# Patient Record
Sex: Female | Born: 1965
Health system: Southern US, Community
[De-identification: ages and names within clinical notes are randomized; demographics above are authoritative.]

## PROBLEM LIST (undated history)

## (undated) DIAGNOSIS — N920 Excessive and frequent menstruation with regular cycle: Secondary | ICD-10-CM

## (undated) DIAGNOSIS — T4145XA Adverse effect of unspecified anesthetic, initial encounter: Secondary | ICD-10-CM

## (undated) DIAGNOSIS — F419 Anxiety disorder, unspecified: Secondary | ICD-10-CM

## (undated) DIAGNOSIS — F329 Major depressive disorder, single episode, unspecified: Secondary | ICD-10-CM

## (undated) DIAGNOSIS — T8859XA Other complications of anesthesia, initial encounter: Secondary | ICD-10-CM

## (undated) DIAGNOSIS — G709 Myoneural disorder, unspecified: Secondary | ICD-10-CM

## (undated) DIAGNOSIS — N6081 Other benign mammary dysplasias of right breast: Secondary | ICD-10-CM

## (undated) DIAGNOSIS — D649 Anemia, unspecified: Secondary | ICD-10-CM

## (undated) DIAGNOSIS — O09293 Supervision of pregnancy with other poor reproductive or obstetric history, third trimester: Secondary | ICD-10-CM

## (undated) DIAGNOSIS — F32A Depression, unspecified: Secondary | ICD-10-CM

## (undated) DIAGNOSIS — Z0000 Encounter for general adult medical examination without abnormal findings: Secondary | ICD-10-CM

## (undated) DIAGNOSIS — M1990 Unspecified osteoarthritis, unspecified site: Secondary | ICD-10-CM

## (undated) DIAGNOSIS — E039 Hypothyroidism, unspecified: Secondary | ICD-10-CM

## (undated) DIAGNOSIS — R030 Elevated blood-pressure reading, without diagnosis of hypertension: Secondary | ICD-10-CM

## (undated) DIAGNOSIS — R946 Abnormal results of thyroid function studies: Secondary | ICD-10-CM

## (undated) DIAGNOSIS — N6310 Unspecified lump in the right breast, unspecified quadrant: Secondary | ICD-10-CM

## (undated) DIAGNOSIS — I493 Ventricular premature depolarization: Secondary | ICD-10-CM

## (undated) DIAGNOSIS — R011 Cardiac murmur, unspecified: Secondary | ICD-10-CM

## (undated) DIAGNOSIS — M5090 Cervical disc disorder, unspecified, unspecified cervical region: Secondary | ICD-10-CM

## (undated) DIAGNOSIS — K219 Gastro-esophageal reflux disease without esophagitis: Secondary | ICD-10-CM

## (undated) DIAGNOSIS — I10 Essential (primary) hypertension: Secondary | ICD-10-CM

## (undated) DIAGNOSIS — R9431 Abnormal electrocardiogram [ECG] [EKG]: Secondary | ICD-10-CM

## (undated) DIAGNOSIS — Z8616 Personal history of COVID-19: Secondary | ICD-10-CM

## (undated) DIAGNOSIS — I471 Supraventricular tachycardia: Secondary | ICD-10-CM

## (undated) DIAGNOSIS — E079 Disorder of thyroid, unspecified: Secondary | ICD-10-CM

## (undated) DIAGNOSIS — I4719 Other supraventricular tachycardia: Secondary | ICD-10-CM

## (undated) DIAGNOSIS — R42 Dizziness and giddiness: Secondary | ICD-10-CM

## (undated) DIAGNOSIS — D126 Benign neoplasm of colon, unspecified: Secondary | ICD-10-CM

## (undated) DIAGNOSIS — I491 Atrial premature depolarization: Secondary | ICD-10-CM

## (undated) DIAGNOSIS — N959 Unspecified menopausal and perimenopausal disorder: Secondary | ICD-10-CM

## (undated) HISTORY — DX: Hypothyroidism, unspecified: E03.9

## (undated) HISTORY — DX: Abnormal electrocardiogram (ECG) (EKG): R94.31

## (undated) HISTORY — DX: Dizziness and giddiness: R42

## (undated) HISTORY — PX: BLADDER REPAIR: SHX76

## (undated) HISTORY — DX: Abnormal results of thyroid function studies: R94.6

## (undated) HISTORY — PX: EYE SURGERY: SHX253

## (undated) HISTORY — DX: Excessive and frequent menstruation with regular cycle: N92.0

## (undated) HISTORY — DX: Benign neoplasm of colon, unspecified: D12.6

## (undated) HISTORY — DX: Elevated blood-pressure reading, without diagnosis of hypertension: R03.0

## (undated) HISTORY — DX: Encounter for general adult medical examination without abnormal findings: Z00.00

## (undated) HISTORY — DX: Ventricular premature depolarization: I49.3

## (undated) HISTORY — DX: Unspecified menopausal and perimenopausal disorder: N95.9

## (undated) HISTORY — DX: Disorder of thyroid, unspecified: E07.9

## (undated) HISTORY — DX: Essential (primary) hypertension: I10

## (undated) HISTORY — DX: Other supraventricular tachycardia: I47.19

## (undated) HISTORY — PX: TUBAL LIGATION: SHX77

## (undated) HISTORY — PX: APPENDECTOMY: SHX54

## (undated) HISTORY — DX: Atrial premature depolarization: I49.1

## (undated) HISTORY — DX: Cervical disc disorder, unspecified, unspecified cervical region: M50.90

## (undated) HISTORY — DX: Unspecified lump in the right breast, unspecified quadrant: N63.10

## (undated) HISTORY — DX: Supraventricular tachycardia: I47.1

## (undated) HISTORY — DX: Major depressive disorder, single episode, unspecified: F32.9

## (undated) HISTORY — DX: Other benign mammary dysplasias of right breast: N60.81

---

## 1995-12-21 DIAGNOSIS — O09293 Supervision of pregnancy with other poor reproductive or obstetric history, third trimester: Secondary | ICD-10-CM

## 1995-12-21 HISTORY — DX: Supervision of pregnancy with other poor reproductive or obstetric history, third trimester: O09.293

## 1998-04-17 ENCOUNTER — Inpatient Hospital Stay (HOSPITAL_COMMUNITY): Admission: AD | Admit: 1998-04-17 | Discharge: 1998-04-18 | Payer: Self-pay | Admitting: Obstetrics and Gynecology

## 1998-06-27 ENCOUNTER — Inpatient Hospital Stay (HOSPITAL_COMMUNITY): Admission: AD | Admit: 1998-06-27 | Discharge: 1998-07-01 | Payer: Self-pay | Admitting: *Deleted

## 1998-07-25 ENCOUNTER — Encounter (HOSPITAL_COMMUNITY): Admission: RE | Admit: 1998-07-25 | Discharge: 1998-10-16 | Payer: Self-pay | Admitting: *Deleted

## 1999-04-27 ENCOUNTER — Emergency Department (HOSPITAL_COMMUNITY): Admission: EM | Admit: 1999-04-27 | Discharge: 1999-04-27 | Payer: Self-pay | Admitting: Emergency Medicine

## 1999-04-27 ENCOUNTER — Encounter: Payer: Self-pay | Admitting: Emergency Medicine

## 2000-06-02 ENCOUNTER — Ambulatory Visit (HOSPITAL_COMMUNITY): Admission: RE | Admit: 2000-06-02 | Discharge: 2000-06-03 | Payer: Self-pay | Admitting: *Deleted

## 2000-07-21 ENCOUNTER — Other Ambulatory Visit: Admission: RE | Admit: 2000-07-21 | Discharge: 2000-07-21 | Payer: Self-pay | Admitting: *Deleted

## 2000-10-21 ENCOUNTER — Ambulatory Visit (HOSPITAL_COMMUNITY): Admission: RE | Admit: 2000-10-21 | Discharge: 2000-10-21 | Payer: Self-pay | Admitting: *Deleted

## 2000-10-21 ENCOUNTER — Encounter: Payer: Self-pay | Admitting: *Deleted

## 2002-07-04 ENCOUNTER — Other Ambulatory Visit: Admission: RE | Admit: 2002-07-04 | Discharge: 2002-07-04 | Payer: Self-pay | Admitting: *Deleted

## 2004-09-01 ENCOUNTER — Other Ambulatory Visit: Admission: RE | Admit: 2004-09-01 | Discharge: 2004-09-01 | Payer: Self-pay | Admitting: Obstetrics and Gynecology

## 2004-11-25 ENCOUNTER — Ambulatory Visit: Payer: Self-pay | Admitting: Internal Medicine

## 2004-12-29 ENCOUNTER — Ambulatory Visit: Payer: Self-pay | Admitting: Family Medicine

## 2005-09-21 ENCOUNTER — Ambulatory Visit: Payer: Self-pay | Admitting: Internal Medicine

## 2005-11-12 ENCOUNTER — Ambulatory Visit: Payer: Self-pay | Admitting: Internal Medicine

## 2006-08-18 ENCOUNTER — Ambulatory Visit: Payer: Self-pay | Admitting: Family Medicine

## 2006-08-25 ENCOUNTER — Ambulatory Visit: Payer: Self-pay | Admitting: Internal Medicine

## 2006-11-16 ENCOUNTER — Ambulatory Visit: Payer: Self-pay | Admitting: Internal Medicine

## 2007-02-01 ENCOUNTER — Other Ambulatory Visit: Admission: RE | Admit: 2007-02-01 | Discharge: 2007-02-01 | Payer: Self-pay | Admitting: Gynecology

## 2007-08-02 ENCOUNTER — Ambulatory Visit: Payer: Self-pay | Admitting: Internal Medicine

## 2007-08-10 ENCOUNTER — Encounter (INDEPENDENT_AMBULATORY_CARE_PROVIDER_SITE_OTHER): Payer: Self-pay | Admitting: *Deleted

## 2007-08-10 LAB — CONVERTED CEMR LAB: TSH: 1.16 microintl units/mL (ref 0.35–5.50)

## 2007-11-03 ENCOUNTER — Telehealth: Payer: Self-pay | Admitting: Internal Medicine

## 2008-01-12 ENCOUNTER — Telehealth (INDEPENDENT_AMBULATORY_CARE_PROVIDER_SITE_OTHER): Payer: Self-pay | Admitting: *Deleted

## 2008-04-16 ENCOUNTER — Encounter: Payer: Self-pay | Admitting: Internal Medicine

## 2008-04-26 ENCOUNTER — Telehealth (INDEPENDENT_AMBULATORY_CARE_PROVIDER_SITE_OTHER): Payer: Self-pay | Admitting: *Deleted

## 2008-04-26 ENCOUNTER — Ambulatory Visit: Payer: Self-pay | Admitting: Internal Medicine

## 2009-04-14 ENCOUNTER — Ambulatory Visit: Payer: Self-pay | Admitting: Internal Medicine

## 2009-04-14 DIAGNOSIS — R946 Abnormal results of thyroid function studies: Secondary | ICD-10-CM | POA: Insufficient documentation

## 2009-04-14 DIAGNOSIS — F329 Major depressive disorder, single episode, unspecified: Secondary | ICD-10-CM

## 2009-04-14 DIAGNOSIS — R9431 Abnormal electrocardiogram [ECG] [EKG]: Secondary | ICD-10-CM

## 2009-04-14 DIAGNOSIS — F3289 Other specified depressive episodes: Secondary | ICD-10-CM | POA: Insufficient documentation

## 2009-04-15 ENCOUNTER — Encounter: Payer: Self-pay | Admitting: Internal Medicine

## 2009-04-24 ENCOUNTER — Telehealth: Payer: Self-pay | Admitting: Internal Medicine

## 2010-07-09 ENCOUNTER — Ambulatory Visit: Payer: Self-pay | Admitting: Internal Medicine

## 2011-01-19 NOTE — Assessment & Plan Note (Signed)
Summary: cpx/cbs   Vital Signs:  Patient profile:   45 year old female Height:      61.75 inches Weight:      142.4 pounds BMI:     26.35 Temp:     98.7 degrees F oral Pulse rate:   72 / minute Resp:     14 per minute BP sitting:   128 / 80  (left arm) Cuff size:   regular  Vitals Entered By: Shonna Chock CMA (July 09, 2010 1:23 PM)  Comments Patient refused EKG, states "Im fine, Im healthy, I work out, no problems."   History of Present Illness: Stephanie Gray is here for a physical; she is asymptomatic.  Allergies: 1)  ! Pcn  Past History:  Past Medical History: Depression, Dr [name] Hypothyroidism HELP Syndrome (elevated LFTs, TCP) in context of  pneumonia  with 1st pregnancy, 1987  Past Surgical History: G2 P2 ( C sections); Lasik eye surgery bilaterally ; Appendectomy Tubal ligation & bladder repair  6 months post  partum  Family History: Father: HTN, psoriasis, MI @ 49, DM Mother: HTN, OA, migraines Siblings: sister :depression, migraines, dystonia; MGM: depression, Osteoporosis, MR; PGM: MI in 92s; PGuncle : MI ; PGGF :MI in 79s  Social History: Married Never Smoked Alcohol use-no Regular exercise-yes: manual labor on horsefarm  Review of Systems General:  Denies chills, fever, sleep disorder, and sweats; Weight down 6 # with heart healthy nutritional program. Eyes:  Denies blurring, double vision, and vision loss-both eyes. ENT:  Denies difficulty swallowing, hoarseness, nasal congestion, and sinus pressure. CV:  Denies chest pain or discomfort, leg cramps with exertion, palpitations, shortness of breath with exertion, swelling of feet, and swelling of hands. Resp:  Denies cough, shortness of breath, and wheezing. GI:  Denies abdominal pain, bloody stools, and dark tarry stools; Constipation with dehydration. GU:  Denies discharge, dysuria, and hematuria. MS:  Denies joint pain, joint redness, joint swelling, low back pain, mid back pain,  and thoracic pain; Plantar fasciitis resolved with arch supports. Derm:  Denies changes in nail beds, dryness, hair loss, lesion(s), and rash; Eczema with stress or excess handwashing. Neuro:  Denies numbness and tingling. Psych:  Denies anxiety and depression; Stable with meds. Endo:  Denies cold intolerance, excessive hunger, excessive thirst, excessive urination, and heat intolerance. Heme:  Denies abnormal bruising and bleeding. Allergy:  Denies itching eyes and sneezing.  Physical Exam  General:  well-nourished; alert,appropriate and cooperative throughout examination Head:  Normocephalic and atraumatic without obvious abnormalities.  Eyes:  No corneal or conjunctival inflammation noted. No lid lag .Perrla. Funduscopic exam benign, without hemorrhages, exudates or papilledema.  Ears:  External ear exam shows no significant lesions or deformities.  Otoscopic examination reveals clear canals, tympanic membranes are intact bilaterally without bulging, retraction, inflammation or discharge. Hearing is grossly normal bilaterally. Nose:  External nasal examination shows no deformity or inflammation. Nasal mucosa are pink and moist without lesions or exudates. Mouth:  Oral mucosa and oropharynx without lesions or exudates.  Teeth in good repair. Neck:  No deformities, masses, or tenderness noted. Physiologic asymmetry of thyroid Lungs:  Normal respiratory effort, chest expands symmetrically. Lungs are clear to auscultation, no crackles or wheezes. Heart:  normal rate, regular rhythm, no gallop, no rub, no JVD, no HJR, and grade 1 /6 systolic murmur LSB  Abdomen:  Bowel sounds positive,abdomen soft and non-tender without masses, organomegaly or hernias noted. Genitalia:  Dr Nicholas Lose Msk:  No deformity or scoliosis noted of thoracic  or lumbar spine.   Pulses:  R and L carotid,radial,dorsalis pedis and posterior tibial pulses are full and equal bilaterally Extremities:  No clubbing, cyanosis, edema,  or deformity noted with normal full range of motion of all joints.   Neurologic:  alert & oriented X3 and DTRs symmetrical and normal.   No tremor Skin:  Intact without suspicious lesions or rashes Cervical Nodes:  No lymphadenopathy noted Axillary Nodes:  No palpable lymphadenopathy Psych:  memory intact for recent and remote, normally interactive, and good eye contact.     Impression & Recommendations:  Problem # 1:  ROUTINE GENERAL MEDICAL EXAM@HEALTH  CARE FACL (ICD-V70.0)  Orders: EKG w/ Interpretation (93000)  Problem # 2:  HYPOTHYROIDISM (ICD-244.9)  Her updated medication list for this problem includes:    Synthroid 50 Mcg Tabs (Levothyroxine sodium) .Marland Kitchen... Take 1 tablet everyday-  Problem # 3:  ATHEROSCLEROTIC HEART DISEASE, FAMILY HX (ICD-V17.3)  Orders: EKG w/ Interpretation (93000)  Problem # 4:  NONSPECIFIC ABNORMAL ELECTROCARDIOGRAM (ICD-794.31) PMH of  Problem # 5:  DEPRESSION (ICD-311) as per Dr Nolen Mu Her updated medication list for this problem includes:    Cymbalta 30 Mg Cpep (Duloxetine hcl) .Marland Kitchen... Take one daily  Complete Medication List: 1)  Synthroid 50 Mcg Tabs (Levothyroxine sodium) .... Take 1 tablet everyday- 2)  Cymbalta 30 Mg Cpep (Duloxetine hcl) .... Take one daily  Patient Instructions: 1)  Please schedule fasting labs in 10 weeks: 2)  BMP ; 3)  Hepatic Panel ; 4)   NMR Lipoprofile Lipid Panel ; 5)  TSH ; 6)  CBC w/ Diff. Prescriptions: SYNTHROID 50 MCG  TABS (LEVOTHYROXINE SODIUM) take 1 tablet everyday- Brand medically necessary #90 x 3   Entered and Authorized by:   Marga Melnick MD   Signed by:   Marga Melnick MD on 07/09/2010   Method used:   Faxed to ...       [facility]. [phone]* (retail)       [address]       [place], [place]  [address]       Ph: [phone]       Fax: [phone]   RxID:   [id number]    Immunization History:  Tetanus/Td Immunization History:     Tetanus/Td:  tdap (04/20/2004)

## 2011-02-03 ENCOUNTER — Inpatient Hospital Stay (INDEPENDENT_AMBULATORY_CARE_PROVIDER_SITE_OTHER)
Admission: RE | Admit: 2011-02-03 | Discharge: 2011-02-03 | Disposition: A | Discharge status: Home / Self Care | Payer: BC Managed Care – PPO | Source: Ambulatory Visit | Attending: Family Medicine | Admitting: Family Medicine

## 2011-02-03 DIAGNOSIS — J069 Acute upper respiratory infection, unspecified: Secondary | ICD-10-CM

## 2011-02-03 DIAGNOSIS — J45909 Unspecified asthma, uncomplicated: Secondary | ICD-10-CM

## 2011-02-08 ENCOUNTER — Encounter: Payer: Self-pay | Admitting: Internal Medicine

## 2011-02-08 ENCOUNTER — Ambulatory Visit (INDEPENDENT_AMBULATORY_CARE_PROVIDER_SITE_OTHER): Payer: BC Managed Care – PPO | Admitting: Internal Medicine

## 2011-02-08 DIAGNOSIS — J209 Acute bronchitis, unspecified: Secondary | ICD-10-CM

## 2011-02-13 ENCOUNTER — Inpatient Hospital Stay (INDEPENDENT_AMBULATORY_CARE_PROVIDER_SITE_OTHER)
Admission: RE | Admit: 2011-02-13 | Discharge: 2011-02-13 | Disposition: A | Discharge status: Home / Self Care | Payer: BC Managed Care – PPO | Source: Ambulatory Visit | Attending: Family Medicine | Admitting: Family Medicine

## 2011-02-13 DIAGNOSIS — S058X9A Other injuries of unspecified eye and orbit, initial encounter: Secondary | ICD-10-CM

## 2011-02-16 NOTE — Assessment & Plan Note (Signed)
Summary: COUGH AND CHEST CONGESTION --PH   Vital Signs:  Patient profile:   45 year old female Weight:      147.8 pounds BMI:     27.35 O2 Sat:      98 % Temp:     98.8 degrees F oral Pulse rate:   74 / minute Resp:     14 per minute BP sitting:   134 / 88  (left arm) Cuff size:   large  Vitals Entered By: Shonna Chock CMA (February 08, 2011 1:48 PM) CC: Cough, congestion and wheezing , URI symptoms   CC:  Cough, congestion and wheezing , and URI symptoms.  History of Present Illness:    Onset 02/12 as throat tickling  followed by cough with thick yellow sputum & wheezing as of 02/14. Prednisone & albuterol MDI Rxed 02/14 @ [facility]. She now denies nasal congestion, purulent nasal discharge, and earache.  Associated symptoms include dyspnea and wheezing.  The patient denies fever.  The patient denies frontal  headache, bilateral facial pain, tooth pain, and tender adenopathy.    Current Medications (verified): 1)  Synthroid 50 Mcg  Tabs (Levothyroxine Sodium) .... Take 1 Tablet Everyday**labs Due Now** 2)  Cymbalta 30 Mg  Cpep (Duloxetine Hcl) .... Take One Daily 3)  Proair Hfa 108 (90 Base) Mcg/act Aers (Albuterol Sulfate) .... As Directed  Allergies: 1)  ! Pcn  Physical Exam  General:  well-nourished,in no acute distress; alert,appropriate and cooperative throughout examination Ears:  External ear exam shows no significant lesions or deformities.  Otoscopic examination reveals clear canals, tympanic membranes are intact bilaterally without bulging, retraction, inflammation or discharge. Hearing is grossly normal bilaterally. Nose:  External nasal examination shows no deformity or inflammation. Nasal mucosa are pink and moist without lesions or exudates. Mouth:  Oral mucosa and oropharynx without lesions or exudates.  Teeth in good repair. Lungs:  Normal respiratory effort, chest expands symmetrically. Lungs  : musical rhonchi &  wheezes. Heart:  Normal rate and regular rhythm.  S1 and S2 normal without gallop, murmur, click, rub or other extra sounds. Cervical Nodes:  No lymphadenopathy noted Axillary Nodes:  No palpable lymphadenopathy   Impression & Recommendations:  Problem # 1:  BRONCHITIS-ACUTE (ICD-466.0) RAD component Her updated medication list for this problem includes:    Proair Hfa 108 (90 Base) Mcg/act Aers (Albuterol sulfate) .Marland Kitchen... As directed    Symbicort 160-4.5 Mcg/act Aero (Budesonide-formoterol fumarate) .Marland Kitchen... 1-2 puffs every 12 hrs ; gargle & spit after use    Azithromycin 250 Mg Tabs (Azithromycin) .Marland Kitchen... As per pack    Hydromet 5-1.5 Mg/56ml Syrp (Hydrocodone-homatropine) .Marland Kitchen... 1 tsp every 6 hrs as needed  Complete Medication List: 1)  Synthroid 50 Mcg Tabs (Levothyroxine sodium) .... Take 1 tablet everyday**labs due now** 2)  Cymbalta 30 Mg Cpep (Duloxetine hcl) .... Take one daily 3)  Proair Hfa 108 (90 Base) Mcg/act Aers (Albuterol sulfate) .... As directed 4)  Symbicort 160-4.5 Mcg/act Aero (Budesonide-formoterol fumarate) .Marland Kitchen.. 1-2 puffs every 12 hrs ; gargle & spit after use 5)  Prednisone 20 Mg Tabs (Prednisone) .Marland Kitchen.. 1 two times a day with a meal 6)  Azithromycin 250 Mg Tabs (Azithromycin) .... As per pack 7)  Hydromet 5-1.5 Mg/72ml Syrp (Hydrocodone-homatropine) .Marland Kitchen.. 1 tsp every 6 hrs as needed  Patient Instructions: 1)  Drink as much  NON dairy fluid as you can tolerate for the next few days. Prescriptions: HYDROMET 5-1.5 MG/5ML SYRP (HYDROCODONE-HOMATROPINE) 1 tsp every 6 hrs as needed  #  90 cc x 0   Entered and Authorized by:   Marga Melnick MD   Signed by:   Marga Melnick MD on 02/08/2011   Method used:   Print then Give to Patient   RxID:   [id number] AZITHROMYCIN 250 MG TABS (AZITHROMYCIN) as per pack  #1 x 0   Entered and Authorized by:   Marga Melnick MD   Signed by:   Marga Melnick MD on 02/08/2011   Method used:   Electronically to        [facility]. [phone]* (retail)       [address]       [place], [place]  [address]       Ph: [phone]       Fax: [phone]   RxID:   [id number] PREDNISONE 20 MG TABS (PREDNISONE) 1 two times a day with a meal  #14 x 0   Entered and Authorized by:   Marga Melnick MD   Signed by:   Marga Melnick MD on 02/08/2011   Method used:   Electronically to        [facility]. [phone]* (retail)       [address]       [place], [place]  [address]       Ph: [phone]       Fax: [phone]   RxID:   [id number] SYMBICORT 160-4.5 MCG/ACT AERO (BUDESONIDE-FORMOTEROL FUMARATE) 1-2 puffs every 12 hrs ; gargle & spit after use  #1 x 11   Entered and Authorized by:   Marga Melnick MD   Signed by:   Marga Melnick MD on 02/08/2011   Method used:   Print then Give to Patient   RxID:   [id number] PROAIR HFA 108 (90 BASE) MCG/ACT AERS (ALBUTEROL SULFATE) as directed  #1 x 2   Entered and Authorized by:   Marga Melnick MD   Signed by:   Marga Melnick MD on 02/08/2011   Method used:   Print then Give to Patient   RxID:   [id number]    Orders Added: 1)  Est. Patient Level III [03500]

## 2011-05-07 NOTE — Op Note (Signed)
[facility]  Patient:    Stephanie Gray, Stephanie Gray                 MRN: [id number] Proc. Date: 10/21/00 Adm. Date:  [id number] Disc. Date: [id number] Attending:  Donne Hazel                           Operative Report  PREOPERATIVE DIAGNOSES:       1. Request for permanent, voluntary                                  sterilization.                               2. Pelvic pain.  POSTOPERATIVE DIAGNOSES:      1. Request for permanent, voluntary                                  sterilization.                               2. Pelvic pain.                               3. Severe pelvic adhesions noted.                               4. Incidental cystostomy.  OPERATION/PROCEDURE:          1. Laparoscopy.                               2. Lysis of adhesions.                               3. Bilateral tubal ligation.                               4. Mini laparotomy with repair of cystotomy.  SURGEON:                      Willey Blade, M.D.  ANESTHESIA:                   General endotracheal anesthesia.  ESTIMATED BLOOD LOSS:         Estimated blood loss was 20 cc.  COMPLICATIONS:                Incidental cystostomy occurred at the time of lysis of adhesions.  A mini laparotomy was thus performed and a repair undertaken of the dome of the bladder.  FINDINGS AT SURGERY:          Bilateral tubal ligation was performed and there was a great deal of scarring in the anterior abdominal wall and lower uterine segment from the patients previous cesarean section deliveries.  During lysis of adhesions the bladder was sharply entered and later repaired.  The ovaries were visualized bilaterally and noted to be normal.  The  bowel was briefly visualized and noted to be normal as well.                                The appendix was not visualized.  A Foley catheter was placed at the end of the procedure to be left x 10 days.  DESCRIPTION OF PROCEDURE:     The  patient was taken to the operating room and general endotracheal anesthesia administered.  The patient was placed on the operating room table in the dorsal lithotomy position and the abdomen, perineum, and vagina prepped with Betadine and draped in the usual sterile fashion with sterile drapes.  The bladder was emptied with a red rubber catheter.  Next, a small vertical infraumbilical skin incision was made, through which a Veress needle was inserted atraumatically into the abdominal cavity.  A pneumoperitoneum was then created with two liters of carbon dioxide gas.  The Veress needle was removed and a disposable laparoscopic trocar was inserted atraumatically into the abdominal cavity.  A Hulka tenaculum was placed inside the intrauterine cavity prior to this for uterine manipulation. First, attention was turned to bilateral tubal ligation.  As mentioned, there was a great deal of scar tissue noted between the bladder, anterior abdominal wall, and lower uterine segment.  This was extremely distorted due to her previous cesarean section deliveries.  Next, tubal ligation was carried out with the bipolar Klepinger cautery device.  The left tube was identified and traced to its fimbriated end to ensure its positive identification.  The tube was then grasped approximately 2 cm away from the uterine fundus and cauterized thoroughly and completely, and incorporating some of the mesosalpinx for a distance of about 2.5 cm.  The same procedure was repeated on the right tube.  Due to pelvic pain a second puncture was placed two fingerbreadths above the pubic symphysis and in the midline.  Through this a 5 mm trocar was inserted under direct, continuous laparoscopic guidance.  A probe was placed.  Lysis of adhesions was then attempted to be carried out. This was done with blunt and sharp dissection.  As mentioned, the bladder was entered in the dome in the medical sharply, with lysis of adhesions.  Due  to this, a small mini laparotomy was thus performed.  This was about 4 cm in size.                                A 4 cm incision was made in the patients previous Pfannenstiel incision line in the midline and this was carried down sharply in the usual fashion.  The peritoneum was atraumatically entered.  An approximate 2 cm segment of cystotomy was next repaired in a three layer fashion.  The bladder was visualized and noted to be without further injury. The bladder mucosa was then reapproximated with running stitch of 3-0 chromic suture.  The muscularis was then closed two layers above the mucosal layer with two running stitches of 3-0 Vicryl.  A - was then inserted and infiltrated into the bladder. It was noted to be intact and sealed nicely with closure.  A Foley catheter was then placed.  Attention was then turned to closure.  All gas was allowed to escape from the abdomen of course with the incision.  The umbilical site was closed first with two interrupted sutures of 0 Vicryl on the  muscle fascia and the muscle fascia was then closed with a suprapubic small laparotomy incision with a running stitch of 0 Vicryl as well.  All vaginal instruments were removed.  The subcutaneous tissue was closed with multiple interrupted sutures of 0 Vicryl as well.  The skin was reapproximated with Dermabond glue.                                An indwelling catheter was placed.  Also at the time of cystotomy an antibiotic was given.  The patient was then awakened, extubated, and taken to the recovery room in good condition.  The indwelling Foley remained.  There were no further problems or complications. DD:  11/04/00 TD:  11/04/00 Job: [id number]

## 2011-08-16 ENCOUNTER — Other Ambulatory Visit: Payer: Self-pay | Admitting: Internal Medicine

## 2011-08-16 NOTE — Telephone Encounter (Signed)
TSH 244.0

## 2011-09-30 ENCOUNTER — Other Ambulatory Visit: Payer: Self-pay | Admitting: Internal Medicine

## 2011-11-25 ENCOUNTER — Other Ambulatory Visit: Payer: Self-pay | Admitting: Gynecology

## 2011-11-25 DIAGNOSIS — N644 Mastodynia: Secondary | ICD-10-CM

## 2011-11-25 DIAGNOSIS — N63 Unspecified lump in unspecified breast: Secondary | ICD-10-CM

## 2011-11-26 ENCOUNTER — Other Ambulatory Visit: Payer: BC Managed Care – PPO

## 2011-11-29 ENCOUNTER — Other Ambulatory Visit: Payer: Self-pay | Admitting: Gynecology

## 2011-11-29 ENCOUNTER — Ambulatory Visit
Admission: RE | Admit: 2011-11-29 | Discharge: 2011-11-29 | Disposition: A | Discharge status: Home / Self Care | Payer: BC Managed Care – PPO | Source: Ambulatory Visit | Attending: Gynecology | Admitting: Gynecology

## 2011-11-29 DIAGNOSIS — N63 Unspecified lump in unspecified breast: Secondary | ICD-10-CM

## 2011-11-29 DIAGNOSIS — N644 Mastodynia: Secondary | ICD-10-CM

## 2011-12-09 ENCOUNTER — Ambulatory Visit (INDEPENDENT_AMBULATORY_CARE_PROVIDER_SITE_OTHER): Payer: BC Managed Care – PPO | Admitting: Family Medicine

## 2011-12-09 ENCOUNTER — Encounter: Payer: Self-pay | Admitting: Family Medicine

## 2011-12-09 VITALS — BP 120/72 | HR 104 | Temp 99.5°F | Wt 150.4 lb

## 2011-12-09 DIAGNOSIS — J101 Influenza due to other identified influenza virus with other respiratory manifestations: Secondary | ICD-10-CM

## 2011-12-09 DIAGNOSIS — R509 Fever, unspecified: Secondary | ICD-10-CM

## 2011-12-09 DIAGNOSIS — J111 Influenza due to unidentified influenza virus with other respiratory manifestations: Secondary | ICD-10-CM

## 2011-12-09 LAB — POCT INFLUENZA A/B
Influenza A, POC: POSITIVE
Influenza B, POC: NEGATIVE

## 2011-12-09 MED ORDER — OSELTAMIVIR PHOSPHATE 75 MG PO CAPS: 75.0000 mg | ORAL_CAPSULE | Freq: Two times a day (BID) | ORAL | Status: AC

## 2011-12-09 NOTE — Patient Instructions (Signed)
Influenza Facts Flu (influenza) is a contagious respiratory illness caused by the influenza viruses. It can cause mild to severe illness. While most healthy people recover from the flu without specific treatment and without complications, older people, young children, and people with certain health conditions are at higher risk for serious complications from the flu, including death. CAUSES   The flu virus is spread from person to person by respiratory droplets from coughing and sneezing.   A person can also become infected by touching an object or surface with a virus on it and then touching their mouth, eye or nose.   Adults may be able to infect others from 1 day before symptoms occur and up to 7 days after getting sick. So it is possible to give someone the flu even before you know you are sick and continue to infect others while you are sick.  SYMPTOMS   Fever (usually high).   Headache.   Tiredness (can be extreme).   Cough.   Sore throat.   Runny or stuffy nose.   Body aches.   Diarrhea and vomiting may also occur, particularly in children.   These symptoms are referred to as "flu-like symptoms". A lot of different illnesses, including the common cold, can have similar symptoms.  DIAGNOSIS   There are tests that can determine if you have the flu as long you are tested within the first 2 or 3 days of illness.   A doctor's exam and additional tests may be needed to identify if you have a disease that is a complicating the flu.  RISKS AND COMPLICATIONS  Some of the complications caused by the flu include:  Bacterial pneumonia or progressive pneumonia caused by the flu virus.   Loss of body fluids (dehydration).   Worsening of chronic medical conditions, such as heart failure, asthma, or diabetes.   Sinus problems and ear infections.  HOME CARE INSTRUCTIONS   Seek medical care early on.   If you are at high risk from complications of the flu, consult your health-care  provider as soon as you develop flu-like symptoms. Those at high risk for complications include:   People 65 years or older.   People with chronic medical conditions, including diabetes.   Pregnant women.   Young children.   Your caregiver may recommend use of an antiviral medication to help treat the flu.   If you get the flu, get plenty of rest, drink a lot of liquids, and avoid using alcohol and tobacco.   You can take over-the-counter medications to relieve the symptoms of the flu if your caregiver approves. (Never give aspirin to children or teenagers who have flu-like symptoms, particularly fever).  PREVENTION  The single best way to prevent the flu is to get a flu vaccine each fall. Other measures that can help protect against the flu are:  Antiviral Medications   A number of antiviral drugs are approved for use in preventing the flu. These are prescription medications, and a doctor should be consulted before they are used.   Habits for Good Health   Cover your nose and mouth with a tissue when you cough or sneeze, throw the tissue away after you use it.   Wash your hands often with soap and water, especially after you cough or sneeze. If you are not near water, use an alcohol-based hand cleaner.   Avoid people who are sick.   If you get the flu, stay home from work or school. Avoid contact with   other people so that you do not make them sick, too.   Try not to touch your eyes, nose, or mouth as germs ore often spread this way.  IN CHILDREN, EMERGENCY WARNING SIGNS THAT NEED URGENT MEDICAL ATTENTION:  Fast breathing or trouble breathing.   Bluish skin color.   Not drinking enough fluids.   Not waking up or not interacting.   Being so irritable that the child does not want to be held.   Flu-like symptoms improve but then return with fever and worse cough.   Fever with a rash.  IN ADULTS, EMERGENCY WARNING SIGNS THAT NEED URGENT MEDICAL ATTENTION:  Difficulty  breathing or shortness of breath.   Pain or pressure in the chest or abdomen.   Sudden dizziness.   Confusion.   Severe or persistent vomiting.  SEEK IMMEDIATE MEDICAL CARE IF:  You or someone you know is experiencing any of the symptoms above. When you arrive at the emergency center,report that you think you have the flu. You may be asked to wear a mask and/or sit in a secluded area to protect others from getting sick. MAKE SURE YOU:   Understand these instructions.   Monitor your condition.   Seek medical care if you are getting worse, or not improving.  Document Released: 12/09/2003 Document Revised: 08/18/2011 Document Reviewed: 09/04/2009 [employer] Patient Information 2012 [employer]. 

## 2011-12-10 ENCOUNTER — Encounter: Payer: Self-pay | Admitting: Family Medicine

## 2011-12-10 NOTE — Progress Notes (Signed)
  Subjective:     Stephanie Gray is a 45 y.o. female here for evaluation of a cough. Onset of symptoms was several days ago. Symptoms have been gradually getting worse since that time. The cough is prouctive and is aggravated by activity and lying down.  . Associated symptoms include: body aches and fevers.   Patient does not have a history of asthma. Patient does not have a history of environmental allergens. Patient has not traveled recently. Patient does not have a history of smoking. Patient has not had a previous chest x-ray. Patient has not had a PPD done   Review of Systems Review of Systems  Constitutional: + prod cough, fever and body aches  HENT: Negative for hearing loss, congestion, tinnitus and ear discharge.   Eyes: Negative for visual disturbance  Respiratory:  cough  Cardiovascular: Negative for chest pain, palpitations and leg swelling.  Gastrointestinal: Negative for abdominal pain, diarrhea, constipation and abdominal distention.  Genitourinary: Negative for urgency, frequency, decreased urine volume and difficulty urinating.  Musculoskeletal: Negative for back pain, arthralgias and gait problem.  Skin: Negative for color change, pallor and rash.  Neurological: Negative for dizziness, light-headedness, numbness and headaches.  Hematological: Negative for adenopathy. Does not bruise/bleed easily.  Psychiatric/Behavioral: Negative for suicidal ideas, confusion, sleep disturbance, self-injury, dysphoric mood, decreased concentration and agitation.        Objective:    O2 sat  98% on RA BP 120/72  Pulse 104  Temp(Src) 99.5 F (37.5 C) (Oral)  Wt 150 lb 6.4 oz (68.221 kg)  SpO2 98%  General Appearance:  + mild distress,  Resting on exam table  Head:   NCAT  Eyes:  Perla, eomi  Ears:  Tmi, b/l  Nose:  turb errythematous,  And swollen  Throat:  pnd,  + errythema  Neck:  supple, no adenopathy  Back:     NA  Lungs:   Ctab/l   Chest Wall:  na   Heart:    +S1S2  Breast Exam:   NA  Abdomen:   Soft , nt  Genitalia:  na  Rectal:  na  Extremities:  no CCE  Pulses: normal  Skin:  no rashes,  warm  Lymph nodes:  none enlarged  Neurologic:   AAO x 3      Assessment:    Influenza--tamilflu,  Tylenol, advil , rest, fluids etc ,  rto prn   Plan:    see above

## 2011-12-20 ENCOUNTER — Other Ambulatory Visit: Payer: BC Managed Care – PPO

## 2011-12-24 ENCOUNTER — Other Ambulatory Visit: Payer: BC Managed Care – PPO

## 2011-12-27 ENCOUNTER — Other Ambulatory Visit: Payer: Self-pay | Admitting: Internal Medicine

## 2011-12-27 NOTE — Telephone Encounter (Signed)
TSH 244.9 

## 2012-01-06 ENCOUNTER — Other Ambulatory Visit: Payer: BC Managed Care – PPO

## 2012-02-10 ENCOUNTER — Other Ambulatory Visit: Payer: Self-pay | Admitting: Internal Medicine

## 2012-02-11 NOTE — Telephone Encounter (Signed)
TSH 244.9 

## 2012-02-21 ENCOUNTER — Other Ambulatory Visit: Payer: Self-pay | Admitting: Gynecology

## 2012-02-21 DIAGNOSIS — N63 Unspecified lump in unspecified breast: Secondary | ICD-10-CM

## 2012-02-21 DIAGNOSIS — N644 Mastodynia: Secondary | ICD-10-CM

## 2012-02-21 DIAGNOSIS — N6452 Nipple discharge: Secondary | ICD-10-CM

## 2012-02-25 ENCOUNTER — Ambulatory Visit
Admission: RE | Admit: 2012-02-25 | Discharge: 2012-02-25 | Disposition: A | Discharge status: Home / Self Care | Payer: BC Managed Care – PPO | Source: Ambulatory Visit | Attending: Gynecology | Admitting: Gynecology

## 2012-02-25 DIAGNOSIS — N644 Mastodynia: Secondary | ICD-10-CM

## 2012-02-25 DIAGNOSIS — N63 Unspecified lump in unspecified breast: Secondary | ICD-10-CM

## 2012-02-25 DIAGNOSIS — N6452 Nipple discharge: Secondary | ICD-10-CM

## 2012-03-16 ENCOUNTER — Other Ambulatory Visit: Payer: Self-pay | Admitting: Internal Medicine

## 2012-04-18 ENCOUNTER — Other Ambulatory Visit: Payer: Self-pay | Admitting: Internal Medicine

## 2012-04-19 NOTE — Telephone Encounter (Signed)
TSH 244.9 

## 2012-05-18 ENCOUNTER — Other Ambulatory Visit: Payer: Self-pay | Admitting: Internal Medicine

## 2012-05-18 NOTE — Telephone Encounter (Signed)
TSH 244.8 

## 2012-07-07 ENCOUNTER — Other Ambulatory Visit: Payer: Self-pay | Admitting: Internal Medicine

## 2012-08-10 ENCOUNTER — Other Ambulatory Visit: Payer: Self-pay | Admitting: Internal Medicine

## 2012-08-10 NOTE — Telephone Encounter (Signed)
Appointment 08/2012

## 2012-09-06 ENCOUNTER — Encounter: Payer: BC Managed Care – PPO | Admitting: Internal Medicine

## 2012-09-07 ENCOUNTER — Encounter: Payer: BC Managed Care – PPO | Admitting: Internal Medicine

## 2012-10-19 ENCOUNTER — Telehealth: Payer: Self-pay

## 2012-10-30 ENCOUNTER — Ambulatory Visit (INDEPENDENT_AMBULATORY_CARE_PROVIDER_SITE_OTHER): Payer: BC Managed Care – PPO | Admitting: Internal Medicine

## 2012-10-30 ENCOUNTER — Encounter: Payer: Self-pay | Admitting: Internal Medicine

## 2012-10-30 VITALS — BP 118/82 | HR 69 | Temp 98.5°F | Resp 12 | Ht 63.0 in | Wt 138.8 lb

## 2012-10-30 DIAGNOSIS — M7541 Impingement syndrome of right shoulder: Secondary | ICD-10-CM

## 2012-10-30 DIAGNOSIS — Z23 Encounter for immunization: Secondary | ICD-10-CM

## 2012-10-30 DIAGNOSIS — M25819 Other specified joint disorders, unspecified shoulder: Secondary | ICD-10-CM

## 2012-10-30 DIAGNOSIS — M7542 Impingement syndrome of left shoulder: Secondary | ICD-10-CM

## 2012-10-30 DIAGNOSIS — Z0000 Encounter for general adult medical examination without abnormal findings: Secondary | ICD-10-CM

## 2012-10-30 NOTE — Patient Instructions (Addendum)
Preventive Health Care: Exercise  30-45  minutes a day, 3-4 days a week.  Eat a low-fat diet with lots of fruits and vegetables, up to 7-9 servings per day.  Consume less than 30 grams (preferably ZERO) of sugar per day from foods & drinks with High Fructose Corn Syrup as #1,2,3 or #4 on label. If you activate My Chart; the results can be released to you as soon as they populate from the lab. If you choose not to use this program; the labs have to be reviewed, copied & mailed causing a delay in getting the results to you.

## 2012-10-30 NOTE — Progress Notes (Signed)
Subjective:    Patient ID: Stephanie Gray, female    DOB: 1966/10/26, 46 y.o.   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 On Nutrisystem she has lost 20# (size 10 to size 6); she is engaged in cardiovascular exercise 5 days a week riding horses.       Review of Systems  Extremity pain Location:R shoulder Onset:6 mos ago; initially on L  > R Trigger/injury:no Pain quality:burning Pain severity:up to 8 Duration:constant Radiation:to L hand Exacerbating factors:in LLDP with arms above head Treatment/response: NSAIDS helped  Constitutional: no fever or chills. Intermittent night sweats Musculoskeletal:no  muscle cramps or pain; no  joint stiffness, redness, or swelling Skin:no rash, color/ temp change Neuro: no weakness; incontinence (stool/urine); numbness and tingling Heme:no lymphadenopathy; abnormal bruising or bleeding    .    Objective:   Physical Exam Gen.: Healthy and well-nourished in appearance. Alert, appropriate and cooperative throughout exam. Head: Normocephalic without obvious abnormalities  Eyes: No corneal or conjunctival inflammation noted. Pupils equal round reactive to light and accommodation. Fundal exam is benign without hemorrhages, exudate, papilledema. Extraocular motion intact. Vision grossly normal. Ears: External  ear exam reveals no significant lesions or deformities. Canals clear .TMs normal. Hearing is grossly normal bilaterally. Nose: External nasal exam reveals no deformity or inflammation. Nasal mucosa are pink and moist. No lesions or exudates noted.  Mouth: Oral mucosa and oropharynx reveal no lesions or exudates. Teeth in good repair. Neck: No deformities, masses, or tenderness noted. Range of motion & Thyroid normal Lungs: Normal respiratory effort; chest expands symmetrically. Lungs are clear to auscultation without rales, wheezes, or increased work of breathing. Heart:  Normal rate and rhythm. Normal S1 and S2. No gallop, click, or rub. Grade 1/6 systolic murmur LSB Abdomen: Bowel sounds normal; abdomen soft and nontender. No masses, organomegaly or hernias noted. Genitalia: as per Gyn                                                                                 Musculoskeletal/extremities: No deformity or scoliosis noted of  the thoracic or lumbar spine. No clubbing, cyanosis, edema, or deformity noted. Range of motion  normal .Tone & strength  normal.Joints normal. Nail health  Good. Crepitus of L shoulder Vascular: Carotid, radial artery, dorsalis pedis and  posterior tibial pulses are full and equal. No bruits present. Neurologic: Alert and oriented x3. Deep tendon reflexes symmetrical and normal.          Skin: Intact without suspicious lesions or rashes. Lymph: No cervical, axillary lymphadenopathy present. Psych: Mood and affect are normal. Normally interactive                                                                                         Assessment & Plan:  #  1 comprehensive physical exam; no acute findings #2 shoulder impingement syndrome Plan: see Orders

## 2012-10-31 LAB — HEPATIC FUNCTION PANEL
ALT: 18 U/L (ref 0–35)
AST: 21 U/L (ref 0–37)
Albumin: 4 g/dL (ref 3.5–5.2)
Alkaline Phosphatase: 52 U/L (ref 39–117)
Bilirubin, Direct: 0.1 mg/dL (ref 0.0–0.3)
Total Bilirubin: 0.4 mg/dL (ref 0.3–1.2)
Total Protein: 6.5 g/dL (ref 6.0–8.3)

## 2012-10-31 LAB — CBC WITH DIFFERENTIAL/PLATELET
Basophils Absolute: 0.1 10*3/uL (ref 0.0–0.1)
Eosinophils Absolute: 0.3 10*3/uL (ref 0.0–0.7)
Eosinophils Relative: 3.2 % (ref 0.0–5.0)
MCV: 89.2 fl (ref 78.0–100.0)
Monocytes Absolute: 0.5 10*3/uL (ref 0.1–1.0)
Neutrophils Relative %: 62.1 % (ref 43.0–77.0)
Platelets: 208 10*3/uL (ref 150.0–400.0)
RDW: 13.8 % (ref 11.5–14.6)
WBC: 8.5 10*3/uL (ref 4.5–10.5)

## 2012-10-31 LAB — LIPID PANEL
Cholesterol: 144 mg/dL (ref 0–200)
HDL: 63.8 mg/dL (ref >39.00)
LDL Cholesterol: 59 mg/dL (ref 0–99)
Total CHOL/HDL Ratio: 2
Triglycerides: 105 mg/dL (ref 0.0–149.0)
VLDL: 21 mg/dL (ref 0.0–40.0)

## 2012-10-31 LAB — BASIC METABOLIC PANEL WITH GFR
BUN: 14 mg/dL (ref 6–23)
CO2: 29 meq/L (ref 19–32)
Calcium: 8.8 mg/dL (ref 8.4–10.5)
Chloride: 103 meq/L (ref 96–112)
Creatinine, Ser: 0.7 mg/dL (ref 0.4–1.2)
GFR: 91.93 mL/min
Glucose, Bld: 82 mg/dL (ref 70–99)
Potassium: 3.9 meq/L (ref 3.5–5.1)
Sodium: 138 meq/L (ref 135–145)

## 2012-10-31 LAB — TSH: TSH: 2.58 u[IU]/mL (ref 0.35–5.50)

## 2012-11-23 ENCOUNTER — Ambulatory Visit: Payer: BC Managed Care – PPO | Admitting: Sports Medicine

## 2013-01-02 ENCOUNTER — Ambulatory Visit: Payer: BC Managed Care – PPO | Admitting: Sports Medicine

## 2013-02-05 ENCOUNTER — Other Ambulatory Visit: Payer: Self-pay | Admitting: Internal Medicine

## 2013-10-17 ENCOUNTER — Ambulatory Visit (INDEPENDENT_AMBULATORY_CARE_PROVIDER_SITE_OTHER): Payer: BC Managed Care – PPO | Admitting: Internal Medicine

## 2013-10-17 ENCOUNTER — Encounter: Payer: Self-pay | Admitting: Internal Medicine

## 2013-10-17 VITALS — BP 149/92 | HR 74 | Temp 98.9°F | Wt 145.4 lb

## 2013-10-17 DIAGNOSIS — R946 Abnormal results of thyroid function studies: Secondary | ICD-10-CM

## 2013-10-17 DIAGNOSIS — B0229 Other postherpetic nervous system involvement: Secondary | ICD-10-CM

## 2013-10-17 MED ORDER — FAMCICLOVIR 500 MG PO TABS: 500.0000 mg | ORAL_TABLET | Freq: Three times a day (TID) | ORAL | Status: DC

## 2013-10-17 MED ORDER — GABAPENTIN 100 MG PO CAPS: ORAL_CAPSULE | ORAL | Status: DC

## 2013-10-17 NOTE — Patient Instructions (Signed)
Your next office appointment will be determined based upon review of your pending labs. Those instructions will be transmitted to you through My Chart . Please report any significant change in your symptoms. 

## 2013-10-17 NOTE — Progress Notes (Signed)
  Subjective:    Patient ID: Stephanie Gray, female    DOB: Jun 03, 1966, 47 y.o.   MRN: [id number]  HPI  Symptoms began 10/08/13 [place]  A nodule appeared along the right jaw line which was painful to touch. She developed swelling of the right side of her face followed by vesicles @ anterior chin.  Associated has been some discomfort in the right ear .The symptoms have impacted her eating due to the discomfort. The pain was intermittently burning in addition to a dull, aching pain along the jawline. There was itching over the right side of the face. Subsequent to the symptoms she developed a sore inside the mouth on the right.  She has had associated fever, chills, sweats.  She used Advil 400 mg as needed along with Benadryl and Anbesol with some response.   Review of Systems   She denies signs of rhinosinusitis such as frontal headache, facial pain, nasal purulence, or otic discharge.     Objective:   Physical Exam  General appearance:well nourished; no acute distress or increased work of breathing is present.  No  lymphadenopathy about the head, neck, or axilla noted.   Eyes: No conjunctival inflammation or lid edema is present. There is no scleral icterus. Extraocular motion is intact. Field of vision and visual to confrontation normal.  Ears:  External ear exam shows no significant lesions or deformities.  Otoscopic examination reveals some wax on the right. Whisper heard at 6 feet  Nose:  External nasal examination shows no deformity or inflammation. Nasal mucosa are pink and moist without lesions or exudates. No septal dislocation or deviation.No obstruction to airflow.   Oral exam: Dental hygiene is good; lips and gums are healthy appearing.oral pharyngeal erythema on the right with a shallow ulcer of the hard palate. Palpable nodule right mid mandibular area which is tender to palpation Neck:  No deformities,  masses, or  tenderness noted.   Supple with full range of motion without pain.   Heart:  Normal rate and regular rhythm. S1 and S2 normal without gallop, murmur, click, rub or other extra sounds.   Lungs:Chest clear to auscultation; no wheezes, rhonchi,rales ,or rubs present.No increased work of breathing.    Extremities:  No cyanosis, edema, or clubbing  noted    Skin: Matted vesicles to the right of midline over the anterior chin.         Assessment & Plan:  #1 herpes zoster C2 distribution #2abnormal TFT See orders

## 2013-10-18 LAB — CBC WITH DIFFERENTIAL/PLATELET
Basophils Relative: 0.3 % (ref 0.0–3.0)
Eosinophils Absolute: 0.2 10*3/uL (ref 0.0–0.7)
Eosinophils Relative: 3.1 % (ref 0.0–5.0)
Lymphocytes Relative: 29.5 % (ref 12.0–46.0)
MCHC: 34 g/dL (ref 30.0–36.0)
Neutrophils Relative %: 62.4 % (ref 43.0–77.0)
RBC: 4.39 Mil/uL (ref 3.87–5.11)
WBC: 7.3 10*3/uL (ref 4.5–10.5)

## 2013-10-19 ENCOUNTER — Encounter: Payer: Self-pay | Admitting: *Deleted

## 2013-10-19 NOTE — Progress Notes (Signed)
Letter mailed to patient.

## 2014-01-25 ENCOUNTER — Other Ambulatory Visit: Payer: Self-pay | Admitting: Internal Medicine

## 2014-01-25 NOTE — Telephone Encounter (Signed)
Rx sent to the pharmacy by e-script.//AB/CMA 

## 2014-12-18 ENCOUNTER — Other Ambulatory Visit: Payer: Self-pay | Admitting: Internal Medicine

## 2015-01-30 ENCOUNTER — Other Ambulatory Visit: Payer: Self-pay | Admitting: Internal Medicine

## 2015-03-24 ENCOUNTER — Other Ambulatory Visit: Payer: Self-pay | Admitting: Internal Medicine

## 2015-03-27 ENCOUNTER — Telehealth: Payer: Self-pay | Admitting: Internal Medicine

## 2015-03-27 MED ORDER — SYNTHROID 50 MCG PO TABS: 50.0000 ug | ORAL_TABLET | Freq: Every morning | ORAL | Status: DC

## 2015-03-27 NOTE — Telephone Encounter (Signed)
Patient needs refill for SYNTHROID 50 MCG tablet [[id number]. Pharmacy is [facility] on [address]

## 2015-03-31 ENCOUNTER — Ambulatory Visit (INDEPENDENT_AMBULATORY_CARE_PROVIDER_SITE_OTHER): Payer: BLUE CROSS/BLUE SHIELD | Admitting: Internal Medicine

## 2015-03-31 ENCOUNTER — Encounter: Payer: Self-pay | Admitting: Internal Medicine

## 2015-03-31 VITALS — BP 130/90 | HR 69 | Temp 98.3°F | Resp 14 | Ht 61.25 in | Wt 150.0 lb

## 2015-03-31 DIAGNOSIS — R030 Elevated blood-pressure reading, without diagnosis of hypertension: Secondary | ICD-10-CM | POA: Diagnosis not present

## 2015-03-31 DIAGNOSIS — Z0001 Encounter for general adult medical examination with abnormal findings: Secondary | ICD-10-CM | POA: Diagnosis not present

## 2015-03-31 DIAGNOSIS — R9431 Abnormal electrocardiogram [ECG] [EKG]: Secondary | ICD-10-CM | POA: Diagnosis not present

## 2015-03-31 DIAGNOSIS — Z0000 Encounter for general adult medical examination without abnormal findings: Secondary | ICD-10-CM

## 2015-03-31 MED ORDER — HYDROCHLOROTHIAZIDE 12.5 MG PO CAPS: 12.5000 mg | ORAL_CAPSULE | Freq: Every day | ORAL | Status: DC

## 2015-03-31 NOTE — Patient Instructions (Addendum)
Minimal Blood Pressure Goal= AVERAGE < 140/90;  Ideal is an AVERAGE < 135/85. This AVERAGE should be calculated from @ least 5-7 BP readings taken @ different times of day on different days of week. You should not respond to isolated BP readings , but rather the AVERAGE for that week .Please bring your  blood pressure cuff to office visits to verify that it is reliable.It  can also be checked against the blood pressure device at the pharmacy. Finger or wrist cuffs are not dependable; an arm cuff is. If BP up please start low dose HCTZ 12.5 mg.  Please sign a release of records to Dr Collene Mares for records related to recent lab results.

## 2015-03-31 NOTE — Progress Notes (Signed)
Pre visit review using our clinic review tool, if applicable. No additional management support is needed unless otherwise documented below in the visit note. 

## 2015-03-31 NOTE — Assessment & Plan Note (Signed)
/  16 compared to 07/09/10 there is been diffuse precordial T changes. Specifically the T-wave is now inverted in V1-3. It is low in V4 and V5.

## 2015-03-31 NOTE — Progress Notes (Signed)
Subjective:    Patient ID: Stephanie Gray, female    DOB: 02-22-1966, 49 y.o.   MRN: [id number]  HPI  She is here for a physical;acute issues are delineated below.  She has had some variability her blood pressure recently. When she was at Dr. Arvil Persons office her blood pressure was 165/110. She is not monitoring her BP on a regular basis.  She doesn't cook with salt but does not restrict sodium in her diet per se. She does eat red meat daily. She does not eat excess amounts of fried foods.  She's physically active riding twice a day and performing house and yard work .She has no associated cardiopulmonary symptoms  She's been compliant with thyroid medication without adverse effect. She does have some constipation but denies other possible endocrinologic symptoms  She does have occasional dysphagia with dry foods. She had been concerned that she was at risk for ulcer. Until 6 months ago she was drinking up to 20 diet drinks per day. Now she only drinks these occasionally. She does drink  increased amounts of unsweetened tea. She does have a pending upper endoscopy as well as colonoscopy on 04/07/15 by Dr. Collene Mares.  Review of Systems  No significant sleep disorder or weight change.Appetite good. No blurred, double ,loss of vision No diarrhea or hoarseness. No change in nails,skin or skin No numbness or tingling; tremor No temperature intolerance to heat ,cold Anxiety; depression controlled  Chest pain, palpitations, tachycardia, exertional dyspnea, paroxysmal nocturnal dyspnea, claudication or edema are absent.       Objective:   Physical Exam  Gen.: Adequately nourished in appearance. Alert, appropriate and cooperative throughout exam. BMI: 28.1 Appears younger than stated age  Head: Normocephalic without obvious abnormalities  Eyes: No corneal or conjunctival inflammation noted. Pupils equal round reactive to light and accommodation. Extraocular motion intact.  Ears:  External  ear exam reveals no significant lesions or deformities. Canals clear .TMs normal. Hearing is grossly normal bilaterally. Nose: External nasal exam reveals no deformity or inflammation. Nasal mucosa are pink and moist. No lesions or exudates noted.   Mouth: Oral mucosa and oropharynx reveal no lesions or exudates. Teeth in good repair. Neck: No deformities, masses, or tenderness noted. Range of motion &. Thyroid normal Lungs: Normal respiratory effort; chest expands symmetrically. Lungs are clear to auscultation without rales, wheezes, or increased work of breathing. Heart: Normal rate and rhythm. Normal S1 and S2. No gallop, click, or rub. no murmur. Abdomen: Bowel sounds normal; abdomen soft and nontender. No masses, organomegaly or hernias noted. Genitalia: as per Gyn                                  Musculoskeletal/extremities: No deformity or scoliosis noted of  the thoracic or lumbar spine.  No clubbing, cyanosis, edema, or significant extremity  deformity noted.  Range of motion normal . Tone & strength normal. Hand joints normal.  Fingernail  health good. Crepitus of knees  Able to lie down & sit up w/o help.  Negative SLR bilaterally Vascular: Carotid, radial artery, dorsalis pedis and  posterior tibial pulses are equal.Decreased DPP. No bruits present. Neurologic: Alert and oriented x3. Deep tendon reflexes symmetrical and normal.  Gait normal        Skin: Intact without suspicious lesions or rashes. Lymph: No cervical, axillary lymphadenopathy present. Psych: Mood and affect are normal. Normally interactive  Assessment & Plan:  #1 comprehensive physical exam; no acute findings.  #2 elevated BP w/o diagnosis of HTN  #3 abnormal EKG (done because of BP concerns) reveals  diffuse precordial T changes compared to 07/09/10. Specifically she now has inverted T waves in V1-3 and low T  waves in V4 and V5.Evaluation may require nuclear stress test.  Plan: see Orders  & Recommendations. Labs performed by Dr. Collene Mares will be requested for review. Cardiology referral will be pursued to evaluate the diffuse T changes which are asymptomatic. These could be related to hypertension. Ischemic process must be ruled out.

## 2015-04-01 ENCOUNTER — Encounter: Payer: Self-pay | Admitting: Internal Medicine

## 2015-04-01 ENCOUNTER — Telehealth: Payer: Self-pay

## 2015-04-01 NOTE — Telephone Encounter (Signed)
-----   Message from Hendricks Limes, MD sent at 04/01/2015  5:16 AM EDT ----- Please FAX office visit results to Dr Verdia Kuba, GI

## 2015-04-01 NOTE — Telephone Encounter (Signed)
03/31/15 office note has been faxed to Dr Verdia Kuba

## 2015-04-27 ENCOUNTER — Encounter: Payer: Self-pay | Admitting: Internal Medicine

## 2015-04-27 DIAGNOSIS — D126 Benign neoplasm of colon, unspecified: Secondary | ICD-10-CM

## 2015-04-27 HISTORY — DX: Benign neoplasm of colon, unspecified: D12.6

## 2015-05-07 ENCOUNTER — Encounter: Payer: Self-pay | Admitting: Internal Medicine

## 2015-05-14 ENCOUNTER — Telehealth: Payer: Self-pay | Admitting: Internal Medicine

## 2015-05-14 NOTE — Telephone Encounter (Signed)
New Message  We are sending this correspondence to inform your office that we have attempted to contact the patient on three separate attempts to schedule per referral. We were unsuccessful in our attempts to reschedule and wanted you to be aware of our efforts.  Thank you!  [facility]  Patient care coordinator

## 2015-05-20 ENCOUNTER — Other Ambulatory Visit: Payer: Self-pay

## 2015-05-20 ENCOUNTER — Encounter: Payer: Self-pay | Admitting: Internal Medicine

## 2015-05-20 MED ORDER — SYNTHROID 50 MCG PO TABS: 50.0000 ug | ORAL_TABLET | Freq: Every morning | ORAL | Status: DC

## 2015-06-12 ENCOUNTER — Other Ambulatory Visit (INDEPENDENT_AMBULATORY_CARE_PROVIDER_SITE_OTHER): Payer: BLUE CROSS/BLUE SHIELD

## 2015-06-12 ENCOUNTER — Encounter: Payer: Self-pay | Admitting: Internal Medicine

## 2015-06-12 ENCOUNTER — Ambulatory Visit (INDEPENDENT_AMBULATORY_CARE_PROVIDER_SITE_OTHER): Payer: BLUE CROSS/BLUE SHIELD | Admitting: Internal Medicine

## 2015-06-12 VITALS — BP 138/92 | HR 67 | Temp 98.2°F | Resp 16 | Wt 153.0 lb

## 2015-06-12 DIAGNOSIS — T679XXA Effect of heat and light, unspecified, initial encounter: Secondary | ICD-10-CM | POA: Diagnosis not present

## 2015-06-12 DIAGNOSIS — R946 Abnormal results of thyroid function studies: Secondary | ICD-10-CM | POA: Diagnosis not present

## 2015-06-12 DIAGNOSIS — R030 Elevated blood-pressure reading, without diagnosis of hypertension: Secondary | ICD-10-CM | POA: Insufficient documentation

## 2015-06-12 LAB — BASIC METABOLIC PANEL
BUN: 14 mg/dL (ref 6–23)
CHLORIDE: 105 meq/L (ref 96–112)
CO2: 27 mEq/L (ref 19–32)
Calcium: 9 mg/dL (ref 8.4–10.5)
Creatinine, Ser: 0.75 mg/dL (ref 0.40–1.20)
GFR: 86.76 mL/min (ref >60.00)
Glucose, Bld: 99 mg/dL (ref 70–99)
Potassium: 3.8 mEq/L (ref 3.5–5.1)
SODIUM: 138 meq/L (ref 135–145)

## 2015-06-12 LAB — TSH: TSH: 3.35 u[IU]/mL (ref 0.35–4.50)

## 2015-06-12 MED ORDER — LOSARTAN POTASSIUM 50 MG PO TABS
50.0000 mg | ORAL_TABLET | Freq: Every day | ORAL | Status: DC
Start: 2015-06-12 — End: 2016-06-24

## 2015-06-12 NOTE — Progress Notes (Signed)
Pre visit review using our clinic review tool, if applicable. No additional management support is needed unless otherwise documented below in the visit note. 

## 2015-06-12 NOTE — Progress Notes (Signed)
   Subjective:    Patient ID: Stephanie Gray, female    DOB: Jan 13, 1966, 49 y.o.   MRN: [id number]  HPI Her blood pressure was found to be 145/90 @ her gynecology office visit. She is not monitoring blood pressure at home. She had been on low dose HCTZ but this was discontinued due to concerns about becoming dehydrated. She works for up to 14 hours in temperatures up  to 100 with profound diaphoresis. She had been rehydrating with up to 20-40 diet drinks a day. She was concerned the aspartame may be a component of her hypertension. She is on a modified heart healthy, low-salt diet.  She had been on Synthroid 50 g from her gynecologist but stopped this 3 weeks ago. It was apparently prescribed for breast discharge following delivery. When she discontinued that she stopped have hot flashes. The last TSH on record here was 1.81 in October 2014.  At this time she has no cardiovascular or endocrinologic symptoms. Her colonoscopy is up-to-date.   Review of Systems  Chest pain, palpitations, tachycardia, exertional dyspnea, paroxysmal nocturnal dyspnea, claudication or edema are absent. No unexplained weight loss, abdominal pain, significant dyspepsia, dysphagia, melena, rectal bleeding, or persistently small caliber stools. Change in hair, skin, nails denied. No bowel changes of constipation or diarrhea. No intolerance to heat or cold.     Objective:   Physical Exam  Pertinent or positive findings include: Grade 1/2 systolic murmur present at the base  General appearance :adequately nourished; in no distress.  Eyes: No conjunctival inflammation or scleral icterus is present. Fundal exam is unremarkable  Oral exam:  Lips and gums are healthy appearing.There is no oropharyngeal erythema or exudate noted. Dental hygiene is good.  Heart:  Normal rate and regular rhythm. S1 and S2 normal without gallop,  click, rub or other extra sounds    Lungs:Chest clear to auscultation; no wheezes,  rhonchi,rales ,or rubs present.No increased work of breathing.   Abdomen: bowel sounds normal, soft and non-tender without masses, organomegaly or hernias noted.  No guarding or rebound.   Vascular : all pulses equal ; no bruits present.  Skin:Warm & dry.  Intact without suspicious lesions or rashes ; no tenting or jaundice   Lymphatic: No lymphadenopathy is noted about the head, neck, axilla.   Neuro: Strength, tone & DTRs normal.         Assessment & Plan:  #1 hypertension  #2 exposure to extreme heat for a prolonged periods of time with risk for dehydration    #3 questionable thyroid status  Plan: See orders and recommendations

## 2015-06-12 NOTE — Patient Instructions (Addendum)
Minimal Blood Pressure Goal= AVERAGE < 140/90;  Ideal is an AVERAGE < 135/85. This AVERAGE should be calculated from @ least 5-7 BP readings taken @ different times of day on different days of week. You should not respond to isolated BP readings , but rather the AVERAGE for that week .Please bring your  blood pressure cuff to office visits to verify that it is reliable.It  can also be checked against the blood pressure device at the pharmacy. Finger or wrist cuffs are not dependable; an arm cuff is. Fill the  prescription for the BP medication if BP NOT @ goal based on  7 to 14 day average. Your next office appointment will be determined based upon review of your pending labs . Those written interpretation of the lab results and instructions will be transmitted to you by mail for your records. Critical results will be called. Followup as needed for any active or acute issue. Please report any significant change in your symptoms. Drink Gatorade Lite instead of water if having excessive sweating. This will prevent loss of essential  Electrolytes.

## 2016-04-06 ENCOUNTER — Ambulatory Visit (INDEPENDENT_AMBULATORY_CARE_PROVIDER_SITE_OTHER): Payer: 59 | Admitting: Family

## 2016-04-06 ENCOUNTER — Encounter: Payer: Self-pay | Admitting: Family

## 2016-04-06 VITALS — BP 140/84 | HR 81 | Temp 98.3°F | Resp 16 | Ht 61.75 in | Wt 144.8 lb

## 2016-04-06 DIAGNOSIS — Z0000 Encounter for general adult medical examination without abnormal findings: Secondary | ICD-10-CM

## 2016-04-06 DIAGNOSIS — Z23 Encounter for immunization: Secondary | ICD-10-CM | POA: Diagnosis not present

## 2016-04-06 HISTORY — DX: Encounter for general adult medical examination without abnormal findings: Z00.00

## 2016-04-06 NOTE — Assessment & Plan Note (Signed)
1) Anticipatory Guidance: Discussed importance of wearing a seatbelt while driving and not texting while driving; changing batteries in smoke detector at least once annually; wearing suntan lotion when outside; eating a balanced and moderate diet; getting physical activity at least 30 minutes per day.  2) Immunizations / Screenings / Labs:  Declines tetanus. All immunizations are up-to-date per recommendations. Due for a a vision exam encouraged to be completed independently. Scheduled for a well woman exam for completion of Pap and mammogram. Obtain hepatitis C antibody for hepatitis C screening. Obtain CBC, CMET, Lipid profile and TSH.   Overall well exam with risk factors for cardiovascular disease including hypertension which is adequately maintained through medication. Continue other healthy lifestyle behaviors and choices. Recommend completion of health maintenance items as discussed above. Continue nutritional intake that is moderate, balance, and varied as well as physical activity levels. Follow-up prevention exam in 1 year. Follow-up office visit pending blood work as necessary.

## 2016-04-06 NOTE — Progress Notes (Signed)
Subjective:    Patient ID: Stephanie Gray, female    DOB: Oct 29, 1966, 50 y.o.   MRN: [id number]  Chief Complaint  Patient presents with  . Establish Care    CPE, hepatitis A vaccine    HPI:  Stephanie Gray is a 50 y.o. female who presents today for an annual wellness visit.   1) Health Maintenance -   Diet - Averages about 3 meals per day consisting of fruits, vegetables, chicken, beef, pork; 4-5 cups of caffeine daily  Exercise - Daily;   2) Preventative Exams / Immunizations:  Dental -- Up to date  Vision -- Due for exam    Health Maintenance  Topic Date Due  . Hepatitis C Screening  29-May-1966  . HIV Screening  12/12/1979  . TETANUS/TDAP  04/20/2014  . INFLUENZA VACCINE  07/20/2016  . MAMMOGRAM  02/17/2017  . PAP SMEAR  02/17/2018  . COLONOSCOPY  04/23/2020    Immunization History  Administered Date(s) Administered  . Hep A / Hep B 04/06/2016  . Influenza Split 10/30/2012  . Influenza-Unspecified 11/19/2015  . Td 04/20/2004    Allergies  Allergen Reactions  . Penicillins     Rash Because of a history of documented adverse serious drug reaction;Medi Alert bracelet  is recommended     Outpatient Prescriptions Prior to Visit  Medication Sig Dispense Refill  . DULoxetine (CYMBALTA) 60 MG capsule Take 60 mg by mouth daily.    Marland Kitchen ibuprofen (ADVIL,MOTRIN) 200 MG tablet Take 400 mg by mouth every 6 (six) hours as needed.      Marland Kitchen losartan (COZAAR) 50 MG tablet Take 1 tablet (50 mg total) by mouth daily. 30 tablet 3  . hydrochlorothiazide (MICROZIDE) 12.5 MG capsule Take 1 capsule (12.5 mg total) by mouth daily. (Patient not taking: Reported on 06/12/2015) 30 capsule 5  . SYNTHROID 50 MCG tablet Take 1 tablet (50 mcg total) by mouth every morning. (Patient not taking: Reported on 06/12/2015) 30 tablet 0   No facility-administered medications prior to visit.     Past Medical History  Diagnosis Date  . Thyroid disease   . Hypertension   .  Cervical disc disease      Past Surgical History  Procedure Laterality Date  . Cesarean section      X 2, Dr Reino Kent  . Tubal ligation      bladder laceration repair  . Appendectomy    . Bladder repair       Family History  Problem Relation Age of Onset  . Hypertension Mother   . Diabetes Father   . Heart disease Father   . Hypertension Father   . Stroke Maternal Grandfather 72    dementia  . Cancer Paternal Grandfather     testicular  . Other Sister     neurologic issue  . Mitral valve prolapse Maternal Grandmother   . Heart disease Paternal Grandmother   . Breast cancer Paternal Grandmother      Social History   Social History  . Marital Status: Married    Spouse Name: N/A  . Number of Children: 2  . Years of Education: 18   Occupational History  . [employer]    Social History Main Topics  . Smoking status: Never Smoker   . Smokeless tobacco: Never Used  . Alcohol Use: Yes     Comment: Rarely  . Drug Use: No  . Sexual Activity: Not on file   Other Topics Concern  . Not on  file   Social History Narrative   Fun: Ride horses.   Denies abuse and feels safe at home.       Review of Systems  Constitutional: Denies fever, chills, fatigue, or significant weight gain/loss. HENT: Head: Denies headache or neck pain Ears: Denies changes in hearing, ringing in ears, earache, drainage Nose: Denies discharge, stuffiness, itching, nosebleed, sinus pain Throat: Denies sore throat, hoarseness, dry mouth, sores, thrush Eyes: Denies loss/changes in vision, pain, redness, blurry/double vision, flashing lights Cardiovascular: Denies chest pain/discomfort, tightness, palpitations, shortness of breath with activity, difficulty lying down, swelling, sudden awakening with shortness of breath Respiratory: Denies shortness of breath, cough, sputum production, wheezing Gastrointestinal: Denies dysphasia, heartburn, change in appetite, nausea, change in bowel habits, rectal  bleeding, constipation, diarrhea, yellow skin or eyes Genitourinary: Denies frequency, urgency, burning/pain, blood in urine, incontinence, change in urinary strength. Musculoskeletal: Denies muscle/joint pain, stiffness, back pain, redness or swelling of joints, trauma Skin: Denies rashes, lumps, itching, dryness, color changes, or hair/nail changes Neurological: Denies dizziness, fainting, seizures, weakness, numbness, tingling, tremor Psychiatric - Denies nervousness, stress, depression or memory loss Endocrine: Denies heat or cold intolerance, sweating, frequent urination, excessive thirst, changes in appetite Hematologic: Denies ease of bruising or bleeding     Objective:     BP 140/84 mmHg  Pulse 81  Temp(Src) 98.3 F (36.8 C) (Oral)  Resp 16  Ht 5' 1.75" (1.568 m)  Wt 144 lb 12.8 oz (65.681 kg)  BMI 26.71 kg/m2  SpO2 97% Nursing note and vital signs reviewed.  Physical Exam  Constitutional: She is oriented to person, place, and time. She appears well-developed and well-nourished. No distress.  HENT:  Head: Normocephalic.  Right Ear: Hearing, tympanic membrane, external ear and ear canal normal.  Left Ear: Hearing, tympanic membrane, external ear and ear canal normal.  Nose: Nose normal.  Mouth/Throat: Uvula is midline, oropharynx is clear and moist and mucous membranes are normal.  Eyes: Conjunctivae and EOM are normal. Pupils are equal, round, and reactive to light.  Neck: Neck supple. No JVD present. No tracheal deviation present. No thyromegaly present.  Cardiovascular: Normal rate, regular rhythm, normal heart sounds and intact distal pulses.   Pulmonary/Chest: Effort normal and breath sounds normal.  Abdominal: Soft. Bowel sounds are normal. She exhibits no distension and no mass. There is no tenderness. There is no rebound and no guarding.  Musculoskeletal: Normal range of motion. She exhibits no edema or tenderness.  Lymphadenopathy:    She has no cervical  adenopathy.  Neurological: She is alert and oriented to person, place, and time. She has normal reflexes. No cranial nerve deficit. She exhibits normal muscle tone. Coordination normal.  Skin: Skin is warm and dry.  Psychiatric: She has a normal mood and affect. Her behavior is normal. Judgment and thought content normal.       Assessment & Plan:   Problem List Items Addressed This Visit      Other   Routine general medical examination at a health care facility - Primary    1) Anticipatory Guidance: Discussed importance of wearing a seatbelt while driving and not texting while driving; changing batteries in smoke detector at least once annually; wearing suntan lotion when outside; eating a balanced and moderate diet; getting physical activity at least 30 minutes per day.  2) Immunizations / Screenings / Labs:  Declines tetanus. All immunizations are up-to-date per recommendations. Due for a a vision exam encouraged to be completed independently. Scheduled for a well woman exam for  completion of Pap and mammogram. Obtain hepatitis C antibody for hepatitis C screening. Obtain CBC, CMET, Lipid profile and TSH.   Overall well exam with risk factors for cardiovascular disease including hypertension which is adequately maintained through medication. Continue other healthy lifestyle behaviors and choices. Recommend completion of health maintenance items as discussed above. Continue nutritional intake that is moderate, balance, and varied as well as physical activity levels. Follow-up prevention exam in 1 year. Follow-up office visit pending blood work as necessary.       Relevant Orders   Lipid panel   Hepatitis C antibody   CBC   Comprehensive metabolic panel   TSH    Other Visit Diagnoses    Need for hepatitis A and B vaccination        Relevant Orders    Hepatitis A hepatitis B combined vaccine IM (Completed)

## 2016-04-06 NOTE — Progress Notes (Signed)
Pre visit review using our clinic review tool, if applicable. No additional management support is needed unless otherwise documented below in the visit note. 

## 2016-04-06 NOTE — Patient Instructions (Addendum)
Thank you for choosing  HealthCare.  Summary/Instructions:   Please stop by the lab on the basement level of the building for your blood work. Your results will be released to MyChart (or called to you) after review, usually within 72 hours after test completion. If any changes need to be made, you will be notified at that same time.  Health Maintenance, Female Adopting a healthy lifestyle and getting preventive care can go a long way to promote health and wellness. Talk with your health care provider about what schedule of regular examinations is right for you. This is a good chance for you to check in with your provider about disease prevention and staying healthy. In between checkups, there are plenty of things you can do on your own. Experts have done a lot of research about which lifestyle changes and preventive measures are most likely to keep you healthy. Ask your health care provider for more information. WEIGHT AND DIET  Eat a healthy diet  Be sure to include plenty of vegetables, fruits, low-fat dairy products, and lean protein.  Do not eat a lot of foods high in solid fats, added sugars, or salt.  Get regular exercise. This is one of the most important things you can do for your health.  Most adults should exercise for at least 150 minutes each week. The exercise should increase your heart rate and make you sweat (moderate-intensity exercise).  Most adults should also do strengthening exercises at least twice a week. This is in addition to the moderate-intensity exercise.  Maintain a healthy weight  Body mass index (BMI) is a measurement that can be used to identify possible weight problems. It estimates body fat based on height and weight. Your health care provider can help determine your BMI and help you achieve or maintain a healthy weight.  For females 20 years of age and older:   A BMI below 18.5 is considered underweight.  A BMI of 18.5 to 24.9 is normal.  A  BMI of 25 to 29.9 is considered overweight.  A BMI of 30 and above is considered obese.  Watch levels of cholesterol and blood lipids  You should start having your blood tested for lipids and cholesterol at 52 years of age, then have this test every 5 years.  You may need to have your cholesterol levels checked more often if:  Your lipid or cholesterol levels are high.  You are older than 52 years of age.  You are at high risk for heart disease.  CANCER SCREENING   Lung Cancer  Lung cancer screening is recommended for adults 55-80 years old who are at high risk for lung cancer because of a history of smoking.  A yearly low-dose CT scan of the lungs is recommended for people who:  Currently smoke.  Have quit within the past 15 years.  Have at least a 30-pack-year history of smoking. A pack year is smoking an average of one pack of cigarettes a day for 1 year.  Yearly screening should continue until it has been 15 years since you quit.  Yearly screening should stop if you develop a health problem that would prevent you from having lung cancer treatment.  Breast Cancer  Practice breast self-awareness. This means understanding how your breasts normally appear and feel.  It also means doing regular breast self-exams. Let your health care provider know about any changes, no matter how small.  If you are in your 20s or 30s, you should have a   breast exam (CBE) by a health care provider every 1-3 years as part of a regular health exam.  If you are 47 or older, have a CBE every year. Also consider having a breast X-ray (mammogram) every year.  If you have a family history of breast cancer, talk to your health care provider about genetic screening.  If you are at high risk for breast cancer, talk to your health care provider about having an MRI and a mammogram every year.  Breast cancer gene (BRCA) assessment is recommended for women who have family members with  BRCA-related cancers. BRCA-related cancers include:  Breast.  Ovarian.  Tubal.  Peritoneal cancers.  Results of the assessment will determine the need for genetic counseling and BRCA1 and BRCA2 testing. Cervical Cancer Your health care provider may recommend that you be screened regularly for cancer of the pelvic organs (ovaries, uterus, and vagina). This screening involves a pelvic examination, including checking for microscopic changes to the surface of your cervix (Pap test). You may be encouraged to have this screening done every 3 years, beginning at age 26.  For women ages 95-65, health care providers may recommend pelvic exams and Pap testing every 3 years, or they may recommend the Pap and pelvic exam, combined with testing for human papilloma virus (HPV), every 5 years. Some types of HPV increase your risk of cervical cancer. Testing for HPV may also be done on women of any age with unclear Pap test results.  Other health care providers may not recommend any screening for nonpregnant women who are considered low risk for pelvic cancer and who do not have symptoms. Ask your health care provider if a screening pelvic exam is right for you.  If you have had past treatment for cervical cancer or a condition that could lead to cancer, you need Pap tests and screening for cancer for at least 20 years after your treatment. If Pap tests have been discontinued, your risk factors (such as having a new sexual partner) need to be reassessed to determine if screening should resume. Some women have medical problems that increase the chance of getting cervical cancer. In these cases, your health care provider may recommend more frequent screening and Pap tests. Colorectal Cancer  This type of cancer can be detected and often prevented.  Routine colorectal cancer screening usually begins at 52 years of age and continues through 52 years of age.  Your health care provider may recommend screening at  an earlier age if you have risk factors for colon cancer.  Your health care provider may also recommend using home test kits to check for hidden blood in the stool.  A small camera at the end of a tube can be used to examine your colon directly (sigmoidoscopy or colonoscopy). This is done to check for the earliest forms of colorectal cancer.  Routine screening usually begins at age 60.  Direct examination of the colon should be repeated every 5-10 years through 52 years of age. However, you may need to be screened more often if early forms of precancerous polyps or small growths are found. Skin Cancer  Check your skin from head to toe regularly.  Tell your health care provider about any new moles or changes in moles, especially if there is a change in a mole's shape or color.  Also tell your health care provider if you have a mole that is larger than the size of a pencil eraser.  Always use sunscreen. Apply sunscreen liberally and  repeatedly throughout the day.  Protect yourself by wearing long sleeves, pants, a wide-brimmed hat, and sunglasses whenever you are outside. HEART DISEASE, DIABETES, AND HIGH BLOOD PRESSURE   High blood pressure causes heart disease and increases the risk of stroke. High blood pressure is more likely to develop in:  People who have blood pressure in the high end of the normal range (130-139/85-89 mm Hg).  People who are overweight or obese.  People who are African American.  If you are 6-75 years of age, have your blood pressure checked every 3-5 years. If you are 78 years of age or older, have your blood pressure checked every year. You should have your blood pressure measured twice--once when you are at a hospital or clinic, and once when you are not at a hospital or clinic. Record the average of the two measurements. To check your blood pressure when you are not at a hospital or clinic, you can use:  An automated blood pressure machine at a  pharmacy.  A home blood pressure monitor.  If you are between 33 years and 77 years old, ask your health care provider if you should take aspirin to prevent strokes.  Have regular diabetes screenings. This involves taking a blood sample to check your fasting blood sugar level.  If you are at a normal weight and have a low risk for diabetes, have this test once every three years after 52 years of age.  If you are overweight and have a high risk for diabetes, consider being tested at a younger age or more often. PREVENTING INFECTION  Hepatitis B  If you have a higher risk for hepatitis B, you should be screened for this virus. You are considered at high risk for hepatitis B if:  You were born in a country where hepatitis B is common. Ask your health care provider which countries are considered high risk.  Your parents were born in a high-risk country, and you have not been immunized against hepatitis B (hepatitis B vaccine).  You have HIV or AIDS.  You use needles to inject street drugs.  You live with someone who has hepatitis B.  You have had sex with someone who has hepatitis B.  You get hemodialysis treatment.  You take certain medicines for conditions, including cancer, organ transplantation, and autoimmune conditions. Hepatitis C  Blood testing is recommended for:  Everyone born from [birth year] through 1967.  Anyone with known risk factors for hepatitis C. Sexually transmitted infections (STIs)  You should be screened for sexually transmitted infections (STIs) including gonorrhea and chlamydia if:  You are sexually active and are younger than 52 years of age.  You are older than 52 years of age and your health care provider tells you that you are at risk for this type of infection.  Your sexual activity has changed since you were last screened and you are at an increased risk for chlamydia or gonorrhea. Ask your health care provider if you are at risk.  If you do not  have HIV, but are at risk, it may be recommended that you take a prescription medicine daily to prevent HIV infection. This is called pre-exposure prophylaxis (PrEP). You are considered at risk if:  You are sexually active and do not regularly use condoms or know the HIV status of your partner(s).  You take drugs by injection.  You are sexually active with a partner who has HIV. Talk with your health care provider about whether you are at  at high risk of being infected with HIV. If you choose to begin PrEP, you should first be tested for HIV. You should then be tested every 3 months for as long as you are taking PrEP.  PREGNANCY   If you are premenopausal and you may become pregnant, ask your health care provider about preconception counseling.  If you may become pregnant, take 400 to 800 micrograms (mcg) of folic acid every day.  If you want to prevent pregnancy, talk to your health care provider about birth control (contraception). OSTEOPOROSIS AND MENOPAUSE   Osteoporosis is a disease in which the bones lose minerals and strength with aging. This can result in serious bone fractures. Your risk for osteoporosis can be identified using a bone density scan.  If you are 65 years of age or older, or if you are at risk for osteoporosis and fractures, ask your health care provider if you should be screened.  Ask your health care provider whether you should take a calcium or vitamin D supplement to lower your risk for osteoporosis.  Menopause may have certain physical symptoms and risks.  Hormone replacement therapy may reduce some of these symptoms and risks. Talk to your health care provider about whether hormone replacement therapy is right for you.  HOME CARE INSTRUCTIONS   Schedule regular health, dental, and eye exams.  Stay current with your immunizations.   Do not use any tobacco products including cigarettes, chewing tobacco, or electronic cigarettes.  If you are pregnant, do not  drink alcohol.  If you are breastfeeding, limit how much and how often you drink alcohol.  Limit alcohol intake to no more than 1 drink per day for nonpregnant women. One drink equals 12 ounces of beer, 5 ounces of wine, or 1 ounces of hard liquor.  Do not use street drugs.  Do not share needles.  Ask your health care provider for help if you need support or information about quitting drugs.  Tell your health care provider if you often feel depressed.  Tell your health care provider if you have ever been abused or do not feel safe at home.   This information is not intended to replace advice given to you by your health care provider. Make sure you discuss any questions you have with your health care provider.   Document Released: 06/21/2011 Document Revised: 12/27/2014 Document Reviewed: 11/07/2013 Else[employer] Interactive Patient Education 2016 [employer]    

## 2016-06-24 ENCOUNTER — Other Ambulatory Visit: Payer: Self-pay | Admitting: *Deleted

## 2016-06-24 DIAGNOSIS — R030 Elevated blood-pressure reading, without diagnosis of hypertension: Secondary | ICD-10-CM

## 2016-06-24 MED ORDER — LOSARTAN POTASSIUM 50 MG PO TABS: 50.0000 mg | ORAL_TABLET | Freq: Every day | ORAL | Status: DC

## 2016-06-28 ENCOUNTER — Observation Stay (HOSPITAL_COMMUNITY)
Admission: EM | Admit: 2016-06-28 | Discharge: 2016-06-29 | Disposition: A | Discharge status: Home / Self Care | Payer: 59 | Attending: Internal Medicine | Admitting: Internal Medicine

## 2016-06-28 ENCOUNTER — Inpatient Hospital Stay (HOSPITAL_COMMUNITY): Payer: 59

## 2016-06-28 ENCOUNTER — Encounter (HOSPITAL_COMMUNITY): Payer: Self-pay | Admitting: Family Medicine

## 2016-06-28 DIAGNOSIS — E079 Disorder of thyroid, unspecified: Secondary | ICD-10-CM | POA: Diagnosis not present

## 2016-06-28 DIAGNOSIS — Z880 Allergy status to penicillin: Secondary | ICD-10-CM | POA: Diagnosis not present

## 2016-06-28 DIAGNOSIS — N61 Mastitis without abscess: Principal | ICD-10-CM | POA: Insufficient documentation

## 2016-06-28 DIAGNOSIS — I10 Essential (primary) hypertension: Secondary | ICD-10-CM | POA: Insufficient documentation

## 2016-06-28 DIAGNOSIS — N611 Abscess of the breast and nipple: Secondary | ICD-10-CM | POA: Diagnosis not present

## 2016-06-28 LAB — COMPREHENSIVE METABOLIC PANEL
ALBUMIN: 4 g/dL (ref 3.5–5.0)
ALT: 16 U/L (ref 14–54)
AST: 22 U/L (ref 15–41)
Alkaline Phosphatase: 80 U/L (ref 38–126)
Anion gap: 9 (ref 5–15)
BILIRUBIN TOTAL: 0.7 mg/dL (ref 0.3–1.2)
BUN: 12 mg/dL (ref 6–20)
CHLORIDE: 101 mmol/L (ref 101–111)
CO2: 26 mmol/L (ref 22–32)
Calcium: 9.1 mg/dL (ref 8.9–10.3)
Creatinine, Ser: 0.8 mg/dL (ref 0.44–1.00)
GFR calc Af Amer: >60 mL/min (ref >60)
GFR calc non Af Amer: >60 mL/min (ref >60)
GLUCOSE: 103 mg/dL — AB (ref 65–99)
POTASSIUM: 4.2 mmol/L (ref 3.5–5.1)
SODIUM: 136 mmol/L (ref 135–145)
Total Protein: 6.9 g/dL (ref 6.5–8.1)

## 2016-06-28 LAB — I-STAT CG4 LACTIC ACID, ED
Lactic Acid, Venous: 1.83 mmol/L (ref 0.5–1.9)
Lactic Acid, Venous: 1.19 mmol/L (ref 0.5–1.9)

## 2016-06-28 LAB — CBC WITH DIFFERENTIAL/PLATELET
Basophils Absolute: 0 10*3/uL (ref 0.0–0.1)
Basophils Relative: 0 %
Eosinophils Absolute: 0.1 10*3/uL (ref 0.0–0.7)
Eosinophils Relative: 1 %
HEMATOCRIT: 38.6 % (ref 36.0–46.0)
HEMOGLOBIN: 12.7 g/dL (ref 12.0–15.0)
LYMPHS ABS: 1 10*3/uL (ref 0.7–4.0)
LYMPHS PCT: 10 %
MCH: 28.3 pg (ref 26.0–34.0)
MCHC: 32.9 g/dL (ref 30.0–36.0)
MCV: 86 fL (ref 78.0–100.0)
MONO ABS: 0.1 10*3/uL (ref 0.1–1.0)
MONOS PCT: 2 %
NEUTROS ABS: 8.1 10*3/uL — AB (ref 1.7–7.7)
NEUTROS PCT: 87 %
Platelets: 206 10*3/uL (ref 150–400)
RBC: 4.49 MIL/uL (ref 3.87–5.11)
RDW: 13 % (ref 11.5–15.5)
WBC: 9.3 10*3/uL (ref 4.0–10.5)

## 2016-06-28 MED ORDER — ACETAMINOPHEN 500 MG PO TABS
1000.0000 mg | ORAL_TABLET | Freq: Once | ORAL | Status: AC
  Administered 2016-06-28: 1000 mg via ORAL
  Filled 2016-06-28: qty 2

## 2016-06-28 MED ORDER — CLINDAMYCIN PHOSPHATE 600 MG/50ML IV SOLN
600.0000 mg | Freq: Once | INTRAVENOUS | Status: AC
  Administered 2016-06-28: 600 mg via INTRAVENOUS
  Filled 2016-06-28: qty 50

## 2016-06-28 MED ORDER — CLINDAMYCIN PHOSPHATE 600 MG/50ML IV SOLN
600.0000 mg | Freq: Three times a day (TID) | INTRAVENOUS | Status: DC
  Administered 2016-06-29: 600 mg via INTRAVENOUS
  Filled 2016-06-28 (×4): qty 50

## 2016-06-28 MED ORDER — SODIUM CHLORIDE 0.9 % IV SOLN
INTRAVENOUS | Status: AC
  Administered 2016-06-28: 22:00:00 via INTRAVENOUS

## 2016-06-28 MED ORDER — SODIUM CHLORIDE 0.9 % IV BOLUS (SEPSIS)
1000.0000 mL | Freq: Once | INTRAVENOUS | Status: AC
  Administered 2016-06-28: 1000 mL via INTRAVENOUS

## 2016-06-28 MED ORDER — ONDANSETRON HCL 4 MG PO TABS: 4.0000 mg | ORAL_TABLET | Freq: Four times a day (QID) | ORAL | Status: DC | PRN

## 2016-06-28 MED ORDER — SODIUM CHLORIDE 0.9 % IV BOLUS (SEPSIS)
500.0000 mL | Freq: Once | INTRAVENOUS | Status: AC
  Administered 2016-06-28: 500 mL via INTRAVENOUS

## 2016-06-28 MED ORDER — ACETAMINOPHEN 650 MG RE SUPP: 650.0000 mg | Freq: Four times a day (QID) | RECTAL | Status: DC | PRN

## 2016-06-28 MED ORDER — HYDROCODONE-ACETAMINOPHEN 5-325 MG PO TABS: 1.0000 | ORAL_TABLET | ORAL | Status: DC | PRN

## 2016-06-28 MED ORDER — ACETAMINOPHEN 325 MG PO TABS
650.0000 mg | ORAL_TABLET | Freq: Four times a day (QID) | ORAL | Status: DC | PRN
  Administered 2016-06-29: 650 mg via ORAL
  Filled 2016-06-28: qty 2

## 2016-06-28 MED ORDER — ONDANSETRON HCL 4 MG/2ML IJ SOLN: 4.0000 mg | Freq: Four times a day (QID) | INTRAMUSCULAR | Status: DC | PRN

## 2016-06-28 NOTE — ED Notes (Signed)
RN assisted pt in ambulating to the bathroom, slightly lightheaded.

## 2016-06-28 NOTE — ED Provider Notes (Signed)
CSN: [id number]     Arrival date & time 06/28/16  1638 History   First MD Initiated Contact with Patient 06/28/16 1713     Chief Complaint  Patient presents with  . Breast Problem  . Shaking  . Chills      The history is provided by the patient.  Patient presents with breast infection. Began last night. Had some chills last night and again today. Saw Dr. Butler Denmark in the office today and prescribed Bactrim. About an hour after taking a driving home began to havechills and shaking chest pain and difficulty breathing with it. Has had mastitis in the past. States she is feeling little bit better. Still has a little tachycardia. She is otherwise healthy. States she is very sensitive to medicines. States there is a swelling of the breasts. States she was able to express a little bit of pus earlier. States the redness is increased since the visit to the doctor but the mass size is unchanged.  Past Medical History  Diagnosis Date  . Thyroid disease   . Hypertension   . Cervical disc disease    Past Surgical History  Procedure Laterality Date  . Cesarean section      X 2, Dr Reino Kent  . Tubal ligation      bladder laceration repair  . Appendectomy    . Bladder repair     Family History  Problem Relation Age of Onset  . Hypertension Mother   . Diabetes Father   . Heart disease Father   . Hypertension Father   . Stroke Maternal Grandfather 72    dementia  . Cancer Paternal Grandfather     testicular  . Other Sister     neurologic issue  . Mitral valve prolapse Maternal Grandmother   . Heart disease Paternal Grandmother   . Breast cancer Paternal Grandmother    Social History  Substance Use Topics  . Smoking status: Never Smoker   . Smokeless tobacco: Never Used  . Alcohol Use: Yes     Comment: Rarely   OB History    No data available     Review of Systems  Constitutional: Positive for chills and appetite change. Negative for activity change.  Eyes: Negative for pain.   Respiratory: Positive for shortness of breath. Negative for chest tightness.   Cardiovascular: Negative for chest pain and leg swelling.  Gastrointestinal: Negative for nausea, vomiting, abdominal pain and diarrhea.  Genitourinary: Negative for flank pain.       Right breast pain swelling and redness.  Musculoskeletal: Negative for back pain and neck stiffness.  Skin: Positive for color change. Negative for rash.  Neurological: Negative for weakness, numbness and headaches.  Psychiatric/Behavioral: Negative for behavioral problems.      Allergies  Benzodiazepines; Other; and Penicillins  Home Medications   Prior to Admission medications   Medication Sig Start Date End Date Taking? Authorizing Provider  DULoxetine (CYMBALTA) 60 MG capsule Take 60 mg by mouth daily.   Yes Historical Provider, MD  ibuprofen (ADVIL,MOTRIN) 200 MG tablet Take 400 mg by mouth every 6 (six) hours as needed for fever or moderate pain.    Yes Historical Provider, MD  losartan (COZAAR) 50 MG tablet Take 1 tablet (50 mg total) by mouth daily. 06/24/16  Yes Golden Circle, FNP   BP 123/75 mmHg  Pulse 114  Temp(Src) 101.4 F (38.6 C) (Oral)  Resp 18  Wt 145 lb (65.772 kg)  SpO2 100% Physical Exam  Constitutional: She appears  well-developed.  HENT:  Head: Atraumatic.  Neck: Neck supple.  Cardiovascular:  Tachycardia  Pulmonary/Chest: Effort normal.  Right breast with kiwi sized firmness deep. There is erythema on the breast that has been demarcated by a pen. Approximately 5 x 5 cm. No nipple drainage.  Abdominal: Soft. There is no tenderness.  Neurological: She is alert.  Skin: Skin is warm.    ED Course  Procedures (including critical care time) Labs Review Labs Reviewed  COMPREHENSIVE METABOLIC PANEL - Abnormal; Notable for the following:    Glucose, Bld 103 (*)    All other components within normal limits  CBC WITH DIFFERENTIAL/PLATELET - Abnormal; Notable for the following:    Neutro Abs  8.1 (*)    All other components within normal limits  I-STAT CG4 LACTIC ACID, ED    Imaging Review No results found. I have personally reviewed and evaluated these images and lab results as part of my medical decision-making.   EKG Interpretation None      MDM   Final diagnoses:  Mastitis    Patient with mastitis on the right breast. Began last night. Now with fevers and chills. Had Bactrim orally earlier today and then developed worsening seizures and chills and shaking. She does not know if she has had that medicine in the past and states she is sensitive some medicines. Developed fever here and some continue tachycardia although patient lactic acid was is normal. Will admit to internal medicine    Davonna Belling, MD 06/28/16 1911

## 2016-06-28 NOTE — H&P (Addendum)
Stephanie Gray [id number] DOB: [birth year] DOA: 06/28/2016     PCP: Mauricio Po, FNP   Outpatient Specialists: [facility] Patient coming from:  home Lives  With family    Chief Complaint: right breaswt swelling  HPI: Stephanie Gray is a 50 y.o. female with medical history significant of HTN    Presented with 2 day hx of breast swelling and last night she noted a painfully swelling in her right breast. With severe breast pain, chills, nausea, poor appetite,  She had much milder event 3 years ago was treated with PO antibiotics.  She has chronic right breastdischarge for 17 years  With different ducts making discharge of different color from light yellow to dark green. Worse prior to period. She started to have manapusal symptoms lately and discharge has stopped. Recently started on Losartan. 2 Days ago breast started to swell again she was able to express small amount of pus from the nipple. Today she started to feel hardening of the breast and developed significant fevers   Denies any injury to the breast IN ER: fever, HR 118, SBP 122/79 WBC 9.3 Hg 12.7 Cr 0.8 Which her son showed 2.1 cm breast abscess    Hospitalist was called for admission for mastitis with breast abscess  Review of Systems:    Pertinent positives include: Fevers, chills, fatigue, nausea, breast pain and swelling  Constitutional:  No weight loss, night sweats, weight loss  HEENT:  No headaches, Difficulty swallowing,Tooth/dental problems,Sore throat,  No sneezing, itching, ear ache, nasal congestion, post nasal drip,  Cardio-vascular:  No chest pain, Orthopnea, PND, anasarca, dizziness, palpitations.no Bilateral lower extremity swelling  GI:  No heartburn, indigestion, abdominal pain, vomiting, diarrhea, change in bowel habits, loss of appetite, melena, blood in stool, hematemesis Resp:  no shortness of breath at rest. No dyspnea on exertion, No excess mucus, no  productive cough, No non-productive cough, No coughing up of blood.No change in color of mucus.No wheezing. Skin:  no rash or lesions. No jaundice GU:  no dysuria, change in color of urine, no urgency or frequency. No straining to urinate.  No flank pain.  Musculoskeletal:  No joint pain or no joint swelling. No decreased range of motion. No back pain.  Psych:  No change in mood or affect. No depression or anxiety. No memory loss.  Neuro: no localizing neurological complaints, no tingling, no weakness, no double vision, no gait abnormality, no slurred speech, no confusion  As per HPI otherwise 10 point review of systems negative.   Past Medical History: Past Medical History  Diagnosis Date  . Thyroid disease   . Hypertension   . Cervical disc disease    Past Surgical History  Procedure Laterality Date  . Cesarean section      X 2, Dr Reino Kent  . Tubal ligation      bladder laceration repair  . Appendectomy    . Bladder repair       Social History:  Ambulatory  independently      reports that she has never smoked. She has never used smokeless tobacco. She reports that she drinks alcohol. She reports that she does not use illicit drugs.  Allergies:   Allergies  Allergen Reactions  . Benzodiazepines Other (See Comments)    Patient needed crash cart after tubal ligation  . Other Other (See Comments)    Patient is extremely sensitive to all medicines, maybe needs 1/4 or 1/8 dosage of normal adult  . Penicillins Rash  Rash because of a history of documented adverse serious drug reaction;Medi Alert bracelet  is recommended       Family History:    Family History  Problem Relation Age of Onset  . Hypertension Mother   . Diabetes Father   . Heart disease Father   . Hypertension Father   . Stroke Maternal Grandfather 72    dementia  . Cancer Paternal Grandfather     testicular  . Other Sister     neurologic issue  . Mitral valve prolapse Maternal Grandmother     . Heart disease Paternal Grandmother   . Breast cancer Paternal Grandmother     Medications: Prior to Admission medications   Medication Sig Start Date End Date Taking? Authorizing Provider  DULoxetine (CYMBALTA) 60 MG capsule Take 60 mg by mouth daily.   Yes Historical Provider, MD  ibuprofen (ADVIL,MOTRIN) 200 MG tablet Take 400 mg by mouth every 6 (six) hours as needed for fever or moderate pain.    Yes Historical Provider, MD  losartan (COZAAR) 50 MG tablet Take 1 tablet (50 mg total) by mouth daily. 06/24/16  Yes Golden Circle, FNP    Physical Exam: Patient Vitals for the past 24 hrs:  BP Temp Temp src Pulse Resp SpO2 Weight  06/28/16 1930 122/79 mmHg - - 118 - 100 % -  06/28/16 1900 126/80 mmHg - - 113 - 98 % -  06/28/16 1846 123/75 mmHg 101.4 F (38.6 C) Oral 114 18 100 % -  06/28/16 1732 - - - - - - 65.772 kg (145 lb)  06/28/16 1715 141/88 mmHg - - 110 16 98 % -  06/28/16 1649 122/94 mmHg 98.8 F (37.1 C) Oral 103 18 100 % -    1. General:  in No Acute distress 2. Psychological: Alert and   Oriented 3. Head/ENT:   Moist  Mucous Membranes                          Head Non traumatic, neck supple                          Normal Dentition 4. SKIN:  decreased Skin turgor,  Skin clean Dry and intact redness and swelling of right breast noted 5. Heart: Regular rate and rhythm no  Murmur, Rub or gallop 6. Lungs:  Clear to auscultation bilaterally, no wheezes or crackles   7. Abdomen: Soft, non-tender, Non distended 8. Lower extremities: no clubbing, cyanosis, or edema 9. Neurologically Grossly intact, moving all 4 extremities equally 10. MSK: Normal range of motion   body mass index is 26.75 kg/(m^2).  Labs on Admission:   Labs on Admission: I have personally reviewed following labs and imaging studies  CBC:  Recent Labs Lab 06/28/16 1735  WBC 9.3  NEUTROABS 8.1*  HGB 12.7  HCT 38.6  MCV 86.0  PLT 99991111   Basic Metabolic Panel:  Recent Labs Lab  06/28/16 1735  NA 136  K 4.2  CL 101  CO2 26  GLUCOSE 103*  BUN 12  CREATININE 0.80  CALCIUM 9.1   GFR: Estimated Creatinine Clearance: 73.5 mL/min (by C-G formula based on Cr of 0.8). Liver Function Tests:  Recent Labs Lab 06/28/16 1735  AST 22  ALT 16  ALKPHOS 80  BILITOT 0.7  PROT 6.9  ALBUMIN 4.0   No results for input(s): LIPASE, AMYLASE in the last 168 hours. No results for  input(s): AMMONIA in the last 168 hours. Coagulation Profile: No results for input(s): INR, PROTIME in the last 168 hours. Cardiac Enzymes: No results for input(s): CKTOTAL, CKMB, CKMBINDEX, TROPONINI in the last 168 hours. BNP (last 3 results) No results for input(s): PROBNP in the last 8760 hours. HbA1C: No results for input(s): HGBA1C in the last 72 hours. CBG: No results for input(s): GLUCAP in the last 168 hours. Lipid Profile: No results for input(s): CHOL, HDL, LDLCALC, TRIG, CHOLHDL, LDLDIRECT in the last 72 hours. Thyroid Function Tests: No results for input(s): TSH, T4TOTAL, FREET4, T3FREE, THYROIDAB in the last 72 hours. Anemia Panel: No results for input(s): VITAMINB12, FOLATE, FERRITIN, TIBC, IRON, RETICCTPCT in the last 72 hours. Urine analysis: No results found for: COLORURINE, APPEARANCEUR, LABSPEC, PHURINE, GLUCOSEU, HGBUR, BILIRUBINUR, KETONESUR, PROTEINUR, UROBILINOGEN, NITRITE, LEUKOCYTESUR Sepsis Labs: @LABRCNTIP (procalcitonin:4,lacticidven:4) )No results found for this or any previous visit (from the past 240 hour(s)).     UA not obtained   No results found for: HGBA1C  Estimated Creatinine Clearance: 73.5 mL/min (by C-G formula based on Cr of 0.8).  BNP (last 3 results) No results for input(s): PROBNP in the last 8760 hours.   ECG REPORT Not obtained  Filed Weights   06/28/16 1732  Weight: 65.772 kg (145 lb)     Cultures: No results found for: SDES, SPECREQUEST, CULT, REPTSTATUS   Radiological Exams on Admission: No results found.  Chart has been  reviewed    Assessment/Plan 50 y.o. female with medical history significant of HTN here with right breast mastitis and abscess  Present on Admission:  . Mastitis, acute/ . Breast abscess of female - Gen. surgery has been consulted make nothing by mouth post midnight for possible drainage tomorrow continue IV antibiotics clindamycin. Given  chronic galactorrhea will need to have his further worked up as an outpatient may benefit from [place] of the brain . Hypertension - watch blood pressure currently hold home medications and  Rehydrate given poor by mouth intake   Other plan as per orders.  DVT prophylaxis:  SCD     Code Status:  FULL CODE  as per patient   Family Communication:   Family not  at  Bedside   Disposition Plan:       To home once workup is complete and patient is stable   Consults called: general surgery [place]  Admission status: inpatient      Level of care     medical floor      I have spent a total of 65 min on this admission   extra time was spent to discuss case With General surgery Dr Brantley Stage  , 06/28/2016, 10:20 PM   [facility]  Pager [phone]   after 2 AM please page floor coverage PA If 7AM-7PM, please contact the day team taking care of the patient  [email or web address]  Password [email or web address]

## 2016-06-28 NOTE — ED Notes (Signed)
Pt here for mastitis, chills, shaking. sts her breast is very hard, red and painful. sts she went to the doctor and was prescribed bactrim and took one pill. sts she is extremely worse.

## 2016-06-28 NOTE — ED Notes (Signed)
Gave pt an ice pack for her breast, per Jennifer-RN.

## 2016-06-28 NOTE — ED Notes (Signed)
Admitting MD at the bedside.  

## 2016-06-28 NOTE — ED Notes (Signed)
Patient transported to Ultrasound 

## 2016-06-29 DIAGNOSIS — N611 Abscess of the breast and nipple: Secondary | ICD-10-CM

## 2016-06-29 DIAGNOSIS — N61 Mastitis without abscess: Secondary | ICD-10-CM | POA: Diagnosis not present

## 2016-06-29 LAB — COMPREHENSIVE METABOLIC PANEL
ALT: 19 U/L (ref 14–54)
AST: 16 U/L (ref 15–41)
Albumin: 3.1 g/dL — ABNORMAL LOW (ref 3.5–5.0)
Alkaline Phosphatase: 65 U/L (ref 38–126)
Anion gap: 5 (ref 5–15)
BILIRUBIN TOTAL: 0.8 mg/dL (ref 0.3–1.2)
BUN: 10 mg/dL (ref 6–20)
CO2: 26 mmol/L (ref 22–32)
CREATININE: 0.72 mg/dL (ref 0.44–1.00)
Calcium: 8.1 mg/dL — ABNORMAL LOW (ref 8.9–10.3)
Chloride: 109 mmol/L (ref 101–111)
GFR calc Af Amer: >60 mL/min (ref >60)
Glucose, Bld: 104 mg/dL — ABNORMAL HIGH (ref 65–99)
Potassium: 4 mmol/L (ref 3.5–5.1)
Sodium: 140 mmol/L (ref 135–145)
TOTAL PROTEIN: 5.5 g/dL — AB (ref 6.5–8.1)

## 2016-06-29 LAB — SURGICAL PCR SCREEN
MRSA, PCR: NEGATIVE
Staphylococcus aureus: NEGATIVE

## 2016-06-29 LAB — CBC
HEMATOCRIT: 32.1 % — AB (ref 36.0–46.0)
Hemoglobin: 10.6 g/dL — ABNORMAL LOW (ref 12.0–15.0)
MCH: 28.3 pg (ref 26.0–34.0)
MCHC: 33 g/dL (ref 30.0–36.0)
MCV: 85.6 fL (ref 78.0–100.0)
PLATELETS: 176 10*3/uL (ref 150–400)
RBC: 3.75 MIL/uL — AB (ref 3.87–5.11)
RDW: 13.2 % (ref 11.5–15.5)
WBC: 11 10*3/uL — AB (ref 4.0–10.5)

## 2016-06-29 LAB — TYPE AND SCREEN
ABO/RH(D): B POS
ANTIBODY SCREEN: NEGATIVE

## 2016-06-29 LAB — MAGNESIUM: Magnesium: 2.1 mg/dL (ref 1.7–2.4)

## 2016-06-29 LAB — PHOSPHORUS: PHOSPHORUS: 3.3 mg/dL (ref 2.5–4.6)

## 2016-06-29 LAB — ABO/RH: ABO/RH(D): B POS

## 2016-06-29 LAB — TSH: TSH: 2.237 u[IU]/mL (ref 0.350–4.500)

## 2016-06-29 MED ORDER — CLINDAMYCIN HCL 150 MG PO CAPS: 450.0000 mg | ORAL_CAPSULE | Freq: Three times a day (TID) | ORAL | Status: DC

## 2016-06-29 MED ORDER — BACID PO TABS
2.0000 | ORAL_TABLET | Freq: Three times a day (TID) | ORAL | Status: DC
  Filled 2016-06-29 (×2): qty 2

## 2016-06-29 MED ORDER — SACCHAROMYCES BOULARDII 250 MG PO CAPS: 250.0000 mg | ORAL_CAPSULE | Freq: Two times a day (BID) | ORAL | Status: DC

## 2016-06-29 NOTE — Discharge Summary (Signed)
BRANDEY Gray, is a 50 y.o. female  DOB [birth year]  MRN [id number].  Admission date:  06/28/2016  Admitting Physician  Toy Baker, MD  Discharge Date:  06/29/2016   Primary MD  Mauricio Po, FNP  Recommendations for primary care physician for things to follow:  - Patient to follow with [facility] in 10 days regarding repeat ultrasound to ensure resolution of right breast abscess, she will need screening mammogram when infection is totally resolves.    Admission Diagnosis  Mastitis [N61.0]   Discharge Diagnosis  Mastitis [N61.0]   Active Problems:   Mastitis   Hypertension   Mastitis, acute   Breast abscess of female      Past Medical History  Diagnosis Date  . Thyroid disease   . Hypertension   . Cervical disc disease     Past Surgical History  Procedure Laterality Date  . Cesarean section      X 2, Dr Reino Kent  . Tubal ligation      bladder laceration repair  . Appendectomy    . Bladder repair         History of present illness and  Hospital Course:     Kindly see H&P for history of present illness and admission details, please review complete Labs, Consult reports and Test reports for all details in brief  HPI  from the history and physical done on the day of admission 06/28/2016  HPI: Stephanie Gray is a 50 y.o. female with medical history significant of HTN   Presented with 2 day hx of breast swelling and last night she noted a painfully swelling in her right breast. With severe breast pain, chills, nausea, poor appetite,  She had much milder event 3 years ago was treated with PO antibiotics.  She has chronic right breastdischarge for 17 years With different ducts making discharge of different color from light yellow to dark green. Worse prior to period. She started to have manapusal symptoms lately and discharge has stopped.  Recently started on Losartan. 2 Days ago breast started to swell again she was able to express small amount of pus from the nipple. Today she started to feel hardening of the breast and developed significant fevers  Denies any injury to the breast IN ER: fever, HR 118, SBP 122/79 WBC 9.3 Hg 12.7 Cr 0.8 Which her ultrasound  showed 2.1 cm breast abscess  Hospital Course   Right breast mastitis with small abscess - Patient afebrile, no leukocytosis, was started on clindamycin, seen by general surgery who recommended ultrasound-guided aspiration, seen by IR, discussed with Dr. Francena Hanly, reports abscesses too small for aspiration a, and recommendation is to treat with by mouth antibiotics, and then to follow as an outpatient with [facility] for repeat ultrasound to ensure resolution of of abscess, we'll discharge on total 10 days of by mouth clindamycin, will add probiotics for her. - Discussed with [facility], referral has been faxed, and they will call patient with an appointment.  Hypertension -  Continue with home medication   Discharge Condition:  Stable D/W  husband at bedside   Follow UP  Follow-up Information    Schedule an appointment as soon as possible for a visit with Mauricio Po, [place].   Specialty:  Family Medicine   Contact information:   [place]  [id number] [phone]       Follow up with [facility].   Specialty:  Diagnostic Radiology   Why:  Will be called with an appointment in 7-10 days, if not please call for an appointment   Contact information:   [address] [place] [address] [phone]         Discharge Instructions  and  Discharge Medications     Discharge Instructions    Diet - low sodium heart healthy    Complete by:  As directed      Discharge instructions    Complete by:  As directed   Follow with Primary MD Mauricio Po, FNP in 7 days  - Please  follow with [facility], in 7-10 days, you will be called with an appointment, if not please call [phone]  Get CBC, CMP, checked  by Primary MD next visit.    Activity: As tolerated with Full fall precautions use walker/cane & assistance as needed   Disposition Home    Diet: Heart Healthy , with feeding assistance and aspiration precautions.  For Heart failure patients - Check your Weight same time everyday, if you gain over 2 pounds, or you develop in leg swelling, experience more shortness of breath or chest pain, call your Primary MD immediately. Follow Cardiac Low Salt Diet and 1.5 lit/day fluid restriction.   On your next visit with your primary care physician please Get Medicines reviewed and adjusted.   Please request your Prim.MD to go over all Hospital Tests and Procedure/Radiological results at the follow up, please get all Hospital records sent to your Prim MD by signing hospital release before you go home.   If you experience worsening of your admission symptoms, develop shortness of breath, life threatening emergency, suicidal or homicidal thoughts you must seek medical attention immediately by calling 911 or calling your MD immediately  if symptoms less severe.  You Must read complete instructions/literature along with all the possible adverse reactions/side effects for all the Medicines you take and that have been prescribed to you. Take any new Medicines after you have completely understood and accpet all the possible adverse reactions/side effects.   Do not drive, operating heavy machinery, perform activities at heights, swimming or participation in water activities or provide baby sitting services if your were admitted for syncope or siezures until you have seen by Primary MD or a Neurologist and advised to do so again.  Do not drive when taking Pain medications.    Do not take more than prescribed Pain, Sleep and Anxiety  Medications  Special Instructions: If you have smoked or chewed Tobacco  in the last 2 yrs please stop smoking, stop any regular Alcohol  and or any Recreational drug use.  Wear Seat belts while driving.   Please note  You were cared for by a hospitalist during your hospital stay. If you have any questions about your discharge medications or the care you received while you were in the hospital after you are discharged, you can call the unit and asked to speak with the hospitalist on call if the hospitalist that  took care of you is not available. Once you are discharged, your primary care physician will handle any further medical issues. Please note that NO REFILLS for any discharge medications will be authorized once you are discharged, as it is imperative that you return to your primary care physician (or establish a relationship with a primary care physician if you do not have one) for your aftercare needs so that they can reassess your need for medications and monitor your lab values.     Increase activity slowly    Complete by:  As directed             Medication List    TAKE these medications        clindamycin 150 MG capsule  Commonly known as:  CLEOCIN  Take 3 capsules (450 mg total) by mouth 3 (three) times daily.     DULoxetine 60 MG capsule  Commonly known as:  CYMBALTA  Take 60 mg by mouth daily.     ibuprofen 200 MG tablet  Commonly known as:  ADVIL,MOTRIN  Take 400 mg by mouth every 6 (six) hours as needed for fever or moderate pain.     losartan 50 MG tablet  Commonly known as:  COZAAR  Take 1 tablet (50 mg total) by mouth daily.     saccharomyces boulardii 250 MG capsule  Commonly known as:  FLORASTOR  Take 1 capsule (250 mg total) by mouth 2 (two) times daily.          Diet and Activity recommendation: See Discharge Instructions above   Consults obtained -  Sx IR   Major procedures and Radiology Reports - PLEASE review detailed and final reports  for all details, in brief -     US Breast Ltd Uni Right Inc Axilla  06/28/2016  CLINICAL DATA:  Right breast pain and fever for 1 day. Mass. History of prior breast abscess. EXAM: ULTRASOUND OF THE RIGHT BREAST COMPARISON:  None. FINDINGS: A fluid collection measuring 2.5 x 1.1 x 1.7 cm is identified at 12 o'clock position of the right breast. The collection contains internal echoes. Soft tissues the breasts are edematous. Sonographer reports skin redness. IMPRESSION: Findings compatible with mastitis of the right breast with an abscess measuring 2.5 cm in diameter identified. Mammogram is recommended after the patient's acute episode has passed. Electronically Signed   By: Inge Rise M.D.   On: 06/28/2016 21:15    Micro Results    Recent Results (from the past 240 hour(s))  Surgical PCR screen     Status: None   Collection Time: 06/29/16  8:37 AM  Result Value Ref Range Status   MRSA, PCR NEGATIVE NEGATIVE Final   Staphylococcus aureus NEGATIVE NEGATIVE Final    Comment:        The Xpert SA Assay (FDA approved for NASAL specimens in patients over 67 years of age), is one component of a comprehensive surveillance program.  Test performance has been validated by [facility] for patients greater than or equal to 98 year old. It is not intended to diagnose infection nor to guide or monitor treatment.        Today   Subjective:   Timara Osso today has no headache,no chest abdominal pain,no new weakness tingling or numbness.  Objective:   Blood pressure 117/61, pulse 76, temperature 98.6 F (37 C), temperature source Oral, resp. rate 18, height 5\' 4"  (1.626 m), weight 68.947 kg (152 lb), SpO2 100 %.   Intake/Output Summary (Last  24 hours) at 06/29/16 1428 Last data filed at 06/29/16 1355  Gross per 24 hour  Intake 3415.83 ml  Output    400 ml  Net 3015.83 ml    Exam Awake Alert, Oriented x 3,  [place].AT,PERRAL Supple Neck,No JVD, No cervical  lymphadenopathy appriciated.  Symmetrical Chest wall movement, Good air movement bilaterally, CTAB RRR,No Gallops,Rubs or new Murmurs, No Parasternal Heave +ve B.Sounds, Abd Soft, Non tender, No organomegaly appriciated, No rebound -guarding or rigidity. No Cyanosis, Clubbing or edema, No new Rash or bruise - Right breast erythema, but no nipples or skin changes  Physical exam was done with female chaperone, [employer] nursing student [place] Taylor  Data Review   CBC w Diff: Lab Results  Component Value Date   WBC 11.0* 06/29/2016   HGB 10.6* 06/29/2016   HCT 32.1* 06/29/2016   PLT 176 06/29/2016   LYMPHOPCT 10 06/28/2016   MONOPCT 2 06/28/2016   EOSPCT 1 06/28/2016   BASOPCT 0 06/28/2016    CMP: Lab Results  Component Value Date   NA 140 06/29/2016   K 4.0 06/29/2016   CL 109 06/29/2016   CO2 26 06/29/2016   BUN 10 06/29/2016   CREATININE 0.72 06/29/2016   PROT 5.5* 06/29/2016   ALBUMIN 3.1* 06/29/2016   BILITOT 0.8 06/29/2016   ALKPHOS 65 06/29/2016   AST 16 06/29/2016   ALT 19 06/29/2016  .   Total Time in preparing paper work, data evaluation and todays exam - 35 minutes  ,  M.D on 06/29/2016 at 2:28 PM  [facility]   Office  [phone]

## 2016-06-29 NOTE — Progress Notes (Signed)
Patient ID: Stephanie Gray, female   DOB: May 01, 1966, 50 y.o.   MRN: [id number] Dr. Pascal Lux discussed this case with Dr. Waldron Labs.  He tried to contact Dr. Brantley Stage, but was unable to reach him via his pager.  Dr. Pascal Lux recommends antibiotic therapy and to hold off on aspiration as this area is very small and not really amendable to aspiration at this time.  If she fails to improve, then she could get a repeat US and follow up with the breast center for further care.  , E

## 2016-06-29 NOTE — Consult Note (Signed)
Reason for Consult: Right breast mastitis Referring Physician: Darnell Level MD  Stephanie Gray is an 50 y.o. female.  HPI: Asked to see the patient at the request of Dr.DUTOVA for right breast redness, swelling and pain for the last 48 hours. Patient has history of left breast abscess back in 2013. She presented the emergency room with red right breast swelling, and fever. Denies any discharge. Ultrasound revealed a 2 cm fluid collection in the breast.  Past Medical History  Diagnosis Date  . Thyroid disease   . Hypertension   . Cervical disc disease     Past Surgical History  Procedure Laterality Date  . Cesarean section      X 2, Dr Reino Kent  . Tubal ligation      bladder laceration repair  . Appendectomy    . Bladder repair      Family History  Problem Relation Age of Onset  . Hypertension Mother   . Diabetes Father   . Heart disease Father   . Hypertension Father   . Stroke Maternal Grandfather 72    dementia  . Cancer Paternal Grandfather     testicular  . Other Sister     neurologic issue  . Mitral valve prolapse Maternal Grandmother   . Heart disease Paternal Grandmother   . Breast cancer Paternal Grandmother     Social History:  reports that she has never smoked. She has never used smokeless tobacco. She reports that she drinks alcohol. She reports that she does not use illicit drugs.  Allergies:  Allergies  Allergen Reactions  . Benzodiazepines Other (See Comments)    Patient needed crash cart after tubal ligation  . Other Other (See Comments)    Patient is extremely sensitive to all medicines, maybe needs 1/4 or 1/8 dosage of normal adult  . Penicillins Rash    Rash because of a history of documented adverse serious drug reaction;Medi Alert bracelet  is recommended    Medications: I have reviewed the patient's current medications.  Results for orders placed or performed during the hospital encounter of 06/28/16 (from the past 48 hour(s))  Comprehensive  metabolic panel     Status: Abnormal   Collection Time: 06/28/16  5:35 PM  Result Value Ref Range   Sodium 136 135 - 145 mmol/L   Potassium 4.2 3.5 - 5.1 mmol/L   Chloride 101 101 - 111 mmol/L   CO2 26 22 - 32 mmol/L   Glucose, Bld 103 (H) 65 - 99 mg/dL   BUN 12 6 - 20 mg/dL   Creatinine, Ser 0.80 0.44 - 1.00 mg/dL   Calcium 9.1 8.9 - 10.3 mg/dL   Total Protein 6.9 6.5 - 8.1 g/dL   Albumin 4.0 3.5 - 5.0 g/dL   AST 22 15 - 41 U/L   ALT 16 14 - 54 U/L   Alkaline Phosphatase 80 38 - 126 U/L   Total Bilirubin 0.7 0.3 - 1.2 mg/dL   GFR calc non Af Amer >60 >60 mL/min   GFR calc Af Amer >60 >60 mL/min    Comment: (NOTE) The eGFR has been calculated using the CKD EPI equation. This calculation has not been validated in all clinical situations. eGFR's persistently <60 mL/min signify possible Chronic Kidney Disease.    Anion gap 9 5 - 15  CBC with Differential     Status: Abnormal   Collection Time: 06/28/16  5:35 PM  Result Value Ref Range   WBC 9.3 4.0 - 10.5 K/uL  RBC 4.49 3.87 - 5.11 MIL/uL   Hemoglobin 12.7 12.0 - 15.0 g/dL   HCT 38.6 36.0 - 46.0 %   MCV 86.0 78.0 - 100.0 fL   MCH 28.3 26.0 - 34.0 pg   MCHC 32.9 30.0 - 36.0 g/dL   RDW 13.0 11.5 - 15.5 %   Platelets 206 150 - 400 K/uL   Neutrophils Relative % 87 %   Neutro Abs 8.1 (H) 1.7 - 7.7 K/uL   Lymphocytes Relative 10 %   Lymphs Abs 1.0 0.7 - 4.0 K/uL   Monocytes Relative 2 %   Monocytes Absolute 0.1 0.1 - 1.0 K/uL   Eosinophils Relative 1 %   Eosinophils Absolute 0.1 0.0 - 0.7 K/uL   Basophils Relative 0 %   Basophils Absolute 0.0 0.0 - 0.1 K/uL  I-Stat CG4 Lactic Acid, ED     Status: None   Collection Time: 06/28/16  5:43 PM  Result Value Ref Range   Lactic Acid, Venous 1.83 0.5 - 1.9 mmol/L  I-Stat CG4 Lactic Acid, ED     Status: None   Collection Time: 06/28/16  8:05 PM  Result Value Ref Range   Lactic Acid, Venous 1.19 0.5 - 1.9 mmol/L    US Breast Ltd Uni Right Inc Axilla  06/28/2016  CLINICAL DATA:   Right breast pain and fever for 1 day. Mass. History of prior breast abscess. EXAM: ULTRASOUND OF THE RIGHT BREAST COMPARISON:  None. FINDINGS: A fluid collection measuring 2.5 x 1.1 x 1.7 cm is identified at 12 o'clock position of the right breast. The collection contains internal echoes. Soft tissues the breasts are edematous. Sonographer reports skin redness. IMPRESSION: Findings compatible with mastitis of the right breast with an abscess measuring 2.5 cm in diameter identified. Mammogram is recommended after the patient's acute episode has passed. Electronically Signed   By: Inge Rise M.D.   On: 06/28/2016 21:15    Review of Systems  Constitutional: Positive for fever and chills.  HENT: Negative.   Eyes: Negative.   Respiratory: Negative.   Gastrointestinal: Negative.   Musculoskeletal: Negative.   Neurological: Negative.    Blood pressure 109/60, pulse 94, temperature 99 F (37.2 C), temperature source Oral, resp. rate 19, height 5' 4"  (1.626 m), weight 68.947 kg (152 lb), SpO2 98 %. Physical Exam  Constitutional: She appears well-developed and well-nourished.  HENT:  Head: Normocephalic and atraumatic.  Eyes: Pupils are equal, round, and reactive to light. No scleral icterus.  Neck: Normal range of motion. Neck supple.  Respiratory: Right breast exhibits no inverted nipple and no mass. Left breast exhibits no inverted nipple, no mass, no nipple discharge and no skin change.    Skin: Skin is warm and dry.  Psychiatric: She has a normal mood and affect. Her behavior is normal.    Assessment/Plan: Right breast mastitis  Small abscess  Recommend ultrasound-guided aspiration. Agree with clindamycin. Can transition to by mouth antibiotics at any time. If aspiration fails, she would need incision and drainage of abscess. Discussed this with patient and she understands. Patient has history of left breast abscess in 2013. She will need a mammogram at some point as well once the  infection is cleared.  , A. 06/29/2016, 4:45 AM

## 2016-06-29 NOTE — Progress Notes (Signed)
Howell Rucks Skare to be D/C'd Home per MD order.  Discussed with the patient and all questions fully answered.  VSS. IV catheter discontinued intact. Site without signs and symptoms of complications. Dressing and pressure applied.  An After Visit Summary was printed and given to the patient. Patient received prescription.  D/c education completed with patient/family including follow up instructions, medication list, d/c activities limitations if indicated, with other d/c instructions as indicated by MD - patient able to verbalize understanding, all questions fully answered.   Patient instructed to return to ED, call 911, or call MD for any changes in condition.   Patient escorted via [place], and D/C home via private auto.  L'ESPERANCE,  C 06/29/2016 2:41 PM

## 2016-06-29 NOTE — Discharge Instructions (Signed)
Follow with Primary MD Mauricio Po, FNP in 7 days  - Please follow with [facility], in 7-10 days, you will be called with an appointment, if not please call [phone]  Get CBC, CMP, checked  by Primary MD next visit.    Activity: As tolerated with Full fall precautions use walker/cane & assistance as needed   Disposition Home    Diet: Heart Healthy , with feeding assistance and aspiration precautions.  For Heart failure patients - Check your Weight same time everyday, if you gain over 2 pounds, or you develop in leg swelling, experience more shortness of breath or chest pain, call your Primary MD immediately. Follow Cardiac Low Salt Diet and 1.5 lit/day fluid restriction.   On your next visit with your primary care physician please Get Medicines reviewed and adjusted.   Please request your Prim.MD to go over all Hospital Tests and Procedure/Radiological results at the follow up, please get all Hospital records sent to your Prim MD by signing hospital release before you go home.   If you experience worsening of your admission symptoms, develop shortness of breath, life threatening emergency, suicidal or homicidal thoughts you must seek medical attention immediately by calling 911 or calling your MD immediately  if symptoms less severe.  You Must read complete instructions/literature along with all the possible adverse reactions/side effects for all the Medicines you take and that have been prescribed to you. Take any new Medicines after you have completely understood and accpet all the possible adverse reactions/side effects.   Do not drive, operating heavy machinery, perform activities at heights, swimming or participation in water activities or provide baby sitting services if your were admitted for syncope or siezures until you have seen by Primary MD or a Neurologist and advised to do so again.  Do not drive when taking Pain medications.    Do not  take more than prescribed Pain, Sleep and Anxiety Medications  Special Instructions: If you have smoked or chewed Tobacco  in the last 2 yrs please stop smoking, stop any regular Alcohol  and or any Recreational drug use.  Wear Seat belts while driving.   Please note  You were cared for by a hospitalist during your hospital stay. If you have any questions about your discharge medications or the care you received while you were in the hospital after you are discharged, you can call the unit and asked to speak with the hospitalist on call if the hospitalist that took care of you is not available. Once you are discharged, your primary care physician will handle any further medical issues. Please note that NO REFILLS for any discharge medications will be authorized once you are discharged, as it is imperative that you return to your primary care physician (or establish a relationship with a primary care physician if you do not have one) for your aftercare needs so that they can reassess your need for medications and monitor your lab values.

## 2016-06-30 ENCOUNTER — Other Ambulatory Visit: Payer: Self-pay | Admitting: Obstetrics and Gynecology

## 2016-06-30 DIAGNOSIS — N61 Mastitis without abscess: Secondary | ICD-10-CM

## 2016-07-06 ENCOUNTER — Other Ambulatory Visit: Payer: Self-pay | Admitting: Obstetrics and Gynecology

## 2016-07-06 ENCOUNTER — Ambulatory Visit
Admission: RE | Admit: 2016-07-06 | Discharge: 2016-07-06 | Disposition: A | Discharge status: Home / Self Care | Payer: 59 | Source: Ambulatory Visit | Attending: Obstetrics and Gynecology | Admitting: Obstetrics and Gynecology

## 2016-07-06 DIAGNOSIS — N61 Mastitis without abscess: Secondary | ICD-10-CM

## 2016-07-06 DIAGNOSIS — N6310 Unspecified lump in the right breast, unspecified quadrant: Secondary | ICD-10-CM

## 2016-07-13 ENCOUNTER — Other Ambulatory Visit: Payer: Self-pay | Admitting: Obstetrics and Gynecology

## 2016-07-13 ENCOUNTER — Ambulatory Visit
Admission: RE | Admit: 2016-07-13 | Discharge: 2016-07-13 | Disposition: A | Discharge status: Home / Self Care | Payer: 59 | Source: Ambulatory Visit | Attending: Obstetrics and Gynecology | Admitting: Obstetrics and Gynecology

## 2016-07-13 DIAGNOSIS — N6310 Unspecified lump in the right breast, unspecified quadrant: Secondary | ICD-10-CM

## 2016-07-28 ENCOUNTER — Other Ambulatory Visit: Payer: Self-pay | Admitting: General Surgery

## 2016-07-28 DIAGNOSIS — R922 Inconclusive mammogram: Secondary | ICD-10-CM

## 2016-08-09 ENCOUNTER — Inpatient Hospital Stay: Admission: RE | Admit: 2016-08-09 | Payer: 59 | Source: Ambulatory Visit

## 2016-08-24 ENCOUNTER — Ambulatory Visit
Admission: RE | Admit: 2016-08-24 | Discharge: 2016-08-24 | Disposition: A | Discharge status: Home / Self Care | Payer: 59 | Source: Ambulatory Visit | Attending: General Surgery | Admitting: General Surgery

## 2016-08-24 DIAGNOSIS — R9230 Dense breasts, unspecified: Secondary | ICD-10-CM

## 2016-08-24 DIAGNOSIS — R922 Inconclusive mammogram: Secondary | ICD-10-CM

## 2016-08-24 MED ORDER — GADOBENATE DIMEGLUMINE 529 MG/ML IV SOLN
14.0000 mL | Freq: Once | INTRAVENOUS | Status: AC | PRN
  Administered 2016-08-24: 14 mL via INTRAVENOUS

## 2016-08-31 ENCOUNTER — Other Ambulatory Visit: Payer: Self-pay | Admitting: General Surgery

## 2016-08-31 DIAGNOSIS — N63 Unspecified lump in unspecified breast: Secondary | ICD-10-CM

## 2016-09-02 ENCOUNTER — Other Ambulatory Visit: Payer: 59

## 2016-09-06 ENCOUNTER — Ambulatory Visit
Admission: RE | Admit: 2016-09-06 | Discharge: 2016-09-06 | Disposition: A | Discharge status: Home / Self Care | Payer: 59 | Source: Ambulatory Visit | Attending: Obstetrics and Gynecology | Admitting: Obstetrics and Gynecology

## 2016-09-06 ENCOUNTER — Other Ambulatory Visit: Payer: Self-pay | Admitting: General Surgery

## 2016-09-06 ENCOUNTER — Ambulatory Visit
Admission: RE | Admit: 2016-09-06 | Discharge: 2016-09-06 | Disposition: A | Discharge status: Home / Self Care | Payer: 59 | Source: Ambulatory Visit | Attending: General Surgery | Admitting: General Surgery

## 2016-09-06 ENCOUNTER — Other Ambulatory Visit: Payer: Self-pay | Admitting: Obstetrics and Gynecology

## 2016-09-06 DIAGNOSIS — N63 Unspecified lump in unspecified breast: Secondary | ICD-10-CM

## 2016-09-06 DIAGNOSIS — R599 Enlarged lymph nodes, unspecified: Secondary | ICD-10-CM

## 2016-09-06 DIAGNOSIS — N6310 Unspecified lump in the right breast, unspecified quadrant: Secondary | ICD-10-CM

## 2016-09-06 MED ORDER — GADOBENATE DIMEGLUMINE 529 MG/ML IV SOLN
14.0000 mL | Freq: Once | INTRAVENOUS | Status: AC | PRN
  Administered 2016-09-06: 14 mL via INTRAVENOUS

## 2016-12-28 ENCOUNTER — Ambulatory Visit: Payer: Self-pay | Admitting: General Surgery

## 2016-12-28 DIAGNOSIS — N6310 Unspecified lump in the right breast, unspecified quadrant: Secondary | ICD-10-CM

## 2017-01-04 ENCOUNTER — Other Ambulatory Visit: Payer: Self-pay | Admitting: General Surgery

## 2017-01-04 DIAGNOSIS — N6310 Unspecified lump in the right breast, unspecified quadrant: Secondary | ICD-10-CM

## 2017-02-04 ENCOUNTER — Other Ambulatory Visit: Payer: Self-pay

## 2017-02-04 ENCOUNTER — Encounter (HOSPITAL_COMMUNITY)
Admission: RE | Admit: 2017-02-04 | Discharge: 2017-02-04 | Disposition: A | Discharge status: Home / Self Care | Payer: 59 | Source: Ambulatory Visit | Attending: General Surgery | Admitting: General Surgery

## 2017-02-04 ENCOUNTER — Encounter (HOSPITAL_COMMUNITY): Payer: Self-pay

## 2017-02-04 DIAGNOSIS — Z01818 Encounter for other preprocedural examination: Secondary | ICD-10-CM | POA: Insufficient documentation

## 2017-02-04 DIAGNOSIS — N6310 Unspecified lump in the right breast, unspecified quadrant: Secondary | ICD-10-CM | POA: Insufficient documentation

## 2017-02-04 DIAGNOSIS — Z01812 Encounter for preprocedural laboratory examination: Secondary | ICD-10-CM | POA: Insufficient documentation

## 2017-02-04 DIAGNOSIS — R001 Bradycardia, unspecified: Secondary | ICD-10-CM | POA: Insufficient documentation

## 2017-02-04 HISTORY — DX: Unspecified osteoarthritis, unspecified site: M19.90

## 2017-02-04 HISTORY — DX: Adverse effect of unspecified anesthetic, initial encounter: T41.45XA

## 2017-02-04 HISTORY — DX: Myoneural disorder, unspecified: G70.9

## 2017-02-04 HISTORY — DX: Other complications of anesthesia, initial encounter: T88.59XA

## 2017-02-04 HISTORY — DX: Anxiety disorder, unspecified: F41.9

## 2017-02-04 HISTORY — DX: Major depressive disorder, single episode, unspecified: F32.9

## 2017-02-04 HISTORY — DX: Depression, unspecified: F32.A

## 2017-02-04 LAB — BASIC METABOLIC PANEL
Anion gap: 6 (ref 5–15)
BUN: 15 mg/dL (ref 6–20)
CHLORIDE: 106 mmol/L (ref 101–111)
CO2: 29 mmol/L (ref 22–32)
CREATININE: 0.73 mg/dL (ref 0.44–1.00)
Calcium: 9.5 mg/dL (ref 8.9–10.3)
GFR calc Af Amer: >60 mL/min (ref >60)
GFR calc non Af Amer: >60 mL/min (ref >60)
GLUCOSE: 72 mg/dL (ref 65–99)
POTASSIUM: 4.1 mmol/L (ref 3.5–5.1)
Sodium: 141 mmol/L (ref 135–145)

## 2017-02-04 LAB — CBC
HEMATOCRIT: 38.8 % (ref 36.0–46.0)
Hemoglobin: 12.7 g/dL (ref 12.0–15.0)
MCH: 28 pg (ref 26.0–34.0)
MCHC: 32.7 g/dL (ref 30.0–36.0)
MCV: 85.5 fL (ref 78.0–100.0)
Platelets: 260 10*3/uL (ref 150–400)
RBC: 4.54 MIL/uL (ref 3.87–5.11)
RDW: 12.8 % (ref 11.5–15.5)
WBC: 5.1 10*3/uL (ref 4.0–10.5)

## 2017-02-04 LAB — HCG, SERUM, QUALITATIVE: Preg, Serum: NEGATIVE

## 2017-02-04 NOTE — Pre-Procedure Instructions (Signed)
Idonna N Boateng  02/04/2017      [facility] - [place], [place] [address] Phone: [phone] Fax: [place], [place] [address] [place] [address] Phone: [phone] Fax: [phone]    Your procedure is scheduled on 02-10-2017  Thursday    Report to [facility] Admitting at 5:30 A.M.  Call this number if you have problems the morning of surgery:  [phone]   Remember:  Do not eat food or drink liquids after midnight.   Take these medicines the morning of surgery with A SIP OF WATER Duloxetine(Cynbalta           STOP ASPIRIN,ANTIINFLAMATORIES (IBUPROFEN,ALEVE,MOTRIN,ADVIL,GOODY'S POWDERS),HERBAL SUPPLEMENTS,FISH OIL,AND VITAMINS 5-7 DAYS PRIOR TO SURGERY   Do not wear jewelry, make-up or nail polish.  Do not wear lotions, powders, or perfumes, or deoderant.  Do not shave 48 hours prior to surgery.   .  Do not bring valuables to the hospital.  [facility] is not responsible for any belongings or valuables.  Contacts, dentures or bridgework may not be worn into surgery.  Leave your suitcase in the car.  After surgery it may be brought to your room.  For patients admitted to the hospital, discharge time will be determined by your treatment team.  Patients discharged the day of surgery will not be allowed to drive home.    Special instructions:Drink 1 box of Boost 2 hours prior to arrival time 3:30 AM    [facility] - Preparing for Surgery  Before surgery, you can play an important role.  Because skin is not sterile, your skin needs to be as free of germs as possible.  You can reduce the number of germs on you skin by washing with CHG (chlorahexidine gluconate) soap before surgery.  CHG is an antiseptic cleaner which kills germs and bonds with the skin to continue killing germs even  after washing.  Please DO NOT use if you have an allergy to CHG or antibacterial soaps.  If your skin becomes reddened/irritated stop using the CHG and inform your nurse when you arrive at Short Stay.  Do not shave (including legs and underarms) for at least 48 hours prior to the first CHG shower.  You may shave your face.  Please follow these instructions carefully:   1.  Shower with CHG Soap the night before surgery and the                                morning of Surgery.  2.  If you choose to wash your hair, wash your hair first as usual with your       normal shampoo.  3.  After you shampoo, rinse your hair and body thoroughly to remove the                      Shampoo.  4.  Use CHG as you would any other liquid soap.  You can apply chg directly       to the skin and wash gently with scrungie or a clean washcloth.  5.  Apply the CHG Soap to your body ONLY FROM THE NECK DOWN.        Do not use on open wounds or open sores.  Avoid contact with your eyes,  ears, mouth and genitals (private parts).  Wash genitals (private parts)  with your normal soap.  6.  Wash thoroughly, paying special attention to the area where your surgery        will be performed.  7.  Thoroughly rinse your body with warm water from the neck down.  8.  DO NOT shower/wash with your normal soap after using and rinsing off       the CHG Soap.  9.  Pat yourself dry with a clean towel.            10.  Wear clean pajamas.            11.  Place clean sheets on your bed the night of your first shower and do not        sleep with pets.  Day of Surgery  Do not apply any lotions/deoderants the morning of surgery.  Please wear clean clothes to the hospital/surgery center.    Please read over the following fact sheets that you were given. Coughing and Deep Breathing and Surgical Site Infection Prevention

## 2017-02-09 ENCOUNTER — Ambulatory Visit
Admission: RE | Admit: 2017-02-09 | Discharge: 2017-02-09 | Disposition: A | Discharge status: Home / Self Care | Payer: 59 | Source: Ambulatory Visit | Attending: General Surgery | Admitting: General Surgery

## 2017-02-09 DIAGNOSIS — N6310 Unspecified lump in the right breast, unspecified quadrant: Secondary | ICD-10-CM

## 2017-02-09 NOTE — Anesthesia Preprocedure Evaluation (Addendum)
Anesthesia Evaluation  Patient identified by MRN, date of birth, ID band Patient awake    Reviewed: Allergy & Precautions, NPO status , Patient's Chart, lab work & pertinent test results  History of Anesthesia Complications (+) history of anesthetic complications  Airway Mallampati: II  TM Distance: >3 FB Neck ROM: Full    Dental no notable dental hx. (+) Teeth Intact   Pulmonary neg pulmonary ROS,    Pulmonary exam normal breath sounds clear to auscultation       Cardiovascular hypertension, Pt. on medications Normal cardiovascular exam Rhythm:Regular Rate:Normal     Neuro/Psych PSYCHIATRIC DISORDERS Anxiety Depression  Neuromuscular disease    GI/Hepatic negative GI ROS, Neg liver ROS,   Endo/Other  Right breast discharge   Renal/GU negative Renal ROS  negative genitourinary   Musculoskeletal  (+) Arthritis ,   Abdominal   Peds  Hematology negative hematology ROS (+)   Anesthesia Other Findings   Reproductive/Obstetrics                            Anesthesia Physical Anesthesia Plan  ASA: II  Anesthesia Plan: General   Post-op Pain Management:    Induction:   Airway Management Planned: LMA  Additional Equipment:   Intra-op Plan:   Post-operative Plan: Extubation in OR  Informed Consent: I have reviewed the patients History and Physical, chart, labs and discussed the procedure including the risks, benefits and alternatives for the proposed anesthesia with the patient or authorized representative who has indicated his/her understanding and acceptance.     Plan Discussed with: CRNA, Anesthesiologist and Surgeon  Anesthesia Plan Comments:        Anesthesia Quick Evaluation

## 2017-02-10 ENCOUNTER — Ambulatory Visit (HOSPITAL_COMMUNITY): Payer: 59 | Admitting: Certified Registered Nurse Anesthetist

## 2017-02-10 ENCOUNTER — Encounter (HOSPITAL_COMMUNITY): Payer: Self-pay | Admitting: Urology

## 2017-02-10 ENCOUNTER — Encounter (HOSPITAL_COMMUNITY)
Admission: RE | Disposition: A | Discharge status: Home / Self Care | Payer: Self-pay | Source: Ambulatory Visit | Attending: General Surgery

## 2017-02-10 ENCOUNTER — Ambulatory Visit (HOSPITAL_COMMUNITY)
Admission: RE | Admit: 2017-02-10 | Discharge: 2017-02-10 | Disposition: A | Discharge status: Home / Self Care | Payer: 59 | Source: Ambulatory Visit | Attending: General Surgery | Admitting: General Surgery

## 2017-02-10 ENCOUNTER — Ambulatory Visit
Admission: RE | Admit: 2017-02-10 | Discharge: 2017-02-10 | Disposition: A | Discharge status: Home / Self Care | Payer: 59 | Source: Ambulatory Visit | Attending: General Surgery | Admitting: General Surgery

## 2017-02-10 DIAGNOSIS — M1990 Unspecified osteoarthritis, unspecified site: Secondary | ICD-10-CM | POA: Insufficient documentation

## 2017-02-10 DIAGNOSIS — F329 Major depressive disorder, single episode, unspecified: Secondary | ICD-10-CM | POA: Diagnosis not present

## 2017-02-10 DIAGNOSIS — N6310 Unspecified lump in the right breast, unspecified quadrant: Secondary | ICD-10-CM

## 2017-02-10 DIAGNOSIS — Z880 Allergy status to penicillin: Secondary | ICD-10-CM | POA: Insufficient documentation

## 2017-02-10 DIAGNOSIS — Z803 Family history of malignant neoplasm of breast: Secondary | ICD-10-CM | POA: Diagnosis not present

## 2017-02-10 DIAGNOSIS — N62 Hypertrophy of breast: Secondary | ICD-10-CM | POA: Insufficient documentation

## 2017-02-10 DIAGNOSIS — N611 Abscess of the breast and nipple: Secondary | ICD-10-CM | POA: Diagnosis not present

## 2017-02-10 DIAGNOSIS — I10 Essential (primary) hypertension: Secondary | ICD-10-CM | POA: Diagnosis not present

## 2017-02-10 DIAGNOSIS — F419 Anxiety disorder, unspecified: Secondary | ICD-10-CM | POA: Insufficient documentation

## 2017-02-10 HISTORY — PX: BREAST LUMPECTOMY WITH RADIOACTIVE SEED LOCALIZATION: SHX6424

## 2017-02-10 SURGERY — BREAST LUMPECTOMY WITH RADIOACTIVE SEED LOCALIZATION
Anesthesia: General | Site: Breast | Laterality: Right

## 2017-02-10 MED ORDER — GABAPENTIN 300 MG PO CAPS
300.0000 mg | ORAL_CAPSULE | ORAL | Status: AC
  Administered 2017-02-10: 300 mg via ORAL

## 2017-02-10 MED ORDER — LACTATED RINGERS IV SOLN
INTRAVENOUS | Status: DC | PRN
  Administered 2017-02-10: 07:00:00 via INTRAVENOUS

## 2017-02-10 MED ORDER — FENTANYL CITRATE (PF) 100 MCG/2ML IJ SOLN: 25.0000 ug | INTRAMUSCULAR | Status: DC | PRN

## 2017-02-10 MED ORDER — CHLORHEXIDINE GLUCONATE CLOTH 2 % EX PADS: 6.0000 | MEDICATED_PAD | Freq: Once | CUTANEOUS | Status: DC

## 2017-02-10 MED ORDER — CELECOXIB 200 MG PO CAPS
400.0000 mg | ORAL_CAPSULE | ORAL | Status: AC
  Administered 2017-02-10: 400 mg via ORAL

## 2017-02-10 MED ORDER — VANCOMYCIN HCL IN DEXTROSE 1-5 GM/200ML-% IV SOLN
1000.0000 mg | INTRAVENOUS | Status: AC
  Administered 2017-02-10: 1000 mg via INTRAVENOUS

## 2017-02-10 MED ORDER — PHENYLEPHRINE HCL 10 MG/ML IJ SOLN
INTRAMUSCULAR | Status: DC | PRN
  Administered 2017-02-10: 80 ug via INTRAVENOUS
  Administered 2017-02-10: 120 ug via INTRAVENOUS

## 2017-02-10 MED ORDER — METOCLOPRAMIDE HCL 5 MG/ML IJ SOLN
INTRAMUSCULAR | Status: AC
  Filled 2017-02-10: qty 2

## 2017-02-10 MED ORDER — VANCOMYCIN HCL IN DEXTROSE 1-5 GM/200ML-% IV SOLN
INTRAVENOUS | Status: AC
  Filled 2017-02-10: qty 200

## 2017-02-10 MED ORDER — OXYCODONE HCL 5 MG/5ML PO SOLN: 5.0000 mg | Freq: Once | ORAL | Status: DC | PRN

## 2017-02-10 MED ORDER — HYDROCODONE-ACETAMINOPHEN 5-325 MG PO TABS: 1.0000 | ORAL_TABLET | ORAL | 0 refills | Status: DC | PRN

## 2017-02-10 MED ORDER — OXYCODONE HCL 5 MG PO TABS: 5.0000 mg | ORAL_TABLET | Freq: Once | ORAL | Status: DC | PRN

## 2017-02-10 MED ORDER — BUPIVACAINE-EPINEPHRINE 0.25% -1:200000 IJ SOLN
INTRAMUSCULAR | Status: DC | PRN
  Administered 2017-02-10: 20 mL

## 2017-02-10 MED ORDER — BUPIVACAINE HCL (PF) 0.25 % IJ SOLN
INTRAMUSCULAR | Status: AC
  Filled 2017-02-10: qty 150

## 2017-02-10 MED ORDER — MEPERIDINE HCL 25 MG/ML IJ SOLN: 6.2500 mg | INTRAMUSCULAR | Status: DC | PRN

## 2017-02-10 MED ORDER — GABAPENTIN 300 MG PO CAPS
ORAL_CAPSULE | ORAL | Status: AC
  Administered 2017-02-10: 300 mg via ORAL
  Filled 2017-02-10: qty 1

## 2017-02-10 MED ORDER — FENTANYL CITRATE (PF) 100 MCG/2ML IJ SOLN
INTRAMUSCULAR | Status: AC
  Filled 2017-02-10: qty 4

## 2017-02-10 MED ORDER — PROPOFOL 10 MG/ML IV BOLUS
INTRAVENOUS | Status: AC
  Filled 2017-02-10: qty 20

## 2017-02-10 MED ORDER — METOCLOPRAMIDE HCL 5 MG/ML IJ SOLN
10.0000 mg | Freq: Once | INTRAMUSCULAR | Status: AC | PRN
  Administered 2017-02-10: 10 mg via INTRAVENOUS

## 2017-02-10 MED ORDER — PROPOFOL 10 MG/ML IV BOLUS
INTRAVENOUS | Status: DC | PRN
  Administered 2017-02-10: 150 mg via INTRAVENOUS

## 2017-02-10 MED ORDER — DEXAMETHASONE SODIUM PHOSPHATE 10 MG/ML IJ SOLN
INTRAMUSCULAR | Status: DC | PRN
  Administered 2017-02-10: 5 mg via INTRAVENOUS

## 2017-02-10 MED ORDER — FENTANYL CITRATE (PF) 100 MCG/2ML IJ SOLN
INTRAMUSCULAR | Status: DC | PRN
  Administered 2017-02-10 (×2): 50 ug via INTRAVENOUS

## 2017-02-10 MED ORDER — ONDANSETRON HCL 4 MG/2ML IJ SOLN
INTRAMUSCULAR | Status: DC | PRN
  Administered 2017-02-10: 4 mg via INTRAVENOUS

## 2017-02-10 MED ORDER — MIDAZOLAM HCL 2 MG/2ML IJ SOLN
INTRAMUSCULAR | Status: AC
  Filled 2017-02-10: qty 2

## 2017-02-10 MED ORDER — 0.9 % SODIUM CHLORIDE (POUR BTL) OPTIME
TOPICAL | Status: DC | PRN
  Administered 2017-02-10: 1000 mL

## 2017-02-10 MED ORDER — CELECOXIB 200 MG PO CAPS
ORAL_CAPSULE | ORAL | Status: AC
Start: 2017-02-10 — End: 2017-02-10
  Administered 2017-02-10: 400 mg via ORAL
  Filled 2017-02-10: qty 2

## 2017-02-10 MED ORDER — GLYCOPYRROLATE 0.2 MG/ML IJ SOLN
INTRAMUSCULAR | Status: DC | PRN
  Administered 2017-02-10: .2 mg via INTRAVENOUS

## 2017-02-10 MED ORDER — LIDOCAINE 2% (20 MG/ML) 5 ML SYRINGE
INTRAMUSCULAR | Status: DC | PRN
  Administered 2017-02-10: 60 mg via INTRAVENOUS

## 2017-02-10 MED ORDER — ACETAMINOPHEN 500 MG PO TABS
ORAL_TABLET | ORAL | Status: AC
  Administered 2017-02-10: 1000 mg via ORAL
  Filled 2017-02-10: qty 2

## 2017-02-10 MED ORDER — EPHEDRINE SULFATE 50 MG/ML IJ SOLN
INTRAMUSCULAR | Status: DC | PRN
  Administered 2017-02-10 (×2): 15 mg via INTRAVENOUS

## 2017-02-10 MED ORDER — ACETAMINOPHEN 500 MG PO TABS
1000.0000 mg | ORAL_TABLET | ORAL | Status: AC
  Administered 2017-02-10: 1000 mg via ORAL

## 2017-02-10 SURGICAL SUPPLY — 43 items
ADH SKN CLS APL DERMABOND .7 (GAUZE/BANDAGES/DRESSINGS) ×1
APPLIER CLIP 9.375 MED OPEN (MISCELLANEOUS) ×3
APR CLP MED 9.3 20 MLT OPN (MISCELLANEOUS) ×1
BLADE SURG 15 STRL LF DISP TIS (BLADE) ×1 IMPLANT
BLADE SURG 15 STRL SS (BLADE) ×3
CANISTER SUCTION 2500CC (MISCELLANEOUS) ×3 IMPLANT
CHLORAPREP W/TINT 26ML (MISCELLANEOUS) ×3 IMPLANT
CLIP APPLIE 9.375 MED OPEN (MISCELLANEOUS) IMPLANT
COVER PROBE W GEL 5X96 (DRAPES) ×3 IMPLANT
COVER SURGICAL LIGHT HANDLE (MISCELLANEOUS) ×3 IMPLANT
DERMABOND ADVANCED (GAUZE/BANDAGES/DRESSINGS) ×2
DERMABOND ADVANCED .7 DNX12 (GAUZE/BANDAGES/DRESSINGS) ×1 IMPLANT
DEVICE DUBIN SPECIMEN MAMMOGRA (MISCELLANEOUS) ×3 IMPLANT
DRAPE CHEST BREAST 15X10 FENES (DRAPES) ×3 IMPLANT
DRAPE UTILITY XL STRL (DRAPES) ×3 IMPLANT
ELECT COATED BLADE 2.86 ST (ELECTRODE) ×3 IMPLANT
ELECT REM PT RETURN 9FT ADLT (ELECTROSURGICAL) ×3
ELECTRODE REM PT RTRN 9FT ADLT (ELECTROSURGICAL) ×1 IMPLANT
GLOVE BIO SURGEON STRL SZ7.5 (GLOVE) ×6 IMPLANT
GLOVE BIOGEL PI IND STRL 7.0 (GLOVE) IMPLANT
GLOVE BIOGEL PI INDICATOR 7.0 (GLOVE) ×2
GLOVE SURG SS PI 7.0 STRL IVOR (GLOVE) ×2 IMPLANT
GOWN STRL REUS W/ TWL LRG LVL3 (GOWN DISPOSABLE) ×2 IMPLANT
GOWN STRL REUS W/TWL LRG LVL3 (GOWN DISPOSABLE) ×6
KIT BASIN OR (CUSTOM PROCEDURE TRAY) ×3 IMPLANT
KIT MARKER MARGIN INK (KITS) ×3 IMPLANT
LIGHT WAVEGUIDE WIDE FLAT (MISCELLANEOUS) IMPLANT
NDL HYPO 25X1 1.5 SAFETY (NEEDLE) ×1 IMPLANT
NEEDLE HYPO 25X1 1.5 SAFETY (NEEDLE) ×3 IMPLANT
NS IRRIG 1000ML POUR BTL (IV SOLUTION) ×3 IMPLANT
PACK SURGICAL SETUP 50X90 (CUSTOM PROCEDURE TRAY) ×3 IMPLANT
PENCIL BUTTON HOLSTER BLD 10FT (ELECTRODE) ×3 IMPLANT
SPONGE LAP 18X18 X RAY DECT (DISPOSABLE) ×3 IMPLANT
SUT MNCRL AB 4-0 PS2 18 (SUTURE) ×3 IMPLANT
SUT SILK 2 0 SH (SUTURE) IMPLANT
SUT VIC AB 3-0 SH 18 (SUTURE) ×3 IMPLANT
SYR BULB 3OZ (MISCELLANEOUS) ×3 IMPLANT
SYR CONTROL 10ML LL (SYRINGE) ×3 IMPLANT
TOWEL OR 17X24 6PK STRL BLUE (TOWEL DISPOSABLE) ×3 IMPLANT
TOWEL OR 17X26 10 PK STRL BLUE (TOWEL DISPOSABLE) ×3 IMPLANT
TUBE CONNECTING 12'X1/4 (SUCTIONS) ×1
TUBE CONNECTING 12X1/4 (SUCTIONS) ×2 IMPLANT
YANKAUER SUCT BULB TIP NO VENT (SUCTIONS) ×3 IMPLANT

## 2017-02-10 NOTE — Op Note (Signed)
02/10/2017  8:32 AM  PATIENT:  Stephanie Gray  51 y.o. female  PRE-OPERATIVE DIAGNOSIS:  RIGHT BREAST PASH  POST-OPERATIVE DIAGNOSIS:  RIGHT BREAST PASH  PROCEDURE:  Procedure(s): RIGHT BREAST LUMPECTOMY WITH RADIOACTIVE SEED LOCALIZATION (Right)  SURGEON:  Surgeon(s) and Role:    * Jovita Kussmaul, MD - Primary  PHYSICIAN ASSISTANT:   ASSISTANTS: none   ANESTHESIA:   local and general  EBL:  Total I/O In: -  Out: 20 [Blood:20]  BLOOD ADMINISTERED:none  DRAINS: none   LOCAL MEDICATIONS USED:  MARCAINE     SPECIMEN:  Source of Specimen:  right breast tissue with additional medial and superior margin  DISPOSITION OF SPECIMEN:  PATHOLOGY  COUNTS:  YES  TOURNIQUET:  * No tourniquets in log *  DICTATION: .Dragon Dictation   After informed consent was obtained the patient was brought to the operating room and placed in the supine position on the operating room table. After adequate induction of general anesthesia the patient's right breast was prepped with ChloraPrep, allowed to dry, and draped in usual sterile manner. An appropriate timeout was performed. Previously an I-125 seed was placed in the upper portion of the right breast to mark an area of pseudo-angiomatous stromal hyperplasia. The neoprobe was set to I-125 in the area of radioactivity was readily identified. A curvilinear incision was made along the upper outer edge of the areola. This incision was carried through the skin and subcutaneous tissue sharply with electrocautery. The dissection was then carried out sharply with the electrocautery towards the radioactive seed as directed by the neoprobe. Once I approached the radioactive seed then a circular portion of breast tissue was excised sharply around the radioactive seed while checking the area of radioactivity frequently with the neoprobe. During this dissection we did come across the seed in the seed was removed and sent separately to pathology. Once the main  specimen was removed it was oriented with the appropriate paint colors. A specimen radiograph was obtained that showed one of the clips to be near the center of the specimen. An additional medial and superior margins were removed sharply with electrocautery and these were x-rayed separately and we did see the second clip in the superior margin. These were all sent to pathology for further evaluation. Hemostasis was achieved using the Bovie electrocautery. The wound was then infiltrated with more quarter percent Marcaine and irrigated with saline. The deep layer of the wound was then closed with layers of interrupted 3-0 Vicryl stitches. The skin was then closed with interrupted 4-0 Monocryl subcuticular stitches. Dermabond dressings were applied. The patient tolerated the procedure well. At the end of the case all needle sponge and instrument counts were correct. The patient was then awakened and taken to recovery in stable condition.  PLAN OF CARE: Discharge to home after PACU  PATIENT DISPOSITION:  PACU - hemodynamically stable.   Delay start of Pharmacological VTE agent (>24hrs) due to surgical blood loss or risk of bleeding: not applicable

## 2017-02-10 NOTE — Interval H&P Note (Signed)
History and Physical Interval Note:  02/10/2017 7:21 AM  Stephanie Gray  has presented today for surgery, with the diagnosis of RIGHT BREAST [place]  The various methods of treatment have been discussed with the patient and family. After consideration of risks, benefits and other options for treatment, the patient has consented to  Procedure(s): RIGHT BREAST LUMPECTOMY WITH RADIOACTIVE SEED LOCALIZATION (Right) as a surgical intervention .  The patient's history has been reviewed, patient examined, no change in status, stable for surgery.  I have reviewed the patient's chart and labs.  Questions were answered to the patient's satisfaction.     TOTH III, S

## 2017-02-10 NOTE — Transfer of Care (Signed)
Immediate Anesthesia Transfer of Care Note  Patient: Stephanie Gray  Procedure(s) Performed: Procedure(s): RIGHT BREAST LUMPECTOMY WITH RADIOACTIVE SEED LOCALIZATION (Right)  Patient Location: PACU  Anesthesia Type:General  Level of Consciousness: awake, alert , oriented and patient cooperative  Airway & Oxygen Therapy: Patient Spontanous Breathing and Patient connected to nasal cannula oxygen  Post-op Assessment: Report given to RN and Post -op Vital signs reviewed and stable  Post vital signs: Reviewed and stable  Last Vitals:  Vitals:   02/10/17 0623  BP: (!) 132/92  Pulse: (!) 55  Resp: 20  Temp: 36.4 C    Last Pain:  Vitals:   02/10/17 0623  TempSrc: Oral         Complications: No apparent anesthesia complications

## 2017-02-10 NOTE — Anesthesia Procedure Notes (Addendum)
Procedure Name: LMA Insertion Date/Time: 02/10/2017 7:38 AM Performed by: Salli Quarry  Pre-anesthesia Checklist: Patient identified, Emergency Drugs available, Suction available and Patient being monitored Patient Re-evaluated:Patient Re-evaluated prior to inductionOxygen Delivery Method: Circle System Utilized Preoxygenation: Pre-oxygenation with 100% oxygen Intubation Type: IV induction Ventilation: Mask ventilation without difficulty LMA: LMA inserted LMA Size: 3.0 Number of attempts: 1 Placement Confirmation: positive ETCO2 Tube secured with: Tape Dental Injury: Teeth and Oropharynx as per pre-operative assessment

## 2017-02-10 NOTE — H&P (Addendum)
[place]  Location: [facility] Surgery Patient #: [id number] DOB: 1966-04-14 Married / Language: English / Race: White Female   History of Present Illness  The patient is a 51 year old female who presents for a follow-up for Breast pain. The patient is a 51 year old white female who has a history of recurrent right breast infections. Since her last visit she underwent MRI evaluation and was found to have 2 small enhancing masses in the upper portion of the right breast. These were biopsied and one came back as fibrocystic disease in the other as fibrocystic disease with pseudoangiomatous stromal hyperplasia. She also had other small enhancing lesions in the breast that were poorly defined. Radiology recommended a 6 month follow-up MRI for these if she does not have a mastectomy. She otherwise feels as though she frequently gets called back for repeat imaging because of her breast density and also complains of intermittent breast pain. She does have a family history of breast cancer in her grandmother and a cousin.   Allergies  Penicillin V *PENICILLINS*  Rash. Bactrim *ANTI-INFECTIVE AGENTS - MISC.*   Medication History Ibuprofen (300MG  Tablet, Oral as needed) Active. Medications Reconciled    Review of Systems General Present- Night Sweats. Not Present- Appetite Loss, Chills, Fatigue, Fever, Weight Gain and Weight Loss. Skin Not Present- Change in Wart/Mole, Dryness, Hives, Jaundice, New Lesions, Non-Healing Wounds, Rash and Ulcer. HEENT Present- Wears glasses/contact lenses. Not Present- Earache, Hearing Loss, Hoarseness, Nose Bleed, Oral Ulcers, Ringing in the Ears, Seasonal Allergies, Sinus Pain, Sore Throat, Visual Disturbances and Yellow Eyes. Breast Present- Breast Mass, Nipple Discharge and Skin Changes. Not Present- Breast Pain. Cardiovascular Not Present- Chest Pain, Difficulty Breathing Lying Down, Leg Cramps, Palpitations, Rapid Heart Rate, Shortness  of Breath and Swelling of Extremities. Gastrointestinal Not Present- Abdominal Pain, Bloating, Bloody Stool, Change in Bowel Habits, Chronic diarrhea, Constipation, Difficulty Swallowing, Excessive gas, Gets full quickly at meals, Hemorrhoids, Indigestion, Nausea, Rectal Pain and Vomiting. Female Genitourinary Not Present- Frequency, Nocturia, Painful Urination, Pelvic Pain and Urgency. Musculoskeletal Not Present- Back Pain, Joint Pain, Joint Stiffness, Muscle Pain, Muscle Weakness and Swelling of Extremities. Neurological Not Present- Decreased Memory, Fainting, Headaches, Numbness, Seizures, Tingling, Tremor, Trouble walking and Weakness. Psychiatric Present- Depression. Not Present- Anxiety, Bipolar, Change in Sleep Pattern, Fearful and Frequent crying. Endocrine Not Present- Cold Intolerance, Excessive Hunger, Hair Changes, Heat Intolerance, Hot flashes and New Diabetes. Hematology Not Present- Blood Thinners, Easy Bruising, Excessive bleeding, Gland problems, HIV and Persistent Infections.  Vitals  Weight: 150 lb Height: 60in Body Surface Area: 1.65 m Body Mass Index: 29.29 kg/m  Temp.: 37F  Pulse: 80 (Regular)  BP: 128/78 (Sitting, Left Arm, Standard)       Physical Exam General Mental Status-Alert. General Appearance-Consistent with stated age. Hydration-Well hydrated. Voice-Normal.  Head and Neck Head-normocephalic, atraumatic with no lesions or palpable masses. Trachea-midline. Thyroid Gland Characteristics - normal size and consistency.  Eye Eyeball - Bilateral-Extraocular movements intact. Sclera/Conjunctiva - Bilateral-No scleral icterus.  Chest and Lung Exam Chest and lung exam reveals -quiet, even and easy respiratory effort with no use of accessory muscles and on auscultation, normal breath sounds, no adventitious sounds and normal vocal resonance. Inspection Chest Wall - Normal. Back - normal.  Breast Note: There is no  palpable mass in either breast. There is no palpable axillary, supraclavicular, or cervical lymphadenopathy.   Cardiovascular Cardiovascular examination reveals -normal heart sounds, regular rate and rhythm with no murmurs and normal pedal pulses bilaterally.  Abdomen Inspection Inspection  of the abdomen reveals - No Hernias. Skin - Scar - no surgical scars. Palpation/Percussion Palpation and Percussion of the abdomen reveal - Soft, Non Tender, No Rebound tenderness, No Rigidity (guarding) and No hepatosplenomegaly. Auscultation Auscultation of the abdomen reveals - Bowel sounds normal.  Neurologic Neurologic evaluation reveals -alert and oriented x 3 with no impairment of recent or remote memory. Mental Status-Normal.  Musculoskeletal Normal Exam - Left-Upper Extremity Strength Normal and Lower Extremity Strength Normal. Normal Exam - Right-Upper Extremity Strength Normal and Lower Extremity Strength Normal.  Lymphatic Head & Neck  General Head & Neck Lymphatics: Bilateral - Description - Normal. Axillary  General Axillary Region: Bilateral - Description - Normal. Tenderness - Non Tender. Femoral & Inguinal  Generalized Femoral & Inguinal Lymphatics: Bilateral - Description - Normal. Tenderness - Non Tender.    Assessment & Plan  BREAST ABSCESS OF FEMALE (N61.1) PSEUDOANGIOMATOUS STROMAL HYPERPLASIA OF BREAST (N62) Impression: The patient has had recurrent right breast abscesses and was found to have pseudo-angiomatous stromal hyperplasia in the upper portion of the right breast on recent biopsy. Because this can be considered a high risk lesion and because of the abnormality seen I would recommend that this area be removed. I have talked to her in detail about the different options for treatment and at this point she is undecided between lumpectomy and mastectomy. I have discussed with her in detail the risks and benefits of both operations as well as some of the  technical aspects and she understands. She will call us back when she has made a decision. She is considering mastectomy we will be happy to refer to plastics to talk about reconstruction. If she chooses breast conservation then I will also send her to the high risk clinic at the cancer center to talk about risk reduction. Current Plans Referred to Oncology, for evaluation and follow up (Oncology). Routine. She has decided on breast conservation

## 2017-02-10 NOTE — Anesthesia Postprocedure Evaluation (Signed)
Anesthesia Post Note  Patient: Stephanie Gray  Procedure(s) Performed: Procedure(s) (LRB): RIGHT BREAST LUMPECTOMY WITH RADIOACTIVE SEED LOCALIZATION (Right)  Patient location during evaluation: PACU Anesthesia Type: General Level of consciousness: awake and alert and oriented Pain management: pain level controlled Vital Signs Assessment: post-procedure vital signs reviewed and stable Respiratory status: spontaneous breathing, nonlabored ventilation and respiratory function stable Cardiovascular status: blood pressure returned to baseline and stable Postop Assessment: no signs of nausea or vomiting Anesthetic complications: no       Last Vitals:  Vitals:   02/10/17 0917 02/10/17 0936  BP: 101/75 114/79  Pulse: 84 76  Resp: 20 15  Temp: 36.6 C     Last Pain:  Vitals:   02/10/17 0623  TempSrc: Oral                 , A.

## 2017-02-11 ENCOUNTER — Encounter (HOSPITAL_COMMUNITY): Payer: Self-pay | Admitting: General Surgery

## 2017-02-18 ENCOUNTER — Ambulatory Visit: Payer: 59 | Admitting: Family

## 2017-02-21 ENCOUNTER — Ambulatory Visit (INDEPENDENT_AMBULATORY_CARE_PROVIDER_SITE_OTHER): Payer: 59 | Admitting: Family

## 2017-02-21 ENCOUNTER — Encounter: Payer: Self-pay | Admitting: Family

## 2017-02-21 VITALS — BP 142/98 | HR 84 | Temp 98.2°F | Resp 16 | Ht 61.25 in | Wt 148.1 lb

## 2017-02-21 DIAGNOSIS — I10 Essential (primary) hypertension: Secondary | ICD-10-CM | POA: Diagnosis not present

## 2017-02-21 MED ORDER — LOSARTAN POTASSIUM 100 MG PO TABS: 100.0000 mg | ORAL_TABLET | Freq: Every day | ORAL | 0 refills | Status: DC

## 2017-02-21 NOTE — Patient Instructions (Signed)
Thank you for choosing [facility].  SUMMARY AND INSTRUCTIONS:  Medication:  Increase your losartan to 100 mg.  Your prescription(s) have been submitted to your pharmacy or been printed and provided for you. Please take as directed and contact our office if you believe you are having problem(s) with the medication(s) or have any questions.  Follow up:  If your symptoms worsen or fail to improve, please contact our office for further instruction, or in case of emergency go directly to the emergency room at the closest medical facility.     DASH Eating Plan DASH stands for "Dietary Approaches to Stop Hypertension." The DASH eating plan is a healthy eating plan that has been shown to reduce high blood pressure (hypertension). It may also reduce your risk for type 2 diabetes, heart disease, and stroke. The DASH eating plan may also help with weight loss. What are tips for following this plan? General guidelines   Avoid eating more than 2,300 mg (milligrams) of salt (sodium) a day. If you have hypertension, you may need to reduce your sodium intake to 1,500 mg a day.  Limit alcohol intake to no more than 1 drink a day for nonpregnant women and 2 drinks a day for men. One drink equals 12 oz of beer, 5 oz of wine, or 1 oz of hard liquor.  Work with your health care provider to maintain a healthy body weight or to lose weight. Ask what an ideal weight is for you.  Get at least 30 minutes of exercise that causes your heart to beat faster (aerobic exercise) most days of the week. Activities may include walking, swimming, or biking.  Work with your health care provider or diet and nutrition specialist (dietitian) to adjust your eating plan to your individual calorie needs. Reading food labels   Check food labels for the amount of sodium per serving. Choose foods with less than 5 percent of the Daily Value of sodium. Generally, foods with less than 300 mg of sodium per serving fit into  this eating plan.  To find whole grains, look for the word "whole" as the first word in the ingredient list. Shopping   Buy products labeled as "low-sodium" or "no salt added."  Buy fresh foods. Avoid canned foods and premade or frozen meals. Cooking   Avoid adding salt when cooking. Use salt-free seasonings or herbs instead of table salt or sea salt. Check with your health care provider or pharmacist before using salt substitutes.  Do not fry foods. Cook foods using healthy methods such as baking, boiling, grilling, and broiling instead.  Cook with heart-healthy oils, such as olive, canola, soybean, or sunflower oil. Meal planning    Eat a balanced diet that includes:  5 or more servings of fruits and vegetables each day. At each meal, try to fill half of your plate with fruits and vegetables.  Up to 6-8 servings of whole grains each day.  Less than 6 oz of lean meat, poultry, or fish each day. A 3-oz serving of meat is about the same size as a deck of cards. One egg equals 1 oz.  2 servings of low-fat dairy each day.  A serving of nuts, seeds, or beans 5 times each week.  Heart-healthy fats. Healthy fats called Omega-3 fatty acids are found in foods such as flaxseeds and coldwater fish, like sardines, salmon, and mackerel.  Limit how much you eat of the following:  Canned or prepackaged foods.  Food that is high in trans  fat, such as fried foods.  Food that is high in saturated fat, such as fatty meat.  Sweets, desserts, sugary drinks, and other foods with added sugar.  Full-fat dairy products.  Do not salt foods before eating.  Try to eat at least 2 vegetarian meals each week.  Eat more home-cooked food and less restaurant, buffet, and fast food.  When eating at a restaurant, ask that your food be prepared with less salt or no salt, if possible. What foods are recommended? The items listed may not be a complete list. Talk with your dietitian about what dietary  choices are best for you. Grains  Whole-grain or whole-wheat bread. Whole-grain or whole-wheat pasta. Brown rice. [place]. Bulgur. Whole-grain and low-sodium cereals. Pita bread. Low-fat, low-sodium crackers. Whole-wheat flour tortillas. Vegetables  Fresh or frozen vegetables (raw, steamed, roasted, or grilled). Low-sodium or reduced-sodium tomato and vegetable juice. Low-sodium or reduced-sodium tomato sauce and tomato paste. Low-sodium or reduced-sodium canned vegetables. Fruits  All fresh, dried, or frozen fruit. Canned fruit in natural juice (without added sugar). Meat and other protein foods  Skinless chicken or [place]. Ground chicken or [place]. Pork with fat trimmed off. Fish and seafood. Egg whites. Dried beans, peas, or lentils. Unsalted nuts, nut butters, and seeds. Unsalted canned beans. Lean cuts of beef with fat trimmed off. Low-sodium, lean deli meat. Dairy  Low-fat (1%) or fat-free (skim) milk. Fat-free, low-fat, or reduced-fat cheeses. Nonfat, low-sodium ricotta or cottage cheese. Low-fat or nonfat yogurt. Low-fat, low-sodium cheese. Fats and oils  Soft margarine without trans fats. Vegetable oil. Low-fat, reduced-fat, or light mayonnaise and salad dressings (reduced-sodium). Canola, safflower, olive, soybean, and sunflower oils. Avocado. Seasoning and other foods  Herbs. Spices. Seasoning mixes without salt. Unsalted popcorn and pretzels. Fat-free sweets. What foods are not recommended? The items listed may not be a complete list. Talk with your dietitian about what dietary choices are best for you. Grains  Baked goods made with fat, such as croissants, muffins, or some breads. Dry pasta or rice meal packs. Vegetables  Creamed or fried vegetables. Vegetables in a cheese sauce. Regular canned vegetables (not low-sodium or reduced-sodium). Regular canned tomato sauce and paste (not low-sodium or reduced-sodium). Regular tomato and vegetable juice (not low-sodium or  reduced-sodium). Angie Fava. Olives. Fruits  Canned fruit in a light or heavy syrup. Fried fruit. Fruit in cream or butter sauce. Meat and other protein foods  Fatty cuts of meat. Ribs. Fried meat. Berniece Salines. Sausage. Bologna and other processed lunch meats. Salami. Fatback. Hotdogs. Bratwurst. Salted nuts and seeds. Canned beans with added salt. Canned or smoked fish. Whole eggs or egg yolks. Chicken or [place] with skin. Dairy  Whole or 2% milk, cream, and half-and-half. Whole or full-fat cream cheese. Whole-fat or sweetened yogurt. Full-fat cheese. Nondairy creamers. Whipped toppings. Processed cheese and cheese spreads. Fats and oils  Butter. Stick margarine. Lard. Shortening. Ghee. Bacon fat. Tropical oils, such as coconut, palm kernel, or palm oil. Seasoning and other foods  Salted popcorn and pretzels. Onion salt, garlic salt, seasoned salt, table salt, and sea salt. Worcestershire sauce. Tartar sauce. Barbecue sauce. Teriyaki sauce. Soy sauce, including reduced-sodium. Steak sauce. Canned and packaged gravies. Fish sauce. Oyster sauce. Cocktail sauce. Horseradish that you find on the shelf. Ketchup. Mustard. Meat flavorings and tenderizers. Bouillon cubes. Hot sauce and Tabasco sauce. Premade or packaged marinades. Premade or packaged taco seasonings. Relishes. Regular salad dressings. Where to find more information:  National Heart, Lung, and [place]: https://wilson-eaton.com/  American Heart Association:  www.heart.org Summary  The DASH eating plan is a healthy eating plan that has been shown to reduce high blood pressure (hypertension). It may also reduce your risk for type 2 diabetes, heart disease, and stroke.  With the DASH eating plan, you should limit salt (sodium) intake to 2,300 mg a day. If you have hypertension, you may need to reduce your sodium intake to 1,500 mg a day.  When on the DASH eating plan, aim to eat more fresh fruits and vegetables, whole grains, lean proteins, low-fat  dairy, and heart-healthy fats.  Work with your health care provider or diet and nutrition specialist (dietitian) to adjust your eating plan to your individual calorie needs. This information is not intended to replace advice given to you by your health care provider. Make sure you discuss any questions you have with your health care provider. Document Released: 11/25/2011 Document Revised: 11/29/2016 Document Reviewed: 11/29/2016 Elsevier Interactive Patient Education  2017 [employer].

## 2017-02-21 NOTE — Assessment & Plan Note (Signed)
Blood pressure elevated above goal 140/90 with current medication regimen and no adverse side effects. Has significant amount of stress and lifestyle changes in the past several weeks most likely contributing to her elevated blood pressures. Increase losartan to 100 mg daily. Denies worst headache of life with no new symptoms of end organ damage to physical exam. Encouraged to monitor blood pressure at home and follow low-sodium diet. Continue to monitor.

## 2017-02-21 NOTE — Progress Notes (Signed)
Subjective:    Patient ID: Stephanie Gray, female    DOB: 19-May-1966, 51 y.o.   MRN: [id number]  Chief Complaint  Patient presents with  . Hypertension    having issues with high BP, was going to get a procedure and her BP was 170/125    HPI:  [name] is a 51 y.o. female who  has a past medical history of Anxiety; Arthritis; Cervical disc disease; Complication of anesthesia; Depression; Hypertension; Neuromuscular disorder ([place]); and Thyroid disease. and presents today for an acute office visit.   Hypertension - Recently was noted to have elevated blood pressure when she was noted to have a blood pressure reading of 170/125. Reports taking the losartan as prescribed and denies adverse side effects. Notes she has been under a significant amount of stress secondary to hormone changes. Does not currently check her blood pressure at home. Denies worst headache of life with no symptoms of end organ damage. Working on a low sodium diet. Usually very active with the exception of the last 2 weeks.   BP Readings from Last 3 Encounters:  02/21/17 (!) 142/98  02/10/17 114/79  02/04/17 140/88     Allergies  Allergen Reactions  . Benzodiazepines Other (See Comments)    Patient needed crash cart after tubal ligation  . Other Other (See Comments)    Patient is extremely sensitive to all medicines, maybe needs 1/4 or 1/8 dosage of normal adult  . Penicillins Rash    Rash because of a history of documented adverse serious drug reaction;Medi Alert bracelet  is recommended (CHILDHOOD REACTION) Has patient had a PCN reaction causing immediate rash, facial/tongue/throat swelling, SOB or lightheadedness with hypotension:UNKNOWN Has patient had a PCN reaction causing severe rash involving mucus membranes or skin necrosis:unsure Has patient had a PCN reaction that required hospitalization:unsure Has patient had a PCN reaction occurring within the last 10 years:unsure        Outpatient Medications Prior to Visit  Medication Sig Dispense Refill  . DULoxetine (CYMBALTA) 60 MG capsule Take 60 mg by mouth daily.    Marland Kitchen losartan (COZAAR) 50 MG tablet Take 1 tablet (50 mg total) by mouth daily. 90 tablet 2  . HYDROcodone-acetaminophen (NORCO/VICODIN) 5-325 MG tablet Take 1-2 tablets by mouth every 4 (four) hours as needed for moderate pain or severe pain. 20 tablet 0  . ibuprofen (ADVIL,MOTRIN) 200 MG tablet Take 200-400 mg by mouth daily as needed (FOR PAIN/HEADACHE/FEVER).      No facility-administered medications prior to visit.      Review of Systems  Constitutional: Negative for chills and fever.  Eyes:       Negative for changes in vision  Respiratory: Negative for cough, chest tightness and wheezing.   Cardiovascular: Negative for chest pain, palpitations and leg swelling.  Neurological: Negative for dizziness, weakness and light-headedness.      Objective:    BP (!) 142/98 (BP Location: Left Arm, Patient Position: Sitting, Cuff Size: Normal)   Pulse 84   Temp 98.2 F (36.8 C) (Oral)   Resp 16   Ht 5' 1.25" (1.556 m)   Wt 148 lb 1.9 oz (67.2 kg)   SpO2 97%   BMI 27.76 kg/m  Nursing note and vital signs reviewed.  Physical Exam  Constitutional: She is oriented to person, place, and time. She appears well-developed and well-nourished. No distress.  Cardiovascular: Normal rate, regular rhythm, normal heart sounds and intact distal pulses.   Pulmonary/Chest: Effort normal and breath sounds  normal.  Neurological: She is alert and oriented to person, place, and time.  Skin: Skin is warm and dry.  Psychiatric: She has a normal mood and affect. Her behavior is normal. Judgment and thought content normal.       Assessment & Plan:   Problem List Items Addressed This Visit      Cardiovascular and Mediastinum   Hypertension - Primary    Blood pressure elevated above goal 140/90 with current medication regimen and no adverse side effects.  Has significant amount of stress and lifestyle changes in the past several weeks most likely contributing to her elevated blood pressures. Increase losartan to 100 mg daily. Denies worst headache of life with no new symptoms of end organ damage to physical exam. Encouraged to monitor blood pressure at home and follow low-sodium diet. Continue to monitor.      Relevant Medications   losartan (COZAAR) 100 MG tablet       I have discontinued Ms. Luhrs's ibuprofen and HYDROcodone-acetaminophen. I have also changed her losartan. Additionally, I am having her maintain her DULoxetine.   Meds ordered this encounter  Medications  . losartan (COZAAR) 100 MG tablet    Sig: Take 1 tablet (100 mg total) by mouth daily.    Dispense:  90 tablet    Refill:  0    Order Specific Question:   Supervising Provider    Answer:   Pricilla Holm A [id number]     Follow-up: Return in about 1 month (around 03/24/2017), or if symptoms worsen or fail to improve.  Mauricio Po, FNP

## 2017-03-18 ENCOUNTER — Telehealth: Payer: Self-pay | Admitting: Hematology and Oncology

## 2017-03-18 ENCOUNTER — Encounter: Payer: Self-pay | Admitting: Hematology and Oncology

## 2017-03-18 NOTE — Telephone Encounter (Signed)
Scheduled the pt to see Dr. Lindi Adie on 4/25 at 345pm. Unable to leave a vm because it was full. Letter mailed to the pt.

## 2017-03-28 ENCOUNTER — Encounter: Payer: Self-pay | Admitting: Family

## 2017-03-28 ENCOUNTER — Ambulatory Visit (INDEPENDENT_AMBULATORY_CARE_PROVIDER_SITE_OTHER): Payer: 59 | Admitting: Family

## 2017-03-28 VITALS — BP 138/88 | HR 76 | Temp 98.3°F | Wt 151.0 lb

## 2017-03-28 DIAGNOSIS — N611 Abscess of the breast and nipple: Secondary | ICD-10-CM

## 2017-03-28 DIAGNOSIS — I10 Essential (primary) hypertension: Secondary | ICD-10-CM

## 2017-03-28 NOTE — Progress Notes (Signed)
Subjective:    Patient ID: Stephanie Gray, female    DOB: [birth year], 51 y.o.   MRN: [id number]  Chief Complaint  Patient presents with  . Follow-up    bp recheck---doubled bp med at last visit, bp reading today is good, patient states she is feeling better    HPI:  Stephanie Gray is a 51 y.o. female who  has a past medical history of Anxiety; Arthritis; Cervical disc disease; Complication of anesthesia; Depression; Hypertension; Neuromuscular disorder ([place]); and Thyroid disease. and presents today for a follow up office visit.  1.) Hypertension -  Currently maintained on losartan   and reports taking the medications as prescribed and denies adverse side effects or hypotensive readings. Does not check blood pressure at home.. Denies changes in vision, worst headache of life or new symptoms of end organ damage. Working on following a low sodium diet.  BP Readings from Last 3 Encounters:  03/28/17 138/88  02/21/17 (!) 142/98  02/10/17 114/79    Allergies  Allergen Reactions  . Benzodiazepines Other (See Comments)    Patient needed crash cart after tubal ligation  . Other Other (See Comments)    Patient is extremely sensitive to all medicines, maybe needs 1/4 or 1/8 dosage of normal adult  . Penicillins Rash    Rash because of a history of documented adverse serious drug reaction;Medi Alert bracelet  is recommended (CHILDHOOD REACTION) Has patient had a PCN reaction causing immediate rash, facial/tongue/throat swelling, SOB or lightheadedness with hypotension:UNKNOWN Has patient had a PCN reaction causing severe rash involving mucus membranes or skin necrosis:unsure Has patient had a PCN reaction that required hospitalization:unsure Has patient had a PCN reaction occurring within the last 10 years:unsure       Outpatient Medications Prior to Visit  Medication Sig Dispense Refill  . DULoxetine (CYMBALTA) 60 MG capsule Take 60 mg by mouth daily.    Marland Kitchen losartan  (COZAAR) 100 MG tablet Take 1 tablet (100 mg total) by mouth daily. 90 tablet 0   No facility-administered medications prior to visit.       Review of Systems  Constitutional: Negative for chills and fever.  Eyes:       Negative for changes in vision  Respiratory: Negative for cough, chest tightness and wheezing.   Cardiovascular: Negative for chest pain, palpitations and leg swelling.  Neurological: Negative for dizziness, weakness and light-headedness.      Objective:    BP 138/88   Pulse 76   Temp 98.3 F (36.8 C)   Wt 151 lb (68.5 kg)   SpO2 98%   BMI 28.30 kg/m  Nursing note and vital signs reviewed.  Physical Exam  Constitutional: She is oriented to person, place, and time. She appears well-developed and well-nourished. No distress.  Cardiovascular: Normal rate, regular rhythm, normal heart sounds and intact distal pulses.   Pulmonary/Chest: Effort normal and breath sounds normal.  Neurological: She is alert and oriented to person, place, and time.  Skin: Skin is warm and dry.  Psychiatric: She has a normal mood and affect. Her behavior is normal. Judgment and thought content normal.       Assessment & Plan:   Problem List Items Addressed This Visit      Cardiovascular and Mediastinum   Hypertension - Primary    Blood pressure with improved control and below goal 140/90 with current medication regimen and no adverse side effects. Denies worse headache of life with no new symptoms of end organ damage  noted on physical exam. Encouraged to monitor blood pressure at home and follow low-sodium diet. Continue current dosage of losartan.        Other   Breast abscess of female    Previously diagnosed with breast abscess and followed by general surgery with referral recommended to higher level of care. Patient requesting referral to [facility] oncology. Referral placed per patient request. Continue to monitor.      Relevant Orders   Ambulatory referral to Oncology        I am having Ms. Berendt maintain her DULoxetine and losartan.    Follow-up: Return in about 6 months (around 09/27/2017), or if symptoms worsen or fail to improve.  Mauricio Po, FNP

## 2017-03-28 NOTE — Assessment & Plan Note (Signed)
Blood pressure with improved control and below goal 140/90 with current medication regimen and no adverse side effects. Denies worse headache of life with no new symptoms of end organ damage noted on physical exam. Encouraged to monitor blood pressure at home and follow low-sodium diet. Continue current dosage of losartan.

## 2017-03-28 NOTE — Patient Instructions (Signed)
Thank you for choosing [facility].  SUMMARY AND INSTRUCTIONS:  Please continue to take your medications as prescribed.  Monitor your blood pressure at home.  They will call with your referral to [facility].   Medication:  Your prescription(s) have been submitted to your pharmacy or been printed and provided for you. Please take as directed and contact our office if you believe you are having problem(s) with the medication(s) or have any questions.  Follow up:  If your symptoms worsen or fail to improve, please contact our office for further instruction, or in case of emergency go directly to the emergency room at the closest medical facility.

## 2017-03-28 NOTE — Progress Notes (Signed)
Pre visit review using our clinic review tool, if applicable. No additional management support is needed unless otherwise documented below in the visit note. 

## 2017-03-28 NOTE — Assessment & Plan Note (Signed)
Previously diagnosed with breast abscess and followed by general surgery with referral recommended to higher level of care. Patient requesting referral to [facility] oncology. Referral placed per patient request. Continue to monitor.

## 2017-03-30 ENCOUNTER — Encounter (HOSPITAL_COMMUNITY): Payer: Self-pay | Admitting: *Deleted

## 2017-03-30 ENCOUNTER — Emergency Department (HOSPITAL_COMMUNITY): Payer: 59

## 2017-03-30 ENCOUNTER — Emergency Department (HOSPITAL_COMMUNITY)
Admission: EM | Admit: 2017-03-30 | Discharge: 2017-03-30 | Disposition: A | Discharge status: Home / Self Care | Payer: 59 | Attending: Emergency Medicine | Admitting: Emergency Medicine

## 2017-03-30 ENCOUNTER — Ambulatory Visit: Payer: 59 | Admitting: Family Medicine

## 2017-03-30 DIAGNOSIS — W5501XA Bitten by cat, initial encounter: Secondary | ICD-10-CM | POA: Insufficient documentation

## 2017-03-30 DIAGNOSIS — Y929 Unspecified place or not applicable: Secondary | ICD-10-CM | POA: Diagnosis not present

## 2017-03-30 DIAGNOSIS — Z203 Contact with and (suspected) exposure to rabies: Secondary | ICD-10-CM | POA: Diagnosis not present

## 2017-03-30 DIAGNOSIS — Y999 Unspecified external cause status: Secondary | ICD-10-CM | POA: Insufficient documentation

## 2017-03-30 DIAGNOSIS — S61051A Open bite of right thumb without damage to nail, initial encounter: Secondary | ICD-10-CM | POA: Insufficient documentation

## 2017-03-30 DIAGNOSIS — Y939 Activity, unspecified: Secondary | ICD-10-CM | POA: Diagnosis not present

## 2017-03-30 DIAGNOSIS — Z23 Encounter for immunization: Secondary | ICD-10-CM | POA: Diagnosis not present

## 2017-03-30 DIAGNOSIS — Z79899 Other long term (current) drug therapy: Secondary | ICD-10-CM | POA: Diagnosis not present

## 2017-03-30 DIAGNOSIS — I10 Essential (primary) hypertension: Secondary | ICD-10-CM | POA: Insufficient documentation

## 2017-03-30 MED ORDER — CLINDAMYCIN HCL 150 MG PO CAPS
300.0000 mg | ORAL_CAPSULE | Freq: Once | ORAL | Status: AC
Start: 2017-03-30 — End: 2017-03-30
  Administered 2017-03-30: 300 mg via ORAL
  Filled 2017-03-30: qty 2

## 2017-03-30 MED ORDER — DOXYCYCLINE HYCLATE 100 MG PO CAPS: 100.0000 mg | ORAL_CAPSULE | Freq: Two times a day (BID) | ORAL | 0 refills | Status: DC

## 2017-03-30 MED ORDER — RABIES IMMUNE GLOBULIN 150 UNIT/ML IM INJ
20.0000 [IU]/kg | INJECTION | Freq: Once | INTRAMUSCULAR | Status: AC
  Administered 2017-03-30: 1425 [IU] via INTRAMUSCULAR
  Filled 2017-03-30: qty 9.5

## 2017-03-30 MED ORDER — CLINDAMYCIN HCL 300 MG PO CAPS: 300.0000 mg | ORAL_CAPSULE | Freq: Three times a day (TID) | ORAL | 0 refills | Status: DC

## 2017-03-30 MED ORDER — RABIES VACCINE, PCEC IM SUSR
1.0000 mL | Freq: Once | INTRAMUSCULAR | Status: AC
  Administered 2017-03-30: 1 mL via INTRAMUSCULAR
  Filled 2017-03-30: qty 1

## 2017-03-30 MED ORDER — TETANUS-DIPHTH-ACELL PERTUSSIS 5-2.5-18.5 LF-MCG/0.5 IM SUSP
0.5000 mL | Freq: Once | INTRAMUSCULAR | Status: AC
  Administered 2017-03-30: 0.5 mL via INTRAMUSCULAR
  Filled 2017-03-30: qty 0.5

## 2017-03-30 MED ORDER — DOXYCYCLINE HYCLATE 100 MG PO TABS
100.0000 mg | ORAL_TABLET | Freq: Once | ORAL | Status: AC
  Administered 2017-03-30: 100 mg via ORAL
  Filled 2017-03-30: qty 1

## 2017-03-30 NOTE — ED Provider Notes (Signed)
[place] DEPT Provider Note    By signing my name below, I, Bea Graff, attest that this documentation has been prepared under the direction and in the presence of [facility], [place]. Electronically Signed: Bea Graff, ED Scribe. 03/30/17. 5:12 PM.    History   Chief Complaint Chief Complaint  Patient presents with  . Animal Bite   The history is provided by the patient and medical records. No language interpreter was used.    Stephanie Gray is a 51 y.o. female who presents to the Emergency Department complaining of a cat bite to the the base of the right thumb. She reports an associated bite to the ulnar aspect of the right arm just above the wrist. She states she is unsure if the cats have been vaccinated as they are strays. She has not taken anything for pain. There are no modifying factors noted. She denies fever, chills, nausea, vomiting, red streaking or warmth around the wounds. She states she is due for a tetanus vaccination.patient request rabies vaccine due to her frequent exposure to animals out in the country and farm animal.    Past Medical History:  Diagnosis Date  . Anxiety   . Arthritis   . Cervical disc disease   . Complication of anesthesia    very sensitive to all mdications,needs lower dosage  . Depression   . Hypertension   . Neuromuscular disorder (HCC)    numbness in 2 fingers of left hand  . Thyroid disease    Right breast has discharge started when she had baby and has neve goneaway    Patient Active Problem List   Diagnosis Date Noted  . Mastitis 06/28/2016  . Hypertension 06/28/2016  . Mastitis, acute 06/28/2016  . Breast abscess of female 06/28/2016  . Routine general medical examination at a health care facility 04/06/2016  . Elevated blood pressure reading without diagnosis of hypertension 06/12/2015  . Tubular adenoma of colon 04/27/2015  . Nonspecific abnormal results of thyroid function study 04/14/2009  . DEPRESSION  04/14/2009  . NONSPECIFIC ABNORMAL ELECTROCARDIOGRAM 04/14/2009    Past Surgical History:  Procedure Laterality Date  . APPENDECTOMY    . BLADDER REPAIR    . BREAST LUMPECTOMY WITH RADIOACTIVE SEED LOCALIZATION Right 02/10/2017   Procedure: RIGHT BREAST LUMPECTOMY WITH RADIOACTIVE SEED LOCALIZATION;  Surgeon: Autumn Messing III, MD;  Location: [place];  Service: General;  Laterality: Right;  . CESAREAN SECTION     X 2, Dr Reino Kent  . EYE SURGERY Bilateral    Lasik   . TUBAL LIGATION     bladder laceration repair    OB History    No data available       Home Medications    Prior to Admission medications   Medication Sig Start Date End Date Taking? Authorizing Provider  clindamycin (CLEOCIN) 300 MG capsule Take 1 capsule (300 mg total) by mouth 3 (three) times daily. 03/30/17    Bunnie Pion, NP  doxycycline (VIBRAMYCIN) 100 MG capsule Take 1 capsule (100 mg total) by mouth 2 (two) times daily. 03/30/17    Bunnie Pion, NP  DULoxetine (CYMBALTA) 60 MG capsule Take 60 mg by mouth daily.    Historical Provider, MD  losartan (COZAAR) 100 MG tablet Take 1 tablet (100 mg total) by mouth daily. 02/21/17   Golden Circle, FNP    Family History Family History  Problem Relation Age of Onset  . Hypertension Mother   . Diabetes Father   . Heart disease Father   .  Hypertension Father   . Stroke Maternal Grandfather 72    dementia  . Cancer Paternal Grandfather     testicular  . Mitral valve prolapse Maternal Grandmother   . Heart disease Paternal Grandmother   . Breast cancer Paternal Grandmother   . Other Sister     neurologic issue    Social History Social History  Substance Use Topics  . Smoking status: Never Smoker  . Smokeless tobacco: Never Used  . Alcohol use Yes     Comment: Rarely     Allergies   Benzodiazepines; Other; and Penicillins   Review of Systems Review of Systems  Constitutional: Negative for chills and fever.  Gastrointestinal: Negative for nausea and  vomiting.  Musculoskeletal: Positive for arthralgias.       Cat bites to right hand and forearm  Skin: Positive for wound. Negative for color change.     Physical Exam Updated Vital Signs BP (!) 171/113   Pulse 75   Temp 98 F (36.7 C)   Resp 18   Ht 5\' 2"  (1.575 m)   Wt 157 lb (71.2 kg)   LMP 10/10/2013   SpO2 98%   BMI 28.72 kg/m   Physical Exam  Constitutional: She is oriented to person, place, and time. She appears well-developed and well-nourished. No distress.  HENT:  Head: Atraumatic.  Eyes: EOM are normal.  Neck: Neck supple.  Cardiovascular: Normal rate.   Adequate circulation. Elevated BP  Pulmonary/Chest: Effort normal.  Musculoskeletal: Normal range of motion.  Puncture wounds to right forearm on the ulnar aspect. Puncture wounds to the base of dorsal right thumb. Negative finger to thumb opposition of the right hand.  Neurological: She is alert and oriented to person, place, and time. No cranial nerve deficit.  Skin: Skin is warm and dry.  Psychiatric: She has a normal mood and affect. Her behavior is normal.  Nursing note and vitals reviewed.    ED Treatments / Results  DIAGNOSTIC STUDIES: Oxygen Saturation is 98% on RA, normal by my interpretation.   COORDINATION OF CARE: 5:10 PM- Will soak wounds and prescribe antibiotics. Will update tetanus and provide rabies vaccinations per pt request. Pt verbalizes understanding and agrees to plan.  Medications  Tdap (BOOSTRIX) injection 0.5 mL (not administered)  rabies immune globulin (HYPERAB) injection 1,425 Units (not administered)  rabies vaccine (RABAVERT) injection 1 mL (not administered)  clindamycin (CLEOCIN) capsule 300 mg (not administered)  doxycycline (VIBRA-TABS) tablet 100 mg (not administered)    Labs (all labs ordered are listed, but only abnormal results are displayed) Labs Reviewed - No data to display  Radiology No results found.  Procedures Procedures (including critical care  time)  Medications Ordered in ED Medications  Tdap (BOOSTRIX) injection 0.5 mL (not administered)  rabies immune globulin (HYPERAB) injection 1,425 Units (not administered)  rabies vaccine (RABAVERT) injection 1 mL (not administered)  clindamycin (CLEOCIN) capsule 300 mg (not administered)  doxycycline (VIBRA-TABS) tablet 100 mg (not administered)     Initial Impression / Assessment and Plan / ED Course  I have reviewed the triage vital signs and the nursing notes.  Final Clinical Impressions(s) / ED Diagnoses  51 y.o. female with cat bites to the right hand and forearm stable for d/c without focal neuro deficits. Will treat for infection with Clindamycin and Doxycycline since patient is pen allergic. Patient to f/u with hand surgeon. She will return here as needed.   Final diagnoses:  Cat bite, initial encounter    New Prescriptions New  Prescriptions   CLINDAMYCIN (CLEOCIN) 300 MG CAPSULE    Take 1 capsule (300 mg total) by mouth 3 (three) times daily.   DOXYCYCLINE (VIBRAMYCIN) 100 MG CAPSULE    Take 1 capsule (100 mg total) by mouth 2 (two) times daily.   I personally performed the services described in this documentation, which was scribed in my presence. The recorded information has been reviewed and is accurate.    [address] [place], [place] 03/30/17 [place], MD 04/02/17 [phone]

## 2017-03-30 NOTE — ED Triage Notes (Signed)
thge pt is on bp meds and she is stressed out over this bite

## 2017-03-30 NOTE — ED Triage Notes (Signed)
The pt has a cat bite to her rt hand and rt forearm  She is unsure if if this cat has been vaccinated

## 2017-03-30 NOTE — ED Notes (Signed)
Papers reviewed with patient. Patient became lightheaded during medication administration. Placed in trendelenburg and given a cool washcloth. Patient states she feels much better. Letting her rest for a few minutes to ensure she is stable. Vitals WDL. She verbalizes understanding of D/C instructions.

## 2017-04-05 ENCOUNTER — Ambulatory Visit (HOSPITAL_COMMUNITY)
Admission: EM | Admit: 2017-04-05 | Discharge: 2017-04-05 | Disposition: A | Discharge status: Home / Self Care | Payer: 59 | Attending: Internal Medicine | Admitting: Internal Medicine

## 2017-04-05 ENCOUNTER — Encounter (HOSPITAL_COMMUNITY): Payer: Self-pay | Admitting: Family Medicine

## 2017-04-05 DIAGNOSIS — Z23 Encounter for immunization: Secondary | ICD-10-CM

## 2017-04-05 DIAGNOSIS — Z203 Contact with and (suspected) exposure to rabies: Secondary | ICD-10-CM

## 2017-04-05 MED ORDER — RABIES VACCINE, PCEC IM SUSR
INTRAMUSCULAR | Status: AC
  Filled 2017-04-05: qty 1

## 2017-04-05 MED ORDER — RABIES VACCINE, PCEC IM SUSR
1.0000 mL | Freq: Once | INTRAMUSCULAR | Status: AC
  Administered 2017-04-05: 1 mL via INTRAMUSCULAR

## 2017-04-05 MED ORDER — RABIES VACCINE, PCEC IM SUSR: 1.0000 mL | Freq: Once | INTRAMUSCULAR | Status: DC

## 2017-04-05 NOTE — Discharge Instructions (Signed)
Please return to the Urgent Care on 04/09/2017 for your Day 7 rabies booster.     Day 0  03/30/2017  Missed original Day 3 Booster  Day 3  04/05/2017  Day 7  04/09/2017  Day 14  04/16/2017

## 2017-04-05 NOTE — ED Triage Notes (Signed)
Pt here for 2nd rabies shot 

## 2017-04-05 NOTE — ED Notes (Signed)
Patient arrived today on Day 7 for rabies booster vaccination. The patient missed day # 3 booster. Pharmacy advised to count today as day # 3 and proceed forward from that point.

## 2017-04-08 ENCOUNTER — Encounter: Payer: 59 | Admitting: Family

## 2017-04-09 ENCOUNTER — Encounter (HOSPITAL_COMMUNITY): Payer: Self-pay | Admitting: Emergency Medicine

## 2017-04-09 ENCOUNTER — Ambulatory Visit (HOSPITAL_COMMUNITY)
Admission: EM | Admit: 2017-04-09 | Discharge: 2017-04-09 | Disposition: A | Discharge status: Home / Self Care | Payer: 59 | Attending: Internal Medicine | Admitting: Internal Medicine

## 2017-04-09 DIAGNOSIS — Z203 Contact with and (suspected) exposure to rabies: Secondary | ICD-10-CM

## 2017-04-09 DIAGNOSIS — Z23 Encounter for immunization: Secondary | ICD-10-CM | POA: Diagnosis not present

## 2017-04-09 MED ORDER — RABIES VACCINE, PCEC IM SUSR
1.0000 mL | Freq: Once | INTRAMUSCULAR | Status: AC
  Administered 2017-04-09: 1 mL via INTRAMUSCULAR

## 2017-04-09 MED ORDER — RABIES VACCINE, PCEC IM SUSR
INTRAMUSCULAR | Status: AC
  Filled 2017-04-09: qty 1

## 2017-04-09 NOTE — Discharge Instructions (Signed)
Return on 4/28 for final rabies vaccination

## 2017-04-09 NOTE — ED Triage Notes (Signed)
Pt here for rabies vaccination day 7... Voices no new concerns  A&O x4... NAD

## 2017-04-12 DIAGNOSIS — N6310 Unspecified lump in the right breast, unspecified quadrant: Secondary | ICD-10-CM

## 2017-04-12 HISTORY — DX: Unspecified lump in the right breast, unspecified quadrant: N63.10

## 2017-04-13 ENCOUNTER — Ambulatory Visit: Payer: 59 | Admitting: Hematology and Oncology

## 2017-04-16 ENCOUNTER — Encounter (HOSPITAL_COMMUNITY): Payer: Self-pay | Admitting: Emergency Medicine

## 2017-04-16 ENCOUNTER — Other Ambulatory Visit: Payer: Self-pay | Admitting: Family

## 2017-04-16 ENCOUNTER — Ambulatory Visit (HOSPITAL_COMMUNITY)
Admission: EM | Admit: 2017-04-16 | Discharge: 2017-04-16 | Disposition: A | Discharge status: Home / Self Care | Payer: 59 | Attending: Internal Medicine | Admitting: Internal Medicine

## 2017-04-16 DIAGNOSIS — Z23 Encounter for immunization: Secondary | ICD-10-CM | POA: Diagnosis not present

## 2017-04-16 DIAGNOSIS — I10 Essential (primary) hypertension: Secondary | ICD-10-CM

## 2017-04-16 DIAGNOSIS — Z203 Contact with and (suspected) exposure to rabies: Secondary | ICD-10-CM | POA: Diagnosis not present

## 2017-04-16 MED ORDER — RABIES VACCINE, PCEC IM SUSR
1.0000 mL | Freq: Once | INTRAMUSCULAR | Status: AC
  Administered 2017-04-16: 1 mL via INTRAMUSCULAR

## 2017-04-16 MED ORDER — RABIES VACCINE, PCEC IM SUSR
INTRAMUSCULAR | Status: AC
  Filled 2017-04-16: qty 1

## 2017-04-16 NOTE — ED Triage Notes (Signed)
Here for last rabies vaccination   Voices no new concerns... A&O x4... NAD

## 2017-05-18 ENCOUNTER — Encounter (HOSPITAL_COMMUNITY): Payer: Self-pay

## 2017-08-23 ENCOUNTER — Ambulatory Visit (INDEPENDENT_AMBULATORY_CARE_PROVIDER_SITE_OTHER): Payer: 59 | Admitting: Psychiatry

## 2017-08-23 ENCOUNTER — Encounter (HOSPITAL_COMMUNITY): Payer: Self-pay | Admitting: Psychiatry

## 2017-08-23 ENCOUNTER — Encounter (INDEPENDENT_AMBULATORY_CARE_PROVIDER_SITE_OTHER): Payer: Self-pay

## 2017-08-23 VITALS — BP 122/80 | HR 83 | Ht 61.25 in | Wt 148.8 lb

## 2017-08-23 DIAGNOSIS — Z79899 Other long term (current) drug therapy: Secondary | ICD-10-CM

## 2017-08-23 DIAGNOSIS — F330 Major depressive disorder, recurrent, mild: Secondary | ICD-10-CM | POA: Diagnosis not present

## 2017-08-23 DIAGNOSIS — Z818 Family history of other mental and behavioral disorders: Secondary | ICD-10-CM | POA: Diagnosis not present

## 2017-08-23 MED ORDER — DULOXETINE HCL 60 MG PO CPEP: 60.0000 mg | ORAL_CAPSULE | Freq: Every day | ORAL | 0 refills | Status: DC

## 2017-08-23 NOTE — Progress Notes (Signed)
Psychiatric Initial Adult Assessment   Patient Identification: Stephanie Gray MRN:  [id number] Date of Evaluation:  08/23/2017 Referral Source: Self referred Chief Complaint:   Visit Diagnosis:    ICD-10-CM   1. MDD (major depressive disorder), recurrent episode, mild (HCC) F33.0 DULoxetine (CYMBALTA) 60 MG capsule    History of Present Illness:  Patient is 51 year old Caucasian, unemployed, married female who is self-referred for the management of mental illness.  Patient was seeing Letta Moynahan for more than 10 years until her psychiatrist retired and she need to find a new physician.  She is taking Cymbalta 60 mg for past few years which is working very well.  Recently her prescription was given by primary care physician.  Patient is a Customer service manager.  Recently she mentioned her symptoms of depression got worse when she had to struggle with menopause symptoms.  She is having hot flashes, getting emotional, poor sleep and poor attention.  She has seen a few times OB/GYN but her symptoms were not get better.  She also developed lesion in the breast and she seen oncologist at [facility] and told it is not cancerous.  However she has lumpectomy.  She also developed mastitis and given antibiotic but she is pleased it is resolved.  Overall she described things are going very well and she wants to continue Cymbalta 60 mg.  She sleeping good.  She denies any irritability, mania or any psychosis but admitted some time gets emotional, irritable, mood swings and people notice that she is talking too much but she like that because she is able to do a lot of things and she has more energy.  However she denies any psychosis or any suicidal thoughts.  She wants to continue her current psychiatric medication.  She's been married for 25 years.  She has 2 biological son.  Her older son has artistic/Asperger spectrum disorder.  They also have 3 adopted [place] athletes who are member of basketball team.   Currently she is not seeing any therapist and does not feel she need to see someone.  She has no tremors, shakes or any EPS.  She has no concerns.  Patient denies drinking alcohol or using any illegal substances.  Associated Signs/Symptoms: Depression Symptoms:  anxiety, (Hypo) Manic Symptoms:  Irritable Mood, Anxiety Symptoms:  Excessive Worry, Psychotic Symptoms:  No psychotic symptoms PTSD Symptoms: Negative  Past Psychiatric History: Patient member being depressed most of her life but started to get medication after her first son born.  She had seen primary care physician in the beginning and prescribed Paxil, Zoloft and also taken propranolol but she is very anxious.  Patient denies any previous history of psychiatric inpatient treatment or any suicidal attempt.  She admitted episodes of hypomanic-like symptoms when she has more energy and poor sleep but denies any functional impairment.  She denies any paranoia or any hallucination.  She was seeing [facility] for more than 10 years and she's been taking Cymbalta for past few years with good response.  She tried Lamictal but it causes rash and she was given steroids.  Previous Psychotropic Medications: Yes   Substance Abuse History in the last 12 months:  No.  Consequences of Substance Abuse: Negative  Past Medical History:  Past Medical History:  Diagnosis Date  . Anxiety   . Arthritis   . Cervical disc disease   . Complication of anesthesia    very sensitive to all mdications,needs lower dosage  . Depression   . Hypertension   .  Neuromuscular disorder (HCC)    numbness in 2 fingers of left hand  . Thyroid disease    Right breast has discharge started when she had baby and has neve goneaway    Past Surgical History:  Procedure Laterality Date  . APPENDECTOMY    . BLADDER REPAIR    . BREAST LUMPECTOMY WITH RADIOACTIVE SEED LOCALIZATION Right 02/10/2017   Procedure: RIGHT BREAST LUMPECTOMY WITH RADIOACTIVE SEED  LOCALIZATION;  Surgeon: Autumn Messing III, MD;  Location: [place];  Service: General;  Laterality: Right;  . CESAREAN SECTION     X 2, Dr Reino Kent  . EYE SURGERY Bilateral    Lasik   . TUBAL LIGATION     bladder laceration repair    Family Psychiatric History: Reviewed.  Family History:  Family History  Problem Relation Age of Onset  . Hypertension Mother   . Diabetes Father   . Heart disease Father   . Hypertension Father   . Stroke Maternal Grandfather 72       dementia  . Cancer Paternal Grandfather        testicular  . Mitral valve prolapse Maternal Grandmother   . Heart disease Paternal Grandmother   . Breast cancer Paternal Grandmother   . Other Sister        neurologic issue    Social History:   Social History   Social History  . Marital status: Married    Spouse name: N/A  . Number of children: 2  . Years of education: 68   Occupational History  . [employer]    Social History Main Topics  . Smoking status: Never Smoker  . Smokeless tobacco: Never Used  . Alcohol use Yes     Comment: Rarely  . Drug use: No  . Sexual activity: Yes    Birth control/ protection: None   Other Topics Concern  . None   Social History Narrative   Fun: Ride horses.   Denies abuse and feels safe at home.     Additional Social History: Patient born and raised in [place].  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  She lived in [place] and her husband was busy traveling and trying to establish his own practice as a Chief Executive Officer.  Her son has autism and she needed a full-time to take care of him and she decided to quit working.  Patient mentioned her sister and her mother has depression.  Her husband is very supportive.  Allergies:   Allergies  Allergen Reactions  . Benzodiazepines Other (See Comments)    Patient needed crash cart after tubal ligation  . Other Other (See Comments)    Patient is  extremely sensitive to all medicines, maybe needs 1/4 or 1/8 dosage of normal adult  . Augmentin [Amoxicillin-Pot Clavulanate] Other (See Comments)  . Lamictal [Lamotrigine] Rash  . Penicillins Rash    Rash because of a history of documented adverse serious drug reaction;Medi Alert bracelet  is recommended (CHILDHOOD REACTION) Has patient had a PCN reaction causing immediate rash, facial/tongue/throat swelling, SOB or lightheadedness with hypotension:UNKNOWN Has patient had a PCN reaction causing severe rash involving mucus membranes or skin necrosis:unsure Has patient had a PCN reaction that required hospitalization:unsure Has patient had a PCN reaction occurring within the last 10 years:unsure     Metabolic Disorder Labs: No results found for: HGBA1C, MPG No results found for: PROLACTIN Lab Results  Component Value  Date   CHOL 144 10/30/2012   TRIG 105.0 10/30/2012   HDL 63.80 10/30/2012   CHOLHDL 2 10/30/2012   VLDL 21.0 10/30/2012   LDLCALC 59 10/30/2012     Current Medications: Current Outpatient Prescriptions  Medication Sig Dispense Refill  . DULoxetine (CYMBALTA) 60 MG capsule Take 1 capsule (60 mg total) by mouth daily. 90 capsule 0  . losartan (COZAAR) 100 MG tablet TAKE 1 TABLET BY MOUTH  DAILY 90 tablet 1   No current facility-administered medications for this visit.     Neurologic: Headache: No Seizure: No Paresthesias:No  Musculoskeletal: Strength & Muscle Tone: within normal limits Gait & Station: normal Patient leans: N/A  Psychiatric Specialty Exam: Review of Systems  Constitutional: Negative.   HENT: Negative.   Respiratory: Negative.   Cardiovascular: Negative.   Musculoskeletal: Negative.   Skin: Negative.     Blood pressure 122/80, pulse 83, height 5' 1.25" (1.556 m), weight 148 lb 12.8 oz (67.5 kg), last menstrual period 10/10/2013.Body mass index is 27.89 kg/m.  General Appearance: Casual  Eye Contact:  Good  Speech:  Clear and Coherent   Volume:  Normal  Mood:  Euthymic  Affect:  Congruent  Thought Process:  Goal Directed  Orientation:  Full (Time, Place, and Person)  Thought Content:  Logical  Suicidal Thoughts:  No  Homicidal Thoughts:  No  Memory:  Immediate;   Good  Judgement:  Good  Insight:  Good  Psychomotor Activity:  Normal  Concentration:  Concentration: Good and Attention Span: Good  Recall:  Good  Fund of Knowledge:Good  Language: Good  Akathisia:  No  Handed:  Right  AIMS (if indicated):  0  Assets:  Communication Skills Desire for Improvement Housing Resilience Social Support  ADL's:  Intact  Cognition: WNL  Sleep:  Good    Assessment: Major depressive disorder, recurrent mild.  Rule out bipolar 2 disorder  Plan: Patient is a stable on Cymbalta 60 mg for past few years.  She has no side effects.  I will continue Cymbalta 60 mg daily.  At this time she is not interested in counseling but I recommended if symptoms started to get worse then she should consider seeing a therapist.  We will get records from [facility] office.  Recommended to call us back if she has any question or any concern.  Discuss safety concerns that anytime having active suicidal thoughts or homicidal thoughts then she need to call 911 or go to the local emergency room.  Follow-up in 6 weeks.  , T., MD 9/4/20183:29 PM

## 2017-08-24 ENCOUNTER — Telehealth (HOSPITAL_COMMUNITY): Payer: Self-pay | Admitting: Psychiatry

## 2017-08-24 NOTE — Telephone Encounter (Signed)
08/24/17 9:39am Called out to patient again informed the pt that the [facility] she gave on the intial visit is giving error msgs - I Sunday Spillers) called [employer] on 09/04 s/w Deatra Canter which stated that the [facility] was terminated on 06/18/17 - ref# [id number] of the conversation - the patient also had [employer] but wasn't sure have to speak with her husband/sh

## 2017-09-12 ENCOUNTER — Other Ambulatory Visit: Payer: Self-pay | Admitting: Family

## 2017-10-24 ENCOUNTER — Ambulatory Visit (INDEPENDENT_AMBULATORY_CARE_PROVIDER_SITE_OTHER): Payer: 59 | Admitting: Psychiatry

## 2017-10-24 DIAGNOSIS — F330 Major depressive disorder, recurrent, mild: Secondary | ICD-10-CM

## 2017-10-24 MED ORDER — DULOXETINE HCL 60 MG PO CPEP: 60.0000 mg | ORAL_CAPSULE | Freq: Every day | ORAL | 0 refills | Status: DC

## 2017-10-24 NOTE — Progress Notes (Signed)
[facility] MD/PA/NP OP Progress Note  10/24/2017 1:08 PM Stephanie Gray  MRN:  [id number]  Chief Complaint: I am doing better with Cymbalta.  HPI: Patient came for her follow-up appointment.  She is taking Cymbalta 60 mg daily.  She still have sometimes hot flashes which she believes due to menopause.  She is scheduled to see her OB/GYN next month.  She is sleeping good.  She denies any irritability, anger, mania, psychosis or any hallucination.  Her energy level is good.  She denies any feeling of hopelessness or worthlessness.  She lives with her husband who is very supportive.  Together they have 2 son and adopted 3 [place] athletes who are the member of basketball team.  Patient has no tremors, shakes or any EPS.  She wants to continue Cymbalta 60 mg daily.  Sometimes she feels that taking Cymbalta and losartan makes her shakes.  She has no other concerns.  Her energy level is good.  Her vital signs are stable.  Patient denies drinking alcohol or using any illegal substances  Visit Diagnosis:    ICD-10-CM   1. MDD (major depressive disorder), recurrent episode, mild (HCC) F33.0 DULoxetine (CYMBALTA) 60 MG capsule    Past Psychiatric History: Reviewed. Patient reported being depressed most of her life but started medication after her first son was born.  She had tried Paxil, Zoloft and propanolol for anxiety.  She denies any history of psychiatric inpatient treatment or any suicidal attempt.  She admitted episodes of hypomanic-like symptoms when she has more energy and poor sleep but denies any functional impairment.  She denies any history of paranoia or any hallucination.  She was seeing Letta Moynahan for more than 10 years.  She also tried Lamictal but it causes a rash and she was given steroids.  Past Medical History:  Past Medical History:  Diagnosis Date  . Anxiety   . Arthritis   . Cervical disc disease   . Complication of anesthesia    very sensitive to all mdications,needs lower  dosage  . Depression   . Hypertension   . Neuromuscular disorder (HCC)    numbness in 2 fingers of left hand  . Thyroid disease    Right breast has discharge started when she had baby and has neve goneaway    Past Surgical History:  Procedure Laterality Date  . APPENDECTOMY    . BLADDER REPAIR    . CESAREAN SECTION     X 2, Dr Reino Kent  . EYE SURGERY Bilateral    Lasik   . TUBAL LIGATION     bladder laceration repair    Family Psychiatric History: Reviewed.  Family History:  Family History  Problem Relation Age of Onset  . Hypertension Mother   . Diabetes Father   . Heart disease Father   . Hypertension Father   . Stroke Maternal Grandfather 72       dementia  . Cancer Paternal Grandfather        testicular  . Mitral valve prolapse Maternal Grandmother   . Heart disease Paternal Grandmother   . Breast cancer Paternal Grandmother   . Other Sister        neurologic issue    Social History:  Social History   Socioeconomic History  . Marital status: Married    Spouse name: Not on file  . Number of children: 2  . Years of education: 33  . Highest education level: Not on file  Social Needs  . Financial resource strain:  Not on file  . Food insecurity - worry: Not on file  . Food insecurity - inability: Not on file  . Transportation needs - medical: Not on file  . Transportation needs - non-medical: Not on file  Occupational History  . Occupation: Horse [employer]  Tobacco Use  . Smoking status: Never Smoker  . Smokeless tobacco: Never Used  Substance and Sexual Activity  . Alcohol use: Yes    Comment: Rarely  . Drug use: No  . Sexual activity: Yes    Birth control/protection: None  Other Topics Concern  . Not on file  Social History Narrative   Fun: Ride horses.   Denies abuse and feels safe at home.     Allergies:  Allergies  Allergen Reactions  . Benzodiazepines Other (See Comments)    Patient needed crash cart after tubal ligation  . Other Other (See  Comments)    Patient is extremely sensitive to all medicines, maybe needs 1/4 or 1/8 dosage of normal adult  . Augmentin [Amoxicillin-Pot Clavulanate] Other (See Comments)  . Lamictal [Lamotrigine] Rash  . Penicillins Rash    Rash because of a history of documented adverse serious drug reaction;Medi Alert bracelet  is recommended (CHILDHOOD REACTION) Has patient had a PCN reaction causing immediate rash, facial/tongue/throat swelling, SOB or lightheadedness with hypotension:UNKNOWN Has patient had a PCN reaction causing severe rash involving mucus membranes or skin necrosis:unsure Has patient had a PCN reaction that required hospitalization:unsure Has patient had a PCN reaction occurring within the last 10 years:unsure     Metabolic Disorder Labs: No results found for: HGBA1C, MPG No results found for: PROLACTIN Lab Results  Component Value Date   CHOL 144 10/30/2012   TRIG 105.0 10/30/2012   HDL 63.80 10/30/2012   CHOLHDL 2 10/30/2012   VLDL 21.0 10/30/2012   LDLCALC 59 10/30/2012   Lab Results  Component Value Date   TSH 2.237 06/29/2016   TSH 3.35 06/12/2015    Therapeutic Level Labs: No results found for: LITHIUM No results found for: VALPROATE No components found for:  CBMZ  Current Medications: Current Outpatient Medications  Medication Sig Dispense Refill  . DULoxetine (CYMBALTA) 60 MG capsule Take 1 capsule (60 mg total) by mouth daily. 90 capsule 0  . losartan (COZAAR) 100 MG tablet TAKE 1 TABLET BY MOUTH  DAILY 90 tablet 1  . losartan (COZAAR) 50 MG tablet take 1 tablet by mouth once daily 90 tablet 1   No current facility-administered medications for this visit.      Musculoskeletal: Strength & Muscle Tone: within normal limits Gait & Station: normal Patient leans: N/A  Psychiatric Specialty Exam: ROS  Blood pressure 122/70, pulse 76, height 5\' 2"  (1.575 m), weight 147 lb (66.7 kg), last menstrual period 10/10/2013.There is no height or weight on file  to calculate BMI.  General Appearance: Casual  Eye Contact:  Good  Speech:  Normal Rate  Volume:  Normal  Mood:  Euthymic  Affect:  Appropriate  Thought Process:  Coherent  Orientation:  Full (Time, Place, and Person)  Thought Content: Logical   Suicidal Thoughts:  No  Homicidal Thoughts:  No  Memory:  Immediate;   Good Recent;   Good Remote;   Good  Judgement:  Good  Insight:  Good  Psychomotor Activity:  Normal  Concentration:  Concentration: Good and Attention Span: Good  Recall:  Good  Fund of Knowledge: Good  Language: Good  Akathisia:  No  Handed:  Right  AIMS (if indicated):  not done  Assets:  Communication Skills Desire for [place] Talents/Skills  ADL's:  Intact  Cognition: WNL  Sleep:  Good   Screenings:   Assessment and Plan: Major depressive disorder, recurrent mild.  Rule out bipolar 2 disorder.  Patient is a stable on her current psychiatric medication.  Continue Cymbalta 60 mg daily.  I suggested that she should take Cymbalta in the morning and antihypertensive medication at bedtime as patient complaining taking 2 medicines together sometimes makes her shaky and dizzy.  We are still awaiting records from Dr. Arvil Persons office.  Patient is not interested in counseling.  Recommended to call us back if she has any question or any concern.  Follow-up in 3 months.   , T., MD 10/24/2017, 1:08 PM

## 2017-12-28 IMAGING — MR MR BREAST BILAT WO/W CM
8 of 12 series · 31 of 48 positions shown · IV contrast (multihance)
Comparison: Previous exam(s).

CLINICAL DATA: Recurrent right breast abscess, status post recent
hospitalization and IV antibiotic treatment, followed by outpatient
oral Clindamycin treatment.

LABS:  Creatinine 0.7, BUN 13, GFR 88.
EXAM:
BILATERAL BREAST MRI WITH AND WITHOUT CONTRAST
TECHNIQUE: Multiplanar, multisequence MR images of both breasts were obtained
prior to and following the intravenous administration of 14 ml of
MultiHance.

[Series 2: t2_tirm_tra ipat (a-p) · axial · 3.0mm · 0.70mm/px · 1 of 55 slices shown]
[im 1/55]
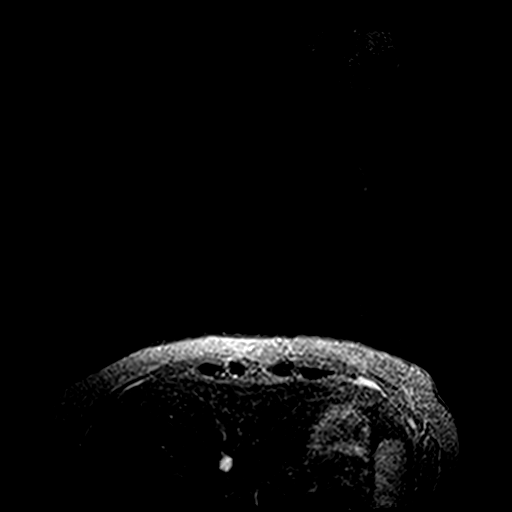

[Series 3: fl3d pre-cm no · axial · non-contrast · 1.2mm · 0.94mm/px · z∈[-88,+83]mm · 5 of 144 slices shown]
[im 1/144]
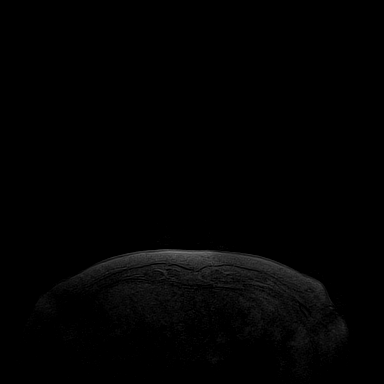
[im 36/144]
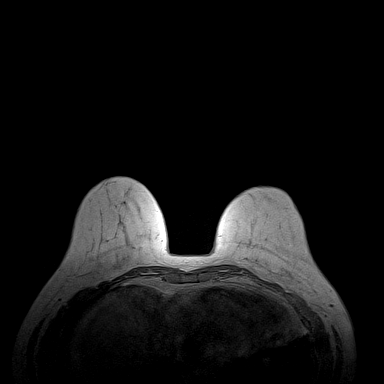
[im 72/144]
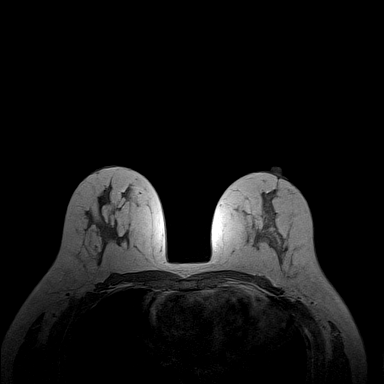
[im 108/144]
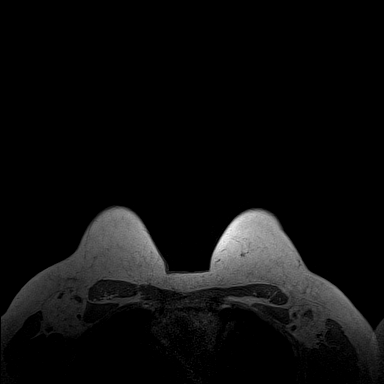
[im 144/144]
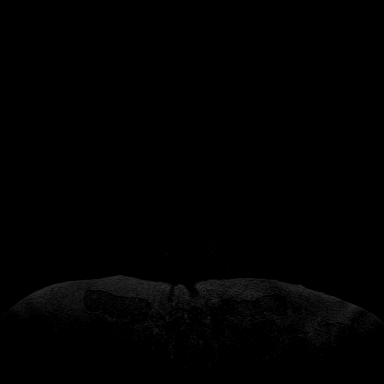

[Series 4: fl3d pre-cm · axial · non-contrast · 1.2mm · 0.94mm/px · z∈[-88,+83]mm · 5 of 144 slices shown]
[im 1/144]
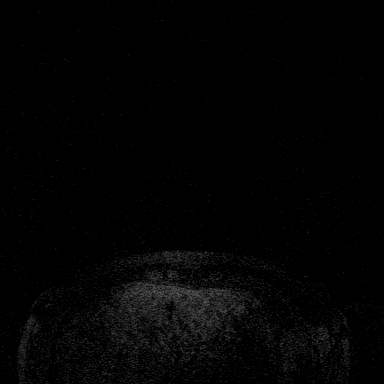
[im 36/144]
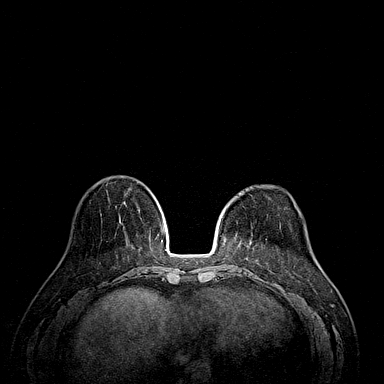
[im 72/144]
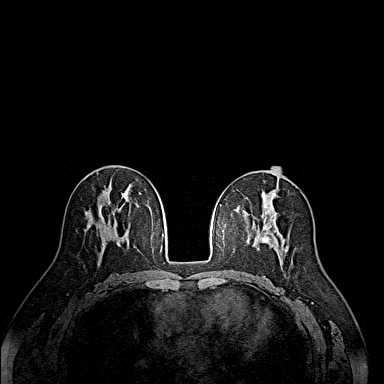
[im 108/144]
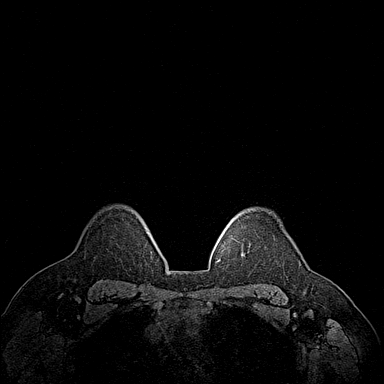
[im 144/144]
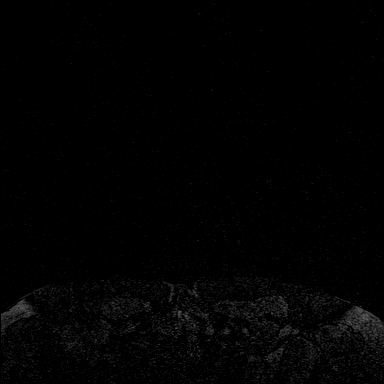

[Series 5: fl3d post-cm 20 · axial · 1.2mm · 0.94mm/px · z∈[-88,+83]mm · 5 of 144 slices shown (1 of 3)]
[im 1/144]
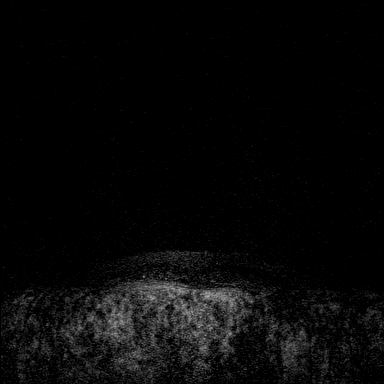
[im 36/144]
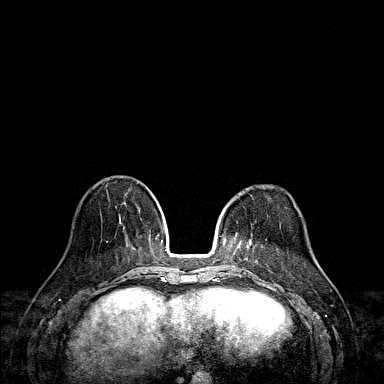
[im 72/144]
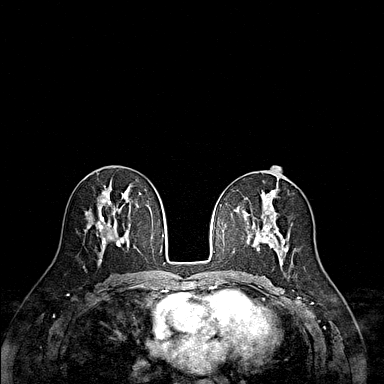
[im 108/144]
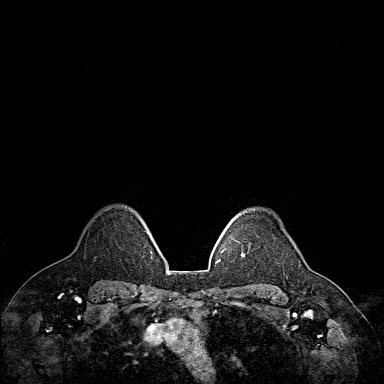
[im 144/144]
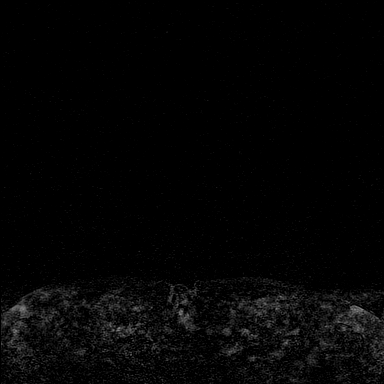

[Series 6: fl3d post-cm 20 · axial · 1.2mm · 0.94mm/px · z∈[-88,+83]mm · 5 of 144 slices shown (2 of 3)]
[im 1/144]
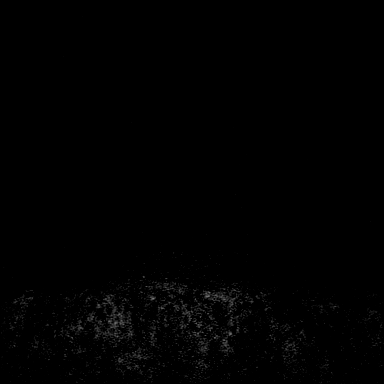
[im 36/144]
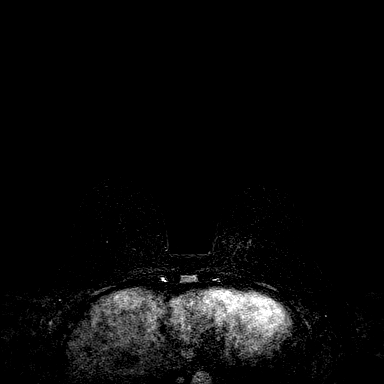
[im 72/144]
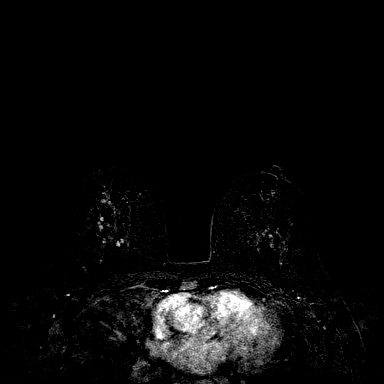
[im 108/144]
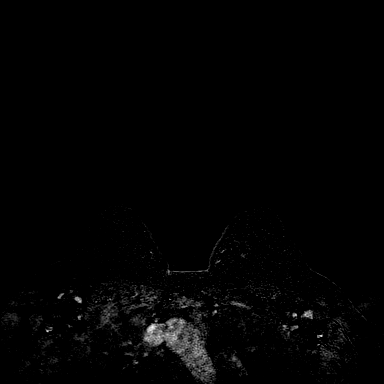
[im 144/144]
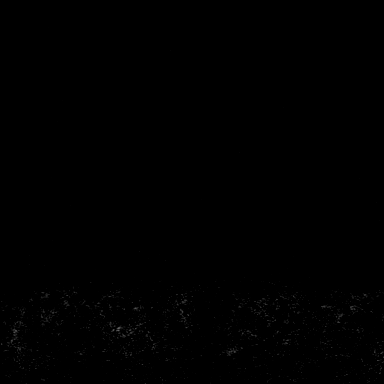

[Series 7: fl3d post-cm 20 · axial · 172.8mm · 0.94mm/px · 1 of 1 slices shown (3 of 3)]
[im 1/1]
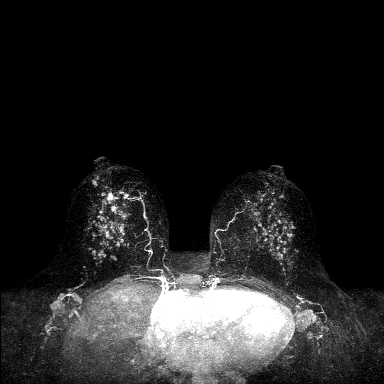

[Series 8: fl3d post-cm 3min · axial · 1.2mm · 0.94mm/px · z∈[-88,+83]mm · 6 of 144 slices shown]
[im 1/144]
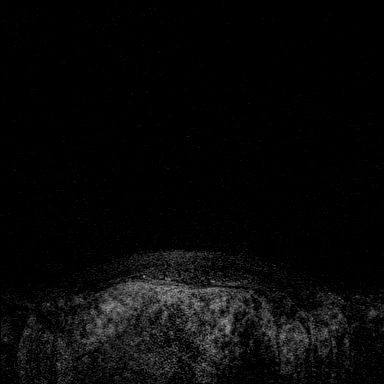
[im 29/144]
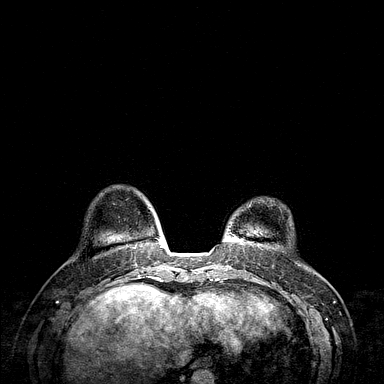
[im 58/144]
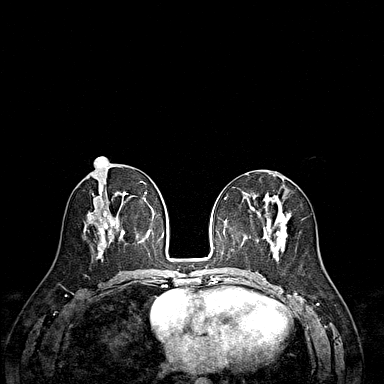
[im 86/144]
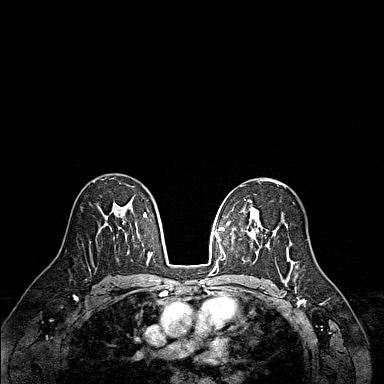
[im 115/144]
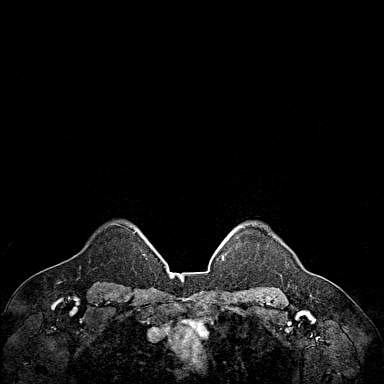
[im 144/144]
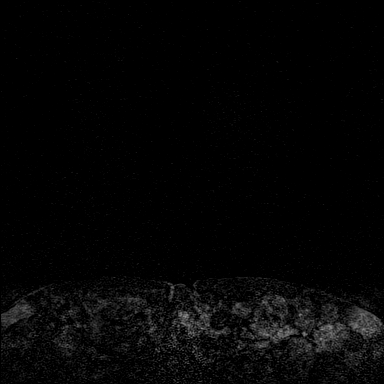

[Series 9: fl3d post-cm 3min_sub · axial · 1.2mm · 0.94mm/px · z∈[-88,-20]mm · 3 of 144 slices shown]
[im 1/144]
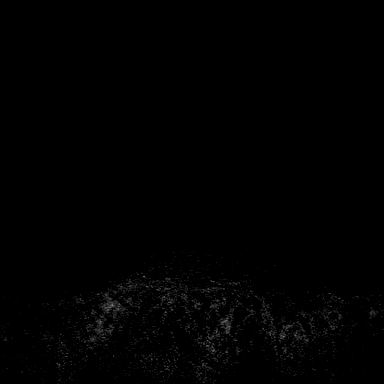
[im 29/144]
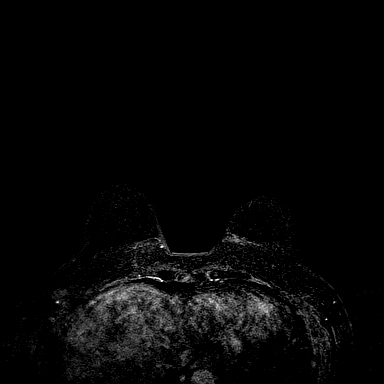
[im 58/144]
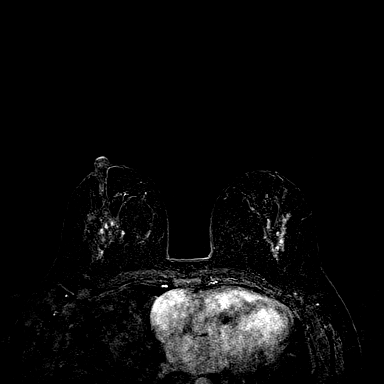

[31 of 48 positions shown; findings below may reference images not displayed]

THREE-DIMENSIONAL MR IMAGE RENDERING ON INDEPENDENT WORKSTATION:

Three-dimensional MR images were rendered by post-processing of the
original MR data on an independent workstation. The
three-dimensional MR images were interpreted, and findings are
reported in the following complete MRI report for this study. Three
dimensional images were evaluated at the independent [employer]
workstation
FINDINGS: Breast composition: b. Scattered fibroglandular tissue.

Background parenchymal enhancement: Moderate.

Right breast: There are 2 adjacent progressively enhancing masses in
the right 12 o'clock breast, anterior to middle depth, measuring 7 x
8 x 8 and 7 x 6 x 5 mm. Other few mm enhancing foci are seen
throughout the central and lower right breast. A more prominent 8 mm
focus of enhancement is present in the right breast lower inner
quadrant, middle depth, at approximately 5 o'clock. Several adjacent
similar in enhancement characteristics foci are seen in the right
9:30 o'clock breast, posterior depth. No abnormal skin or nipple
enhancement. No skin or trabecular edema. Several benign-appearing
cysts are present.

Left breast: Scattered progressively enhancing less than 5 mm foci
are seen throughout the breast, likely representing fibrocystic
changes

Lymph nodes: A 9 mm in long-axis lymph node with slightly asymmetric
cortical thickening is seen in the high right axilla. No evidence of
frank lymphadenopathy. Several benign-appearing cysts are present.

Ancillary findings:  None.
IMPRESSION: Two adjacent progressively enhancing masses in the right 12 o'clock
breast, anterior to middle depth, for which MRI guided core needle
biopsy is recommended.

Other smaller enhancing foci are present throughout the central and
lower right breast with uncertain significance. These may represent
fibrocystic foci, however malignancy cannot be entirely excluded. If
pathology results from the right 12 o'clock breast biopsy are
benign, follow-up with MRI in 6 months is recommended. If pathology
results from the right breast 12 o'clock biopsy are malignant, then
MRI guided biopsy of the right breast 5 o'clock and 9:30 o'clock
foci is recommended.

One high right axillary lymph node with slight asymmetric cortical
thickening, indeterminate. Ultrasound of the right axilla may be
considered.

No enhancing fluid collections to suggest an ongoing right breast
abscess.

RECOMMENDATION:
MRI guided core needle biopsy of right 12 o'clock breast.

Focused right axillary ultrasound.

BI-RADS CATEGORY  4: Suspicious.

## 2018-01-23 ENCOUNTER — Ambulatory Visit (HOSPITAL_COMMUNITY): Payer: Self-pay | Admitting: Psychiatry

## 2018-02-01 ENCOUNTER — Other Ambulatory Visit (HOSPITAL_COMMUNITY): Payer: Self-pay

## 2018-02-01 DIAGNOSIS — F330 Major depressive disorder, recurrent, mild: Secondary | ICD-10-CM

## 2018-02-01 MED ORDER — DULOXETINE HCL 60 MG PO CPEP: 60.0000 mg | ORAL_CAPSULE | Freq: Every day | ORAL | 0 refills | Status: DC

## 2018-02-06 ENCOUNTER — Other Ambulatory Visit: Payer: Self-pay

## 2018-02-06 DIAGNOSIS — I10 Essential (primary) hypertension: Secondary | ICD-10-CM

## 2018-02-06 MED ORDER — LOSARTAN POTASSIUM 100 MG PO TABS: 100.0000 mg | ORAL_TABLET | Freq: Every day | ORAL | 0 refills | Status: DC

## 2018-02-27 ENCOUNTER — Ambulatory Visit (HOSPITAL_COMMUNITY): Payer: Self-pay | Admitting: Psychiatry

## 2018-03-03 ENCOUNTER — Ambulatory Visit: Payer: 59 | Admitting: Nurse Practitioner

## 2018-03-03 ENCOUNTER — Other Ambulatory Visit (INDEPENDENT_AMBULATORY_CARE_PROVIDER_SITE_OTHER): Payer: 59

## 2018-03-03 ENCOUNTER — Encounter: Payer: Self-pay | Admitting: Nurse Practitioner

## 2018-03-03 VITALS — BP 128/78 | HR 76 | Temp 98.2°F | Resp 16 | Ht 62.0 in | Wt 134.8 lb

## 2018-03-03 DIAGNOSIS — I10 Essential (primary) hypertension: Secondary | ICD-10-CM

## 2018-03-03 DIAGNOSIS — R42 Dizziness and giddiness: Secondary | ICD-10-CM

## 2018-03-03 DIAGNOSIS — N6081 Other benign mammary dysplasias of right breast: Secondary | ICD-10-CM | POA: Insufficient documentation

## 2018-03-03 HISTORY — DX: Other benign mammary dysplasias of right breast: N60.81

## 2018-03-03 LAB — COMPREHENSIVE METABOLIC PANEL
ALT: 16 U/L (ref 0–35)
AST: 14 U/L (ref 0–37)
Albumin: 4.3 g/dL (ref 3.5–5.2)
Alkaline Phosphatase: 77 U/L (ref 39–117)
BUN: 13 mg/dL (ref 6–23)
CHLORIDE: 102 meq/L (ref 96–112)
CO2: 31 mEq/L (ref 19–32)
CREATININE: 0.71 mg/dL (ref 0.40–1.20)
Calcium: 9.2 mg/dL (ref 8.4–10.5)
GFR: 91.45 mL/min (ref >60.00)
GLUCOSE: 84 mg/dL (ref 70–99)
POTASSIUM: 4.2 meq/L (ref 3.5–5.1)
SODIUM: 137 meq/L (ref 135–145)
TOTAL PROTEIN: 6.5 g/dL (ref 6.0–8.3)
Total Bilirubin: 0.4 mg/dL (ref 0.2–1.2)

## 2018-03-03 LAB — CBC
HCT: 38.4 % (ref 36.0–46.0)
Hemoglobin: 13 g/dL (ref 12.0–15.0)
MCHC: 33.7 g/dL (ref 30.0–36.0)
MCV: 86.1 fl (ref 78.0–100.0)
PLATELETS: 188 10*3/uL (ref 150.0–400.0)
RBC: 4.46 Mil/uL (ref 3.87–5.11)
RDW: 13.3 % (ref 11.5–15.5)
WBC: 4.6 10*3/uL (ref 4.0–10.5)

## 2018-03-03 LAB — TSH: TSH: 2.86 u[IU]/mL (ref 0.35–4.50)

## 2018-03-03 MED ORDER — LOSARTAN POTASSIUM 100 MG PO TABS: 100.0000 mg | ORAL_TABLET | Freq: Every day | ORAL | 2 refills | Status: DC

## 2018-03-03 NOTE — Patient Instructions (Addendum)
Please head downstairs for lab work.  I have placed a referral to cardiology. Our office will call you to schedule this appointment. You should hear from our office in 7-10 days.  Please try to check your blood pressure once daily or at least a few times a week, at the same time each day, and keep a log. Let me know if you get readings over 140/90.  Id like to see you back in about 3 months, lets make sure your blood pressure is stable and we can do your annual physical if you are feeling well.  It was good to meet you. Thanks for letting me take care of you today :)  Dizziness Dizziness is a common problem. It makes you feel unsteady or light-headed. You may feel like you are about to pass out (faint). Dizziness can lead to getting hurt if you stumble or fall. Dizziness can be caused by many things, including:  Medicines.  Not having enough water in your body (dehydration).  Illness.  Follow these instructions at home: Eating and drinking  Drink enough fluid to keep your pee (urine) clear or pale yellow. This helps to keep you from getting dehydrated. Try to drink more clear fluids, such as water.  Do not drink alcohol.  Limit how much caffeine you drink or eat, if your doctor tells you to do that.  Limit how much salt (sodium) you drink or eat, if your doctor tells you to do that. Activity  Avoid making quick movements. ? When you stand up from sitting in a chair, steady yourself until you feel okay. ? In the morning, first sit up on the side of the bed. When you feel okay, stand slowly while you hold onto something. Do this until you know that your balance is fine.  If you need to stand in one place for a long time, move your legs often. Tighten and relax the muscles in your legs while you are standing.  Do not drive or use heavy machinery if you feel dizzy.  Avoid bending down if you feel dizzy. Place items in your home so you can reach them easily without leaning  over. Lifestyle  Do not use any products that contain nicotine or tobacco, such as cigarettes and e-cigarettes. If you need help quitting, ask your doctor.  Try to lower your stress level. You can do this by using methods such as yoga or meditation. Talk with your doctor if you need help. General instructions  Watch your dizziness for any changes.  Take over-the-counter and prescription medicines only as told by your doctor. Talk with your doctor if you think that you are dizzy because of a medicine that you are taking.  Tell a friend or a family member that you are feeling dizzy. If he or she notices any changes in your behavior, have this person call your doctor.  Keep all follow-up visits as told by your doctor. This is important. Contact a doctor if:  Your dizziness does not go away.  Your dizziness or light-headedness gets worse.  You feel sick to your stomach (nauseous).  You have trouble hearing.  You have new symptoms.  You are unsteady on your feet.  You feel like the room is spinning. Get help right away if:  You throw up (vomit) or have watery poop (diarrhea), and you cannot eat or drink anything.  You have trouble: ? Talking. ? Walking. ? Swallowing. ? Using your arms, hands, or legs.  You feel  generally weak.  You are not thinking clearly, or you have trouble forming sentences. A friend or family member may notice this.  You have: ? Chest pain. ? Pain in your belly (abdomen). ? Shortness of breath. ? Sweating.  Your vision changes.  You are bleeding.  You have a very bad headache.  You have neck pain or a stiff neck.  You have a fever. These symptoms may be an emergency. Do not wait to see if the symptoms will go away. Get medical help right away. Call your local emergency services (911 in the [place] Do not drive yourself to the hospital. Summary  Dizziness makes you feel unsteady or light-headed. You may feel like you are about to pass out  (faint).  Drink enough fluid to keep your pee (urine) clear or pale yellow. Do not drink alcohol.  Avoid making quick movements if you feel dizzy.  Watch your dizziness for any changes. This information is not intended to replace advice given to you by your health care provider. Make sure you discuss any questions you have with your health care provider. Document Released: 11/25/2011 Document Revised: 12/23/2016 Document Reviewed: 12/23/2016 Elsevier Interactive Patient Education  2017 [employer].

## 2018-03-03 NOTE — Progress Notes (Signed)
Name: Stephanie Gray   MRN: [id number]    DOB: 12/14/1966   Date:03/03/2018       Progress Note  Subjective  Chief Complaint  Chief Complaint  Patient presents with  . Establish Care    blood pressure, light headed    HPI Stephanie Gray is establishing care with me today, transferring from another provider within the same clinic who has left. She is following up on her blood pressure today. She is also currently following with psychiatry for major depressive disorder and oncology, urogynecology for apocrine metaplasia of her right breast and menopausal symptoms.  Hypertension -maintained on losartan 100 daily Reports daily medication compliance without adverse medication effects. Reports she checks her blood pressure about once or twice a week at home- readings typically 120s/80s. She feels like her blood pressure always increases when she is in the clinical setting but does admit she has noticed some higher readings recently with recent weight gain- bottom number has been closer to 90 occassionally Denies headaches, vision changes, chest pain, shortness of breath, edema.  BP Readings from Last 3 Encounters:  03/03/18 128/78  10/24/17 122/70  08/23/17 122/80    Lightheadedness- This is a fairly new problem she has noticed over the past 2 months or so.  She has noticed feeling some lightheadedness about one to two hours after taking her morning medications-cymbalta and lyrica She has been on both cymbalta and lyrica for years with no recent medication adjustments. She did recently start on prempro? by urogynecology for menopausal symptoms-but had noticed the lightheadedness prior to this new medication. She denies syncope, fevers, confusion, weakness, slurred speech. She does experience occasional palpitations, which have been occurring for years She says she was actually told many years ago by her mothers cardiologist that she should have testing of her heart to evaluate for  congenital heart defect that her mother has been diagnosed with. She has not followed up with any provider to discuss this. 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  Patient Active Problem List   Diagnosis Date Noted  . Apocrine metaplasia of breast, right 03/03/2018  . Hypertension 06/28/2016  . Routine general medical examination at a health care facility 04/06/2016  . Elevated blood pressure reading without diagnosis of hypertension 06/12/2015  . Tubular adenoma of colon 04/27/2015  . Nonspecific abnormal results of thyroid function study 04/14/2009  . DEPRESSION 04/14/2009  . NONSPECIFIC ABNORMAL ELECTROCARDIOGRAM 04/14/2009    Past Surgical History:  Procedure Laterality Date  . APPENDECTOMY    . BLADDER REPAIR    . BREAST LUMPECTOMY WITH RADIOACTIVE SEED LOCALIZATION Right 02/10/2017   Procedure: RIGHT BREAST LUMPECTOMY WITH RADIOACTIVE SEED LOCALIZATION;  Surgeon: Autumn Messing III, MD;  Location: [facility];  Service: General;  Laterality: Right;  . CESAREAN SECTION     X 2, Dr Reino Kent  . EYE SURGERY Bilateral    Lasik   . TUBAL LIGATION     bladder laceration repair    Family History  Problem Relation Age of Onset  . Hypertension Mother   . Diabetes Father   . Heart disease Father   . Hypertension Father   . Stroke Maternal Grandfather 72       dementia  . Cancer Paternal Grandfather        testicular  . Mitral valve prolapse Maternal Grandmother   . Heart disease Paternal Grandmother   . Breast cancer Paternal Grandmother   . Other Sister  neurologic issue    Social History   Socioeconomic History  . Marital status: Married    Spouse name: Not on file  . Number of children: 2  . Years of education: 26  . Highest education level: Not on file  Social Needs  . Financial resource strain: Not on file  . Food insecurity - worry: Not on file  . Food insecurity - inability: Not on file  . Transportation needs - medical:  Not on file  . Transportation needs - non-medical: Not on file  Occupational History  . Occupation: [employer]  Tobacco Use  . Smoking status: Never Smoker  . Smokeless tobacco: Never Used  Substance and Sexual Activity  . Alcohol use: Yes    Comment: Rarely  . Drug use: No  . Sexual activity: Yes    Birth control/protection: None  Other Topics Concern  . Not on file  Social History Narrative   Fun: Ride horses.   Denies abuse and feels safe at home.      Current Outpatient Medications:  .  DULoxetine (CYMBALTA) 60 MG capsule, Take 1 capsule (60 mg total) by mouth daily., Disp: 30 capsule, Rfl: 0 .  losartan (COZAAR) 100 MG tablet, Take 1 tablet (100 mg total) by mouth daily., Disp: 90 tablet, Rfl: 2  Allergies  Allergen Reactions  . Benzodiazepines Other (See Comments)    Patient needed crash cart after tubal ligation  . Other Other (See Comments)    Patient is extremely sensitive to all medicines, maybe needs 1/4 or 1/8 dosage of normal adult  . Augmentin [Amoxicillin-Pot Clavulanate] Other (See Comments)  . Lamictal [Lamotrigine] Rash  . Penicillins Rash    Rash because of a history of documented adverse serious drug reaction;Medi Alert bracelet  is recommended (CHILDHOOD REACTION) Has patient had a PCN reaction causing immediate rash, facial/tongue/throat swelling, SOB or lightheadedness with hypotension:UNKNOWN Has patient had a PCN reaction causing severe rash involving mucus membranes or skin necrosis:unsure Has patient had a PCN reaction that required hospitalization:unsure Has patient had a PCN reaction occurring within the last 10 years:unsure      ROS See HPI  Objective  Vitals:   03/03/18 0902 03/03/18 0934  BP: (!) 140/92 128/78  Pulse: 76   Resp: 16   Temp: 98.2 F (36.8 C)   TempSrc: Oral   SpO2: 97%   Weight: 134 lb 12.8 oz (61.1 kg)   Height: 5\' 2"  (1.575 m)    Body mass index is 24.66 kg/m.  Physical Exam VItal signs  reviewed. Constitutional: Patient appears well-developed and well-nourished. No distress.  HENT: Head: Normocephalic and atraumatic. Nose: Nose normal. Mouth/Throat: Oropharynx is clear and moist. Eyes: Conjunctivae and EOM are normal. Pupils are equal, round, and reactive to light. No scleral icterus.  Neck: Normal range of motion. Neck supple. No JVD present. No thyromegaly present.  Cardiovascular: Normal rate, regular rhythm and normal heart sounds.  No murmur heard. No BLE edema. Distal pulses intact. Pulmonary/Chest: Effort normal and breath sounds normal. No respiratory distress. Abdominal: Soft. No distension. Neurological: She is alert and oriented to person, place, and time. Coordination, balance, strength, speech and gait are normal.  Skin: Skin is warm and dry. No rash noted. No erythema.  Psychiatric: Patient has a normal mood and affect. behavior is normal. Judgment and thought content normal.   Assessment & Plan RTC in 3 month for F/U- BP recheck, dizziness; CPE if stable   Lightheadedness Could be related to medication dosing/side effect  but should rule out other conditions first- will refer to cardiology for evaluation today due to possibility of hereditary heart defect and palpitations. We can re-consider medications given all other testing/evaluations are normal. We also discussed staying hydrated especially given she works on a farm all day. - Comprehensive metabolic panel; Future - TSH; Future - CBC; Future - Ambulatory referral to Cardiology - EKG 12-Lead: I have personally reviewed the EKG tracing and agree with computerized printout as noted: sinus rhythm without acute abnormalities. Return precautions including calling 911 for chest pain or shortness of breath were discussed and printed in AVS-See AVS for additional information provided to patient. RTC in 3 months for F/U or sooner if needed

## 2018-03-03 NOTE — Assessment & Plan Note (Signed)
BP initially elevated but decreased on recheck. Continue current losartan at current dosage and she will continue to monitor blood pressure at home-F/u For readings over 140/90 at home - Comprehensive metabolic panel; Future - losartan (COZAAR) 100 MG tablet; Take 1 tablet (100 mg total) by mouth daily.  Dispense: 90 tablet; Refill: 2

## 2018-03-13 ENCOUNTER — Ambulatory Visit (HOSPITAL_COMMUNITY): Payer: 59 | Admitting: Psychiatry

## 2018-03-13 ENCOUNTER — Encounter (HOSPITAL_COMMUNITY): Payer: Self-pay | Admitting: Psychiatry

## 2018-03-13 DIAGNOSIS — F330 Major depressive disorder, recurrent, mild: Secondary | ICD-10-CM

## 2018-03-13 MED ORDER — DULOXETINE HCL 60 MG PO CPEP: 60.0000 mg | ORAL_CAPSULE | Freq: Every day | ORAL | 0 refills | Status: DC

## 2018-03-13 NOTE — Progress Notes (Signed)
BH MD/PA/NP OP Progress Note  03/13/2018 9:53 AM Stephanie Gray  MRN:  [id number]  Chief Complaint:  I apologized missing appointment.  I have been very busy traveling with my children.  HPI: Patient came for her follow-up appointment.  She apologized missing the appointment.  She is been very busy because her adopted children's biological parents visited from [place] and they have been staying for past few months.  She is been taking to the different places.  Patient doing very well on her current medication.  She lost weight as she has been watching her calorie intake and doing weight watcher.  She also had a horse barn which she really enjoy when she gets nervous and anxious.  She has adopted children from [place] who are very good and basketball and patient is visiting them frequently.  They playing basketball with [employer] and other universities.  One of them is playing in [place] because he had issues to get a visa.  Patient recently seen her primary care physician and her blood pressure medication dose reduced because she lost weight and her blood pressure is much better.  Patient denies any irritability, anger, mania or any psychosis.  She denies any feeling of hopelessness or worthlessness.  Her energy level is good.  Patient lives with her husband and she has 2 biological son and 3 adopted [place] athletes.  Patient like Cymbalta.  She has no tremors shakes or any EPS.   Visit Diagnosis:    ICD-10-CM   1. MDD (major depressive disorder), recurrent episode, mild (HCC) F33.0 DULoxetine (CYMBALTA) 60 MG capsule    Past Psychiatric History: Viewed Patient has history of depression most of her life but started medication after her first son was born.  She had tried Paxil, Zoloft and propanolol for anxiety.  She denies any history of psychiatric inpatient treatment or any suicidal attempt.  She admitted episodes of hypomanic-like symptoms when she has more energy and poor sleep but denies  any functional impairment.  She denies any history of paranoia or any hallucination.  She was seeing Letta Moynahan for more than 10 years.  She also tried Lamictal but it causes a rash and she was given steroids.  Past Medical History:  Past Medical History:  Diagnosis Date  . Anxiety   . Arthritis   . Cervical disc disease   . Complication of anesthesia    very sensitive to all mdications,needs lower dosage  . Depression   . Hypertension   . Neuromuscular disorder (HCC)    numbness in 2 fingers of left hand  . Thyroid disease    Right breast has discharge started when she had baby and has neve goneaway    Past Surgical History:  Procedure Laterality Date  . APPENDECTOMY    . BLADDER REPAIR    . BREAST LUMPECTOMY WITH RADIOACTIVE SEED LOCALIZATION Right 02/10/2017   Procedure: RIGHT BREAST LUMPECTOMY WITH RADIOACTIVE SEED LOCALIZATION;  Surgeon: Autumn Messing III, MD;  Location: [place];  Service: General;  Laterality: Right;  . CESAREAN SECTION     X 2, Dr Reino Kent  . EYE SURGERY Bilateral    Lasik   . TUBAL LIGATION     bladder laceration repair    Family Psychiatric History: Viewed  Family History:  Family History  Problem Relation Age of Onset  . Hypertension Mother   . Other Mother        congenital heart defect  . Diabetes Father   . Heart disease Father   .  Hypertension Father   . Stroke Maternal Grandfather 72       dementia  . Cancer Paternal Grandfather        testicular  . Mitral valve prolapse Maternal Grandmother   . Heart disease Paternal Grandmother   . Breast cancer Paternal Grandmother   . Other Sister        neurologic issue    Social History:  Social History   Socioeconomic History  . Marital status: Married    Spouse name: Not on file  . Number of children: 2  . Years of education: 71  . Highest education level: Not on file  Occupational History  . Occupation: [employer]  Social Needs  . Financial resource strain: Not on file  . Food  insecurity:    Worry: Not on file    Inability: Not on file  . Transportation needs:    Medical: Not on file    Non-medical: Not on file  Tobacco Use  . Smoking status: Never Smoker  . Smokeless tobacco: Never Used  Substance and Sexual Activity  . Alcohol use: Yes    Comment: Rarely  . Drug use: No  . Sexual activity: Yes    Birth control/protection: None  Lifestyle  . Physical activity:    Days per week: Not on file    Minutes per session: Not on file  . Stress: Not on file  Relationships  . Social connections:    Talks on phone: Not on file    Gets together: Not on file    Attends religious service: Not on file    Active member of club or organization: Not on file    Attends meetings of clubs or organizations: Not on file    Relationship status: Not on file  Other Topics Concern  . Not on file  Social History Narrative   Fun: Ride horses.   Denies abuse and feels safe at home.     Allergies:  Allergies  Allergen Reactions  . Benzodiazepines Other (See Comments)    Patient needed crash cart after tubal ligation  . Other Other (See Comments)    Patient is extremely sensitive to all medicines, maybe needs 1/4 or 1/8 dosage of normal adult  . Augmentin [Amoxicillin-Pot Clavulanate] Other (See Comments)  . Lamictal [Lamotrigine] Rash  . Penicillins Rash    Rash because of a history of documented adverse serious drug reaction;Medi Alert bracelet  is recommended (CHILDHOOD REACTION) Has patient had a PCN reaction causing immediate rash, facial/tongue/throat swelling, SOB or lightheadedness with hypotension:UNKNOWN Has patient had a PCN reaction causing severe rash involving mucus membranes or skin necrosis:unsure Has patient had a PCN reaction that required hospitalization:unsure Has patient had a PCN reaction occurring within the last 10 years:unsure     Metabolic Disorder Labs: Recent Results (from the past 2160 hour(s))  CBC     Status: None   Collection Time:  03/03/18 10:10 AM  Result Value Ref Range   WBC 4.6 4.0 - 10.5 K/uL   RBC 4.46 3.87 - 5.11 Mil/uL   Platelets 188.0 150.0 - 400.0 K/uL   Hemoglobin 13.0 12.0 - 15.0 g/dL   HCT 38.4 36.0 - 46.0 %   MCV 86.1 78.0 - 100.0 fl   MCHC 33.7 30.0 - 36.0 g/dL   RDW 13.3 11.5 - 15.5 %  TSH     Status: None   Collection Time: 03/03/18 10:10 AM  Result Value Ref Range   TSH 2.86 0.35 - 4.50 uIU/mL  Comprehensive metabolic panel     Status: None   Collection Time: 03/03/18 10:10 AM  Result Value Ref Range   Sodium 137 135 - 145 mEq/L   Potassium 4.2 3.5 - 5.1 mEq/L   Chloride 102 96 - 112 mEq/L   CO2 31 19 - 32 mEq/L   Glucose, Bld 84 70 - 99 mg/dL   BUN 13 6 - 23 mg/dL   Creatinine, Ser 0.71 0.40 - 1.20 mg/dL   Total Bilirubin 0.4 0.2 - 1.2 mg/dL   Alkaline Phosphatase 77 39 - 117 U/L   AST 14 0 - 37 U/L   ALT 16 0 - 35 U/L   Total Protein 6.5 6.0 - 8.3 g/dL   Albumin 4.3 3.5 - 5.2 g/dL   Calcium 9.2 8.4 - 10.5 mg/dL   GFR 91.45 >60.00 mL/min   No results found for: HGBA1C, MPG No results found for: PROLACTIN Lab Results  Component Value Date   CHOL 144 10/30/2012   TRIG 105.0 10/30/2012   HDL 63.80 10/30/2012   CHOLHDL 2 10/30/2012   VLDL 21.0 10/30/2012   LDLCALC 59 10/30/2012   Lab Results  Component Value Date   TSH 2.86 03/03/2018   TSH 2.237 06/29/2016    Therapeutic Level Labs: No results found for: LITHIUM No results found for: VALPROATE No components found for:  CBMZ  Current Medications: Current Outpatient Medications  Medication Sig Dispense Refill  . DULoxetine (CYMBALTA) 60 MG capsule Take 1 capsule (60 mg total) by mouth daily. 30 capsule 0  . estrogen, conjugated,-medroxyprogesterone (PREMPRO) 0.3-1.5 MG tablet Take 1 tablet by mouth daily.    Marland Kitchen losartan (COZAAR) 100 MG tablet Take 1 tablet (100 mg total) by mouth daily. 90 tablet 2   No current facility-administered medications for this visit.      Musculoskeletal: Strength & Muscle Tone: within  normal limits Gait & Station: normal Patient leans: N/A  Psychiatric Specialty Exam: ROS  Blood pressure 112/77, pulse 68, height 5\' 2"  (1.575 m), weight 133 lb 3.2 oz (60.4 kg), last menstrual period 10/10/2013, SpO2 97 %.Body mass index is 24.36 kg/m.  General Appearance: Casual  Eye Contact:  Good  Speech:  Clear and Coherent  Volume:  Normal  Mood:  Euthymic  Affect:  Congruent  Thought Process:  Goal Directed  Orientation:  Full (Time, Place, and Person)  Thought Content: Logical   Suicidal Thoughts:  No  Homicidal Thoughts:  No  Memory:  Immediate;   Good Recent;   Good Remote;   Good  Judgement:  Good  Insight:  Good  Psychomotor Activity:  Normal  Concentration:  Concentration: Good and Attention Span: Good  Recall:  Good  Fund of Knowledge: Good  Language: Good  Akathisia:  No  Handed:  Right  AIMS (if indicated): not done  Assets:  Communication Skills Desire for Improvement Housing Resilience Social Support  ADL's:  Intact  Cognition: WNL  Sleep:  Good   Screenings:   Assessment and Plan: Major depressive disorder, recurrent.  Reinforced compliance with medication and follow-up treatments.  Patient doing better on her current medication.  Continue Cymbalta 60 mg daily.  Patient is not interested in counseling.  Recommended to call us back if she has any question, concern if she feels worsening of the symptoms.  Follow-up in 3 months.   Kathlee Nations, MD 03/13/2018, 9:53 AM

## 2018-03-17 DIAGNOSIS — M1990 Unspecified osteoarthritis, unspecified site: Secondary | ICD-10-CM | POA: Insufficient documentation

## 2018-03-17 DIAGNOSIS — E039 Hypothyroidism, unspecified: Secondary | ICD-10-CM

## 2018-03-17 DIAGNOSIS — R42 Dizziness and giddiness: Secondary | ICD-10-CM | POA: Insufficient documentation

## 2018-03-17 DIAGNOSIS — N920 Excessive and frequent menstruation with regular cycle: Secondary | ICD-10-CM | POA: Insufficient documentation

## 2018-03-17 DIAGNOSIS — F32A Depression, unspecified: Secondary | ICD-10-CM | POA: Insufficient documentation

## 2018-03-17 DIAGNOSIS — G709 Myoneural disorder, unspecified: Secondary | ICD-10-CM | POA: Insufficient documentation

## 2018-03-17 DIAGNOSIS — N959 Unspecified menopausal and perimenopausal disorder: Secondary | ICD-10-CM | POA: Insufficient documentation

## 2018-03-17 DIAGNOSIS — E079 Disorder of thyroid, unspecified: Secondary | ICD-10-CM | POA: Insufficient documentation

## 2018-03-17 DIAGNOSIS — F419 Anxiety disorder, unspecified: Secondary | ICD-10-CM | POA: Insufficient documentation

## 2018-03-17 DIAGNOSIS — F329 Major depressive disorder, single episode, unspecified: Secondary | ICD-10-CM | POA: Insufficient documentation

## 2018-03-17 HISTORY — DX: Hypothyroidism, unspecified: E03.9

## 2018-03-17 HISTORY — DX: Unspecified menopausal and perimenopausal disorder: N95.9

## 2018-03-27 NOTE — Progress Notes (Deleted)
Cardiology Office Note:    Date:  03/27/2018   ID:  Stephanie Gray, DOB Aug 30, 1966, MRN [id number]  PCP:  Lance Sell, NP  Cardiologist:  Shirlee More, MD   Referring MD: Lance Sell, NP  ASSESSMENT:    No diagnosis found. PLAN:    In order of problems listed above:  1. ***  Next appointment   Medication Adjustments/Labs and Tests Ordered: Current medicines are reviewed at length with the patient today.  Concerns regarding medicines are outlined above.  No orders of the defined types were placed in this encounter.  No orders of the defined types were placed in this encounter.    No chief complaint on file. ***  History of Present Illness:    Stephanie Gray is a 52 y.o. female with hypertension who is being seen today for the evaluation of *** at the request of Lance Sell, NP. 03/03/18 : Lightheadedness- This is a fairly new problem she has noticed over the past 2 months or so.  She has noticed feeling some lightheadedness about one to two hours after taking her morning medications-cymbalta and lyrica She has been on both cymbalta and lyrica for years with no recent medication adjustments. She did recently start on prempro? by urogynecology for menopausal symptoms-but had noticed the lightheadedness prior to this new medication. She denies syncope, fevers, confusion, weakness, slurred speech. She does experience occasional palpitations, which have been occurring for years She says she was actually told many years ago by her mothers cardiologist that she should have testing of her heart to evaluate for congenital heart defect that her mother has been diagnosed with. She has not followed up with any provider to discuss this  Past Medical History:  Diagnosis Date  . Anxiety   . Apocrine metaplasia of breast, right 03/03/2018  . Arthritis   . Breast mass, right 04/12/2017  . Cervical disc disease   . Complication of anesthesia    very sensitive to all mdications,needs lower dosage  . Depression   . DEPRESSION 04/14/2009   Qualifier: Diagnosis of  By: Linna Darner MD, Gwyndolyn Saxon    . Elevated blood pressure reading without diagnosis of hypertension 06/12/2015  . Hypertension   . Hypothyroidism 03/17/2018  . Lightheadedness 03/17/2018  . Menorrhagia 03/17/2018  . Neuromuscular disorder (HCC)    numbness in 2 fingers of left hand  . NONSPECIFIC ABNORMAL ELECTROCARDIOGRAM 04/14/2009   Qualifier: Diagnosis of  By: Linna Darner MD, Gwyndolyn Saxon    . Nonspecific abnormal results of thyroid function study 04/14/2009   Qualifier: Diagnosis of  By: Linna Darner MD, Gwyndolyn Saxon    . Perimenopausal disorder 03/17/2018  . Routine general medical examination at a health care facility 04/06/2016  . Thyroid disease    Right breast has discharge started when she had baby and has neve goneaway  . Tubular adenoma of colon 04/27/2015   04/07/2015 @ 70 cm ,polyp; Dr Collene Mares Due 2019     Past Surgical History:  Procedure Laterality Date  . APPENDECTOMY    . BLADDER REPAIR    . BREAST LUMPECTOMY WITH RADIOACTIVE SEED LOCALIZATION Right 02/10/2017   Procedure: RIGHT BREAST LUMPECTOMY WITH RADIOACTIVE SEED LOCALIZATION;  Surgeon: Autumn Messing III, MD;  Location: [place];  Service: General;  Laterality: Right;  . CESAREAN SECTION     X 2, Dr Reino Kent  . EYE SURGERY Bilateral    Lasik   . TUBAL LIGATION     bladder laceration repair    Current Medications: No outpatient  medications have been marked as taking for the 03/28/18 encounter (Appointment) with Richardo Priest, MD.     Allergies:   Benzodiazepines; Other; Augmentin [amoxicillin-pot clavulanate]; Lamictal [lamotrigine]; and Penicillins   Social History   Socioeconomic History  . Marital status: Married    Spouse name: Not on file  . Number of children: 2  . Years of education: 78  . Highest education level: Not on file  Occupational History  . Occupation: [employer]  Social Needs  . Financial resource strain:  Not on file  . Food insecurity:    Worry: Not on file    Inability: Not on file  . Transportation needs:    Medical: Not on file    Non-medical: Not on file  Tobacco Use  . Smoking status: Never Smoker  . Smokeless tobacco: Never Used  Substance and Sexual Activity  . Alcohol use: Yes    Comment: Rarely  . Drug use: No  . Sexual activity: Yes    Birth control/protection: None  Lifestyle  . Physical activity:    Days per week: Not on file    Minutes per session: Not on file  . Stress: Not on file  Relationships  . Social connections:    Talks on phone: Not on file    Gets together: Not on file    Attends religious service: Not on file    Active member of club or organization: Not on file    Attends meetings of clubs or organizations: Not on file    Relationship status: Not on file  Other Topics Concern  . Not on file  Social History Narrative   Fun: Ride horses.   Denies abuse and feels safe at home.      Family History: The patient's ***family history includes Breast cancer in her paternal grandmother; Cancer in her paternal grandfather; Diabetes in her father; Heart disease in her father and paternal grandmother; Hypertension in her father and mother; Mitral valve prolapse in her maternal grandmother; Other in her mother and sister; Stroke (age of onset: 53) in her maternal grandfather.  ROS:   ROS Please see the history of present illness.    *** All other systems reviewed and are negative.  EKGs/Labs/Other Studies Reviewed:    The following studies were reviewed today: *** EKG: 03/03/18 [facility] normal EKG EKG:  EKG is *** ordered today.  The ekg ordered today demonstrates ***  Recent Labs: 03/03/2018: ALT 16; BUN 13; Creatinine, Ser 0.71; Hemoglobin 13.0; Platelets 188.0; Potassium 4.2; Sodium 137; TSH 2.86  Recent Lipid Panel    Component Value Date/Time   CHOL 144 10/30/2012 1441   TRIG 105.0 10/30/2012 1441   HDL 63.80 10/30/2012 1441   CHOLHDL 2 10/30/2012  1441   VLDL 21.0 10/30/2012 1441   LDLCALC 59 10/30/2012 1441    Physical Exam:    VS:  LMP 10/10/2013     Wt Readings from Last 3 Encounters:  03/03/18 134 lb 12.8 oz (61.1 kg)  03/30/17 157 lb (71.2 kg)  03/28/17 151 lb (68.5 kg)     GEN: *** Well nourished, well developed in no acute distress HEENT: Normal NECK: No JVD; No carotid bruits LYMPHATICS: No lymphadenopathy CARDIAC: ***RRR, no murmurs, rubs, gallops RESPIRATORY:  Clear to auscultation without rales, wheezing or rhonchi  ABDOMEN: Soft, non-tender, non-distended MUSCULOSKELETAL:  No edema; No deformity  SKIN: Warm and dry NEUROLOGIC:  Alert and oriented x 3 PSYCHIATRIC:  Normal affect     Signed, Shirlee More,  MD  03/27/2018 8:01 AM    [facility]

## 2018-03-28 ENCOUNTER — Ambulatory Visit: Payer: 59 | Admitting: Cardiology

## 2018-06-06 ENCOUNTER — Ambulatory Visit: Payer: Self-pay | Admitting: Nurse Practitioner

## 2018-06-06 DIAGNOSIS — Z0289 Encounter for other administrative examinations: Secondary | ICD-10-CM

## 2018-06-13 ENCOUNTER — Ambulatory Visit (HOSPITAL_COMMUNITY): Payer: 59 | Admitting: Psychiatry

## 2018-06-13 ENCOUNTER — Encounter (HOSPITAL_COMMUNITY): Payer: Self-pay | Admitting: Psychiatry

## 2018-06-13 DIAGNOSIS — Z79899 Other long term (current) drug therapy: Secondary | ICD-10-CM | POA: Diagnosis not present

## 2018-06-13 DIAGNOSIS — F330 Major depressive disorder, recurrent, mild: Secondary | ICD-10-CM

## 2018-06-13 MED ORDER — DULOXETINE HCL 60 MG PO CPEP: 60.0000 mg | ORAL_CAPSULE | Freq: Every day | ORAL | 0 refills | Status: DC

## 2018-06-13 NOTE — Progress Notes (Signed)
BH MD/PA/NP OP Progress Note  06/13/2018 9:59 AM Stephanie Gray  MRN:  [id number]  Chief Complaint: I am doing fine.  I am taking my medication.  HPI: Stephanie Gray came for her follow-up appointment.  She is taking Cymbalta 60 mg daily.  She has been lately very busy but she denies any panic attack or any depression.  2 weeks ago she had 100 people in her house for and will get together during through her husband's law firm.  She was anxious but she handle very well.  She is happy that her adopted and biological kids are doing very well.  She denies any crying spells, irritability, anger, mania, psychosis or any hallucination.  She is more focus on healthy lifestyle and eat more fruits and vegetables.  She lost 3 pounds.  Her energy level is good.  She denies drinking or using any illegal substances.  Patient lives with her husband and her 2 biological son.  She has 3 adopted [place] children who are athletes and play basketball.  Patient has no tremors, shakes or any EPS.  Visit Diagnosis:    ICD-10-CM   1. MDD (major depressive disorder), recurrent episode, mild (HCC) F33.0 DULoxetine (CYMBALTA) 60 MG capsule    DISCONTINUED: DULoxetine (CYMBALTA) 60 MG capsule    Past Psychiatric History: Reviewed. Patient has history of depression most of her life but started medication after her first son was born. She had tried Paxil, Zoloft and propanolol for anxiety. She denies any history of psychiatric inpatient treatment or any suicidal attempt. She admitted episodes of hypomanic-like symptoms when she has more energy and poor sleep but denies any functional impairment. She denies any history of paranoia or any hallucination. She was seeing Stephanie Gray for more than 10 years. She also tried Lamictal but it causes a rash and she was given steroids.  Past Medical History:  Past Medical History:  Diagnosis Date  . Anxiety   . Apocrine metaplasia of breast, right 03/03/2018  . Arthritis   .  Breast mass, right 04/12/2017  . Cervical disc disease   . Complication of anesthesia    very sensitive to all mdications,needs lower dosage  . Depression   . DEPRESSION 04/14/2009   Qualifier: Diagnosis of  By: Linna Darner MD, Gwyndolyn Saxon    . Elevated blood pressure reading without diagnosis of hypertension 06/12/2015  . Hypertension   . Hypothyroidism 03/17/2018  . Lightheadedness 03/17/2018  . Menorrhagia 03/17/2018  . Neuromuscular disorder (HCC)    numbness in 2 fingers of left hand  . NONSPECIFIC ABNORMAL ELECTROCARDIOGRAM 04/14/2009   Qualifier: Diagnosis of  By: Linna Darner MD, Gwyndolyn Saxon    . Nonspecific abnormal results of thyroid function study 04/14/2009   Qualifier: Diagnosis of  By: Linna Darner MD, Gwyndolyn Saxon    . Perimenopausal disorder 03/17/2018  . Routine general medical examination at a health care facility 04/06/2016  . Thyroid disease    Right breast has discharge started when she had baby and has neve goneaway  . Tubular adenoma of colon 04/27/2015   04/07/2015 @ 70 cm ,polyp; Dr Collene Mares Due 2019     Past Surgical History:  Procedure Laterality Date  . APPENDECTOMY    . BLADDER REPAIR    . BREAST LUMPECTOMY WITH RADIOACTIVE SEED LOCALIZATION Right 02/10/2017   Procedure: RIGHT BREAST LUMPECTOMY WITH RADIOACTIVE SEED LOCALIZATION;  Surgeon: Autumn Messing III, MD;  Location: [facility];  Service: General;  Laterality: Right;  . CESAREAN SECTION     X 2, Dr Reino Kent  .  EYE SURGERY Bilateral    Lasik   . TUBAL LIGATION     bladder laceration repair    Family Psychiatric History: Reviewed.  Family History:  Family History  Problem Relation Age of Onset  . Hypertension Mother   . Other Mother        congenital heart defect  . Diabetes Father   . Heart disease Father   . Hypertension Father   . Stroke Maternal Grandfather 72       dementia  . Cancer Paternal Grandfather        testicular  . Mitral valve prolapse Maternal Grandmother   . Heart disease Paternal Grandmother   . Breast cancer Paternal  Grandmother   . Other Sister        neurologic issue    Social History:  Social History   Socioeconomic History  . Marital status: Married    Spouse name: Not on file  . Number of children: 2  . Years of education: 34  . Highest education level: Not on file  Occupational History  . Occupation: [employer]  Social Needs  . Financial resource strain: Not on file  . Food insecurity:    Worry: Not on file    Inability: Not on file  . Transportation needs:    Medical: Not on file    Non-medical: Not on file  Tobacco Use  . Smoking status: Never Smoker  . Smokeless tobacco: Never Used  Substance and Sexual Activity  . Alcohol use: Yes    Comment: Rarely  . Drug use: No  . Sexual activity: Yes    Birth control/protection: None  Lifestyle  . Physical activity:    Days per week: Not on file    Minutes per session: Not on file  . Stress: Not on file  Relationships  . Social connections:    Talks on phone: Not on file    Gets together: Not on file    Attends religious service: Not on file    Active member of club or organization: Not on file    Attends meetings of clubs or organizations: Not on file    Relationship status: Not on file  Other Topics Concern  . Not on file  Social History Narrative   Fun: Ride horses.   Denies abuse and feels safe at home.     Allergies:  Allergies  Allergen Reactions  . Benzodiazepines Other (See Comments)    Patient needed crash cart after tubal ligation  . Other Other (See Comments)    Patient is extremely sensitive to all medicines, maybe needs 1/4 or 1/8 dosage of normal adult  . Augmentin [Amoxicillin-Pot Clavulanate] Other (See Comments)  . Lamictal [Lamotrigine] Rash  . Penicillins Rash    Rash because of a history of documented adverse serious drug reaction;Medi Alert bracelet  is recommended (CHILDHOOD REACTION) Has patient had a PCN reaction causing immediate rash, facial/tongue/throat swelling, SOB or lightheadedness with  hypotension:UNKNOWN Has patient had a PCN reaction causing severe rash involving mucus membranes or skin necrosis:unsure Has patient had a PCN reaction that required hospitalization:unsure Has patient had a PCN reaction occurring within the last 10 years:unsure     Metabolic Disorder Labs: No results found for: HGBA1C, MPG No results found for: PROLACTIN Lab Results  Component Value Date   CHOL 144 10/30/2012   TRIG 105.0 10/30/2012   HDL 63.80 10/30/2012   CHOLHDL 2 10/30/2012   VLDL 21.0 10/30/2012   LDLCALC 59 10/30/2012  Lab Results  Component Value Date   TSH 2.86 03/03/2018   TSH 2.237 06/29/2016    Therapeutic Level Labs: No results found for: LITHIUM No results found for: VALPROATE No components found for:  CBMZ  Current Medications: Current Outpatient Medications  Medication Sig Dispense Refill  . DULoxetine (CYMBALTA) 60 MG capsule Take 1 capsule (60 mg total) by mouth daily. 90 capsule 0  . estrogen, conjugated,-medroxyprogesterone (PREMPRO) 0.3-1.5 MG tablet Take 1 tablet by mouth daily.    Marland Kitchen losartan (COZAAR) 50 MG tablet   0   No current facility-administered medications for this visit.      Musculoskeletal: Strength & Muscle Tone: within normal limits Gait & Station: normal Patient leans: N/A  Psychiatric Specialty Exam: ROS  Blood pressure (!) 148/91, pulse 70, height 5\' 2"  (1.575 m), weight 130 lb (59 kg), last menstrual period 10/10/2013, SpO2 98 %.There is no height or weight on file to calculate BMI.  General Appearance: Casual  Eye Contact:  Good  Speech:  Clear and Coherent  Volume:  Normal  Mood:  Euthymic  Affect:  Appropriate  Thought Process:  Goal Directed  Orientation:  Full (Time, Place, and Person)  Thought Content: Logical   Suicidal Thoughts:  No  Homicidal Thoughts:  No  Memory:  Immediate;   Good Recent;   Good Remote;   Good  Judgement:  Good  Insight:  Good  Psychomotor Activity:  Normal  Concentration:   Concentration: Good and Attention Span: Good  Recall:  Good  Fund of Knowledge: Good  Language: Good  Akathisia:  No  Handed:  Right  AIMS (if indicated): not done  Assets:  Communication Skills Desire for Improvement Housing Resilience  ADL's:  Intact  Cognition: WNL  Sleep:  Good   Screenings:   Assessment and Plan: Major Depressive disorder, recurrent.  Patient is stable on Cymbalta 60 mg daily.  She has no side effects including tremors shakes or any EPS.  Encouraged to continue healthy lifestyle and watch her calorie intake and do regular exercise.  Continue Cymbalta 60 mg daily.  Patient is not interested in counseling.  Recommended to call us back if she has any question, concern or if she feels worsening of the symptoms.  Follow-up in 3 months.   Kathlee Nations, MD 06/13/2018, 9:59 AM

## 2018-06-13 NOTE — Addendum Note (Signed)
Addended by: Dennie Maizes E on: 06/13/2018 11:46 AM   Modules accepted: Orders

## 2018-07-21 ENCOUNTER — Other Ambulatory Visit: Payer: Self-pay | Admitting: Physician Assistant

## 2018-07-21 DIAGNOSIS — R102 Pelvic and perineal pain: Secondary | ICD-10-CM

## 2018-09-13 ENCOUNTER — Encounter (HOSPITAL_COMMUNITY): Payer: Self-pay | Admitting: Psychiatry

## 2018-09-13 ENCOUNTER — Ambulatory Visit (HOSPITAL_COMMUNITY): Payer: 59 | Admitting: Psychiatry

## 2018-09-13 VITALS — BP 137/83 | HR 80 | Ht 62.0 in | Wt 132.5 lb

## 2018-09-13 DIAGNOSIS — F330 Major depressive disorder, recurrent, mild: Secondary | ICD-10-CM | POA: Diagnosis not present

## 2018-09-13 DIAGNOSIS — F419 Anxiety disorder, unspecified: Secondary | ICD-10-CM | POA: Diagnosis not present

## 2018-09-13 DIAGNOSIS — Z79899 Other long term (current) drug therapy: Secondary | ICD-10-CM

## 2018-09-13 DIAGNOSIS — G4719 Other hypersomnia: Secondary | ICD-10-CM | POA: Diagnosis not present

## 2018-09-13 DIAGNOSIS — R0683 Snoring: Secondary | ICD-10-CM

## 2018-09-13 MED ORDER — DULOXETINE HCL 60 MG PO CPEP: 60.0000 mg | ORAL_CAPSULE | Freq: Every day | ORAL | 1 refills | Status: DC

## 2018-09-13 NOTE — Progress Notes (Signed)
BH MD/PA/NP OP Progress Note  09/13/2018 10:24 AM Stephanie Gray  MRN:  [id number]  Chief Complaint: I am doing fine.  I am taking my medication.  HPI: Stephanie Gray came for her follow-up appointment for depression, worse since onset of menopause.  She is taking Cymbalta 60 mg daily for approximately 20 years, when her son was diagnosed with Asperger's.  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  She states that her horses have been her therapy, and she does not currently follow with a psychotherapist or counselor.  She is happy that her adopted and biological kids are doing very well.  She denies any crying spells, irritability, anger, mania, psychosis or any hallucination.  She is more focus on healthy lifestyle and eat more fruits and vegetables. Her energy level and sleep is fair.  She notes that her sleep is not good, and she has daytime fatigue and snoring.  She has been diagnosed with HTN despite her healthy lifestyle.  She is agreeable to sleep study to r/o OSA as a contributor to these symptoms. She denies drinking or using any illegal substances.  Patient has no tremors, shakes or any EPS.  Visit Diagnosis:    ICD-10-CM   1. MDD (major depressive disorder), recurrent episode, mild (HCC) F33.0 DULoxetine (CYMBALTA) 60 MG capsule  2. Anxiety F41.9   3. Snores R06.83 PSG SLEEP STUDY    Ambulatory referral to Sleep Studies  4. Excessive daytime sleepiness G47.19   5. Encounter for medication management [phone]     Past Psychiatric History: Reviewed. Patient has history of depression most of her life but started medication after her first son was born. She had tried Paxil, Zoloft and propanolol for anxiety. She denies any history of psychiatric inpatient treatment or any suicidal attempt. She admitted episodes of hypomanic-like symptoms when she has more energy and poor sleep but denies any functional impairment. She denies any history of paranoia or any  hallucination. She was seeing Letta Moynahan for more than 10 years. She also tried Lamictal but it causes a rash and she was given steroids.  Past Medical History:  Past Medical History:  Diagnosis Date  . Anxiety   . Apocrine metaplasia of breast, right 03/03/2018  . Arthritis   . Breast mass, right 04/12/2017  . Cervical disc disease   . Complication of anesthesia    very sensitive to all mdications,needs lower dosage  . Depression   . DEPRESSION 04/14/2009   Qualifier: Diagnosis of  By: Linna Darner MD, Gwyndolyn Saxon    . Elevated blood pressure reading without diagnosis of hypertension 06/12/2015  . Hypertension   . Hypothyroidism 03/17/2018  . Lightheadedness 03/17/2018  . Menorrhagia 03/17/2018  . Neuromuscular disorder (HCC)    numbness in 2 fingers of left hand  . NONSPECIFIC ABNORMAL ELECTROCARDIOGRAM 04/14/2009   Qualifier: Diagnosis of  By: Linna Darner MD, Gwyndolyn Saxon    . Nonspecific abnormal results of thyroid function study 04/14/2009   Qualifier: Diagnosis of  By: Linna Darner MD, Gwyndolyn Saxon    . Perimenopausal disorder 03/17/2018  . Routine general medical examination at a health care facility 04/06/2016  . Thyroid disease    Right breast has discharge started when she had baby and has neve goneaway  . Tubular adenoma of colon 04/27/2015   04/07/2015 @ 70 cm ,polyp; Dr Collene Mares Due 2019     Past Surgical History:  Procedure Laterality Date  . APPENDECTOMY    . BLADDER REPAIR    . BREAST LUMPECTOMY  WITH RADIOACTIVE SEED LOCALIZATION Right 02/10/2017   Procedure: RIGHT BREAST LUMPECTOMY WITH RADIOACTIVE SEED LOCALIZATION;  Surgeon: Autumn Messing III, MD;  Location: [facility];  Service: General;  Laterality: Right;  . CESAREAN SECTION     X 2, Dr Reino Kent  . EYE SURGERY Bilateral    Lasik   . TUBAL LIGATION     bladder laceration repair    Family Psychiatric History: Reviewed.  Family History:  Family History  Problem Relation Age of Onset  . Hypertension Mother   . Other Mother        congenital heart  defect  . Diabetes Father   . Heart disease Father   . Hypertension Father   . Stroke Maternal Grandfather 72       dementia  . Cancer Paternal Grandfather        testicular  . Mitral valve prolapse Maternal Grandmother   . Heart disease Paternal Grandmother   . Breast cancer Paternal Grandmother   . Other Sister        neurologic issue    Social History:  Patient lives with her husband and her 2 biological son.  She has 3 adopted [place] children who are athletes and play basketball. 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 Social History   Socioeconomic History  . Marital status: Married    Spouse name: Not on file  . Number of children: 2  . Years of education: 41  . Highest education level: Not on file  Occupational History  . Occupation: Horse [employer]  Social Needs  . Financial resource strain: Not on file  . Food insecurity:    Worry: Not on file    Inability: Not on file  . Transportation needs:    Medical: Not on file    Non-medical: Not on file  Tobacco Use  . Smoking status: Never Smoker  . Smokeless tobacco: Never Used  Substance and Sexual Activity  . Alcohol use: Yes    Comment: Rarely  . Drug use: No  . Sexual activity: Yes    Birth control/protection: None  Lifestyle  . Physical activity:    Days per week: Not on file    Minutes per session: Not on file  . Stress: Not on file  Relationships  . Social connections:    Talks on phone: Not on file    Gets together: Not on file    Attends religious service: Not on file    Active member of club or organization: Not on file    Attends meetings of clubs or organizations: Not on file    Relationship status: Not on file  Other Topics Concern  . Not on file  Social History Narrative   Fun: Ride horses.   Denies abuse and feels safe at home.     Allergies:  Allergies  Allergen Reactions  . Benzodiazepines Other (See Comments)    Patient needed crash cart after tubal  ligation  . Other Other (See Comments)    Patient is extremely sensitive to all medicines, maybe needs 1/4 or 1/8 dosage of normal adult  . Augmentin [Amoxicillin-Pot Clavulanate] Other (See Comments)  . Lamictal [Lamotrigine] Rash  . Penicillins Rash    Rash because of a history of documented adverse serious drug reaction;Medi Alert bracelet  is recommended (CHILDHOOD REACTION) Has patient had a PCN reaction causing immediate rash, facial/tongue/throat swelling, SOB or lightheadedness with hypotension:UNKNOWN Has patient had a PCN reaction causing severe rash involving  mucus membranes or skin necrosis:unsure Has patient had a PCN reaction that required hospitalization:unsure Has patient had a PCN reaction occurring within the last 10 years:unsure     Metabolic Disorder Labs: No results found for: HGBA1C, MPG No results found for: PROLACTIN Lab Results  Component Value Date   CHOL 144 10/30/2012   TRIG 105.0 10/30/2012   HDL 63.80 10/30/2012   CHOLHDL 2 10/30/2012   VLDL 21.0 10/30/2012   LDLCALC 59 10/30/2012   Lab Results  Component Value Date   TSH 2.86 03/03/2018   TSH 2.237 06/29/2016    Therapeutic Level Labs: No results found for: LITHIUM No results found for: VALPROATE No components found for:  CBMZ  Current Medications: Current Outpatient Medications  Medication Sig Dispense Refill  . DULoxetine (CYMBALTA) 60 MG capsule Take 1 capsule (60 mg total) by mouth daily. 90 capsule 0  . estrogen, conjugated,-medroxyprogesterone (PREMPRO) 0.3-1.5 MG tablet Take 1 tablet by mouth daily.    Marland Kitchen losartan (COZAAR) 50 MG tablet   0   No current facility-administered medications for this visit.      Musculoskeletal: Strength & Muscle Tone: within normal limits Gait & Station: normal Patient leans: N/A  Psychiatric Specialty Exam: Review of Systems  Constitutional: Positive for malaise/fatigue and weight loss (intentional).  Respiratory:       Snores    Cardiovascular: Negative for chest pain and palpitations.  Gastrointestinal: Negative.   Musculoskeletal: Negative.   Neurological: Negative.   Psychiatric/Behavioral: Positive for depression. Negative for hallucinations, memory loss, substance abuse and suicidal ideas. The patient is nervous/anxious. The patient does not have insomnia.     Blood pressure 137/83, pulse 80, height 5\' 2"  (1.575 m), weight 132 lb 8 oz (60.1 kg), last menstrual period 10/10/2013, SpO2 98 %.Body mass index is 24.23 kg/m.  General Appearance: Casual and Well Groomed  Eye Contact:  Good  Speech:  Clear and Coherent and Normal Rate  Volume:  Normal  Mood:  Euthymic and with some tears discussing emotional issues with children  Affect:  Appropriate  Thought Process:  Coherent, Goal Directed and Linear  Orientation:  Full (Time, Place, and Person)  Thought Content: Logical and Hallucinations: None   Suicidal Thoughts:  No  Homicidal Thoughts:  No  Memory:  Immediate;   Good Recent;   Good Remote;   Good  Judgement:  Good  Insight:  Good  Psychomotor Activity:  Normal  Concentration:  Concentration: Good and Attention Span: Good  Recall:  Good  Fund of Knowledge: Good  Language: Good  Akathisia:  No  Handed:  Right  AIMS (if indicated): not done  Assets:  Communication Skills Desire for Improvement Financial Resources/Insurance Housing Intimacy Leisure Time Physical Health Resilience Social Support Talents/Skills Transportation  ADL's:  Intact  Cognition: WNL  Sleep:  Good   Screenings:  PSG for OSA and sleep clinic referral.   Assessment and Plan: Major Depressive disorder, recurrent. Anxiety. Snores. Excessive daytime sleepiness.  Patient depression s stable on Cymbalta 60 mg daily.  She has no side effects including tremors shakes or any EPS.  Encouraged to continue healthy lifestyle and watch her calorie intake and do regular exercise.  Continue Cymbalta 60 mg daily.  Patient is not  interested in counseling.   She has HTN in setting of snoring and poor sleep quality. She is agreeable to PSG for evaluation of OSA. Recommended to call us back if she has any question, concern or if she feels worsening of the symptoms.  Time spent 35 minutes.  More than 50% of the time spent in medication education, psychoeducation, counseling and coordination of care.  Discuss safety plan that anytime having active suicidal thoughts or homicidal thoughts then patient need to call 911 or go to the local emergency room.   Follow-up in 6 months and PRN.   Lavella Hammock, MD 09/13/2018, 10:24 AM

## 2018-11-01 ENCOUNTER — Ambulatory Visit: Payer: Self-pay | Admitting: Nurse Practitioner

## 2018-11-06 ENCOUNTER — Ambulatory Visit: Payer: 59 | Admitting: Nurse Practitioner

## 2018-11-06 ENCOUNTER — Other Ambulatory Visit (INDEPENDENT_AMBULATORY_CARE_PROVIDER_SITE_OTHER): Payer: 59

## 2018-11-06 ENCOUNTER — Encounter: Payer: Self-pay | Admitting: Nurse Practitioner

## 2018-11-06 VITALS — BP 140/90 | HR 91 | Ht 62.0 in | Wt 134.0 lb

## 2018-11-06 DIAGNOSIS — Z9189 Other specified personal risk factors, not elsewhere classified: Secondary | ICD-10-CM

## 2018-11-06 DIAGNOSIS — I10 Essential (primary) hypertension: Secondary | ICD-10-CM | POA: Diagnosis not present

## 2018-11-06 DIAGNOSIS — L659 Nonscarring hair loss, unspecified: Secondary | ICD-10-CM | POA: Diagnosis not present

## 2018-11-06 DIAGNOSIS — Z23 Encounter for immunization: Secondary | ICD-10-CM

## 2018-11-06 DIAGNOSIS — Z1159 Encounter for screening for other viral diseases: Secondary | ICD-10-CM

## 2018-11-06 LAB — IRON: Iron: 94 ug/dL (ref 42–145)

## 2018-11-06 LAB — TSH: TSH: 1.95 u[IU]/mL (ref 0.35–4.50)

## 2018-11-06 LAB — FERRITIN: Ferritin: 23 ng/mL (ref 10.0–291.0)

## 2018-11-06 NOTE — Patient Instructions (Addendum)
Please try to check your blood pressure once daily or at least a few times a week, at the same time each day, and keep a log. Please follow up for READINGS over 140/90.  Please return in 3-6 months for routine follow up, we can do a physical with fasting cholesterol level at next appointment.

## 2018-11-06 NOTE — Progress Notes (Signed)
Stephanie Gray is a 52 y.o. female with the following history as recorded in [employer]:  Patient Active Problem List   Diagnosis Date Noted  . Hypothyroidism 03/17/2018  . Menorrhagia 03/17/2018  . Perimenopausal disorder 03/17/2018  . Lightheadedness 03/17/2018  . Thyroid disease   . Neuromuscular disorder ([place])   . Depression   . Arthritis   . Anxiety   . Apocrine metaplasia of breast, right 03/03/2018  . Breast mass, right 04/12/2017  . Hypertension 06/28/2016  . Routine general medical examination at a health care facility 04/06/2016  . Elevated blood pressure reading without diagnosis of hypertension 06/12/2015  . Tubular adenoma of colon 04/27/2015  . Nonspecific abnormal results of thyroid function study 04/14/2009  . DEPRESSION 04/14/2009  . NONSPECIFIC ABNORMAL ELECTROCARDIOGRAM 04/14/2009    Current Outpatient Medications  Medication Sig Dispense Refill  . DULoxetine (CYMBALTA) 60 MG capsule Take 1 capsule (60 mg total) by mouth daily. 90 capsule 1  . estrogen, conjugated,-medroxyprogesterone (PREMPRO) 0.3-1.5 MG tablet Take 1 tablet by mouth daily.    Marland Kitchen losartan (COZAAR) 100 MG tablet Take 1 tablet by mouth daily.  0   No current facility-administered medications for this visit.     Allergies: Benzodiazepines; Other; Augmentin [amoxicillin-pot clavulanate]; Lamictal [lamotrigine]; and Penicillins  Past Medical History:  Diagnosis Date  . Anxiety   . Apocrine metaplasia of breast, right 03/03/2018  . Arthritis   . Breast mass, right 04/12/2017  . Cervical disc disease   . Complication of anesthesia    very sensitive to all mdications,needs lower dosage  . Depression   . DEPRESSION 04/14/2009   Qualifier: Diagnosis of  By: Linna Darner MD, Gwyndolyn Saxon    . Elevated blood pressure reading without diagnosis of hypertension 06/12/2015  . Hypertension   . Hypothyroidism 03/17/2018  . Lightheadedness 03/17/2018  . Menorrhagia 03/17/2018  . Neuromuscular disorder ([facility])    numbness in 2 fingers of left hand  . NONSPECIFIC ABNORMAL ELECTROCARDIOGRAM 04/14/2009   Qualifier: Diagnosis of  By: Linna Darner MD, Gwyndolyn Saxon    . Nonspecific abnormal results of thyroid function study 04/14/2009   Qualifier: Diagnosis of  By: Linna Darner MD, Gwyndolyn Saxon    . Perimenopausal disorder 03/17/2018  . Routine general medical examination at a health care facility 04/06/2016  . Thyroid disease    Right breast has discharge started when she had baby and has neve goneaway  . Tubular adenoma of colon 04/27/2015   04/07/2015 @ 70 cm ,polyp; Dr Collene Mares Due 2019     Past Surgical History:  Procedure Laterality Date  . APPENDECTOMY    . BLADDER REPAIR    . BREAST LUMPECTOMY WITH RADIOACTIVE SEED LOCALIZATION Right 02/10/2017   Procedure: RIGHT BREAST LUMPECTOMY WITH RADIOACTIVE SEED LOCALIZATION;  Surgeon: Autumn Messing III, MD;  Location: [facility];  Service: General;  Laterality: Right;  . CESAREAN SECTION     X 2, Dr Reino Kent  . EYE SURGERY Bilateral    Lasik   . TUBAL LIGATION     bladder laceration repair    Family History  Problem Relation Age of Onset  . Hypertension Mother   . Other Mother        congenital heart defect  . Diabetes Father   . Heart disease Father   . Hypertension Father   . Stroke Maternal Grandfather 72       dementia  . Cancer Paternal Grandfather        testicular  . Mitral valve prolapse Maternal Grandmother   . Heart disease  Paternal Grandmother   . Breast cancer Paternal Grandmother   . Other Sister        neurologic issue    Social History   Tobacco Use  . Smoking status: Never Smoker  . Smokeless tobacco: Never Used  Substance Use Topics  . Alcohol use: Yes    Comment: Rarely     Subjective:  Stephanie Gray is here today for evaluation of an acute complaint of hair loss, has noticed increased hair loss over past 2- 3 months, describes as "more hair coming out" during hairbrushing, states her hair "used to be thicker." worried this is related to her losartan, has  been on losartan for years for HTN. Denies use of new products or shampoos, patchy hair loss, pain, itching, rash. Says she did suffer from alopecia related to stress in the past, but hair loss was in patches then, now hair just "seems thinner." Her blood pressure is slightly elevated today, I met her this past March to establish care and we discussed her blood pressure, she was asked to return in 3 months for recheck but states she has had normal readings at home - 120s/80s, so never returned. She has continued losartan 100 daily as prescribed, also working on diet exercise, and weight loss.  BP Readings from Last 3 Encounters:  11/06/18 140/90  03/03/18 128/78  04/16/17 131/79    ROS-SEE HPI  Objective:  Vitals:   11/06/18 1429  BP: 140/90  Pulse: 91  SpO2: 98%  Weight: 134 lb (60.8 kg)  Height: 5' 2" (1.575 m)    General: Well developed, well nourished, in no acute distress  Skin : Warm and dry. No rash noted, no erythema. Hair in normal distribution. Head: Normocephalic and atraumatic  Eyes: Sclera and conjunctiva clear; pupils round and reactive to light; extraocular movements intact  Oropharynx: Pink, supple. No suspicious lesions  Neck: Supple without thyromegaly, adenopathy  Lungs: Respirations unlabored; clear to auscultation bilaterally without wheeze, rales, rhonchi  CVS exam: normal rate, regular rhythm, normal S1, S2, no murmurs, rubs, clicks or gallops.  Extremities: No edema, cyanosis Vessels: Symmetric bilaterally  Neurologic: Alert and oriented; speech intact; face symmetrical; moves all extremities well; CNII-XII intact without focal deficit  Psychiatric: Normal mood and affect.  Assessment:  1. Hair loss   2. Need for influenza vaccination   3. Essential hypertension   4. Encounter for hepatitis C virus screening test for high risk patient     Plan:   Return in about 6 months (around 05/07/2019) for CPE.  Orders Placed This Encounter  Procedures  . Flu  Vaccine QUAD 36+ mos IM  . TSH    Standing Status:   Future    Standing Expiration Date:   11/07/2019  . Iron    Standing Status:   Future    Standing Expiration Date:   11/06/2019  . Ferritin    Standing Status:   Future    Standing Expiration Date:   11/06/2019  . ANA    Standing Status:   Future    Standing Expiration Date:   11/06/2019  . Hepatitis C antibody    Standing Status:   Future    Standing Expiration Date:   12/06/2018    Requested Prescriptions    No prescriptions requested or ordered in this encounter    Health maintenance reviewed: PAP, Mammogram UTD by GYN provider per patient, she forgot gyn name but will call back with name so that we can request records to  update chart We discussed returning in 3-6 months for annual physical with lipid panel, she says this has recently been done by GYN, otherwise her routine health maintenance is up to date Need for influenza vaccination- Flu Vaccine QUAD 36+ mos IM Encounter for hepatitis C virus screening test for high risk patient- Hepatitis C antibody; Future  Hair loss I am not sure that this is related to her losartan Will check labs and F/U with further recommendations pending lab results - TSH; Future - Iron; Future - Ferritin; Future - ANA; Future

## 2018-11-06 NOTE — Assessment & Plan Note (Addendum)
Stable Continue current medication Continue to monitor BP readings at home, F/U for readings >140/90 Appears losartan is coming from one of her other providers, not currently prescribed by me

## 2018-11-08 LAB — ANA: Anti Nuclear Antibody(ANA): POSITIVE — AB

## 2018-11-08 LAB — HEPATITIS C ANTIBODY
HEP C AB: NONREACTIVE
SIGNAL TO CUT-OFF: 0.01 (ref <1.00)

## 2018-11-08 LAB — ANTI-NUCLEAR AB-TITER (ANA TITER)

## 2018-12-18 ENCOUNTER — Other Ambulatory Visit: Payer: Self-pay | Admitting: Physician Assistant

## 2018-12-18 DIAGNOSIS — S0992XA Unspecified injury of nose, initial encounter: Secondary | ICD-10-CM

## 2018-12-20 DIAGNOSIS — Z8616 Personal history of COVID-19: Secondary | ICD-10-CM

## 2018-12-20 HISTORY — DX: Personal history of COVID-19: Z86.16

## 2018-12-25 ENCOUNTER — Ambulatory Visit: Payer: 59 | Attending: Physician Assistant

## 2019-01-19 ENCOUNTER — Other Ambulatory Visit: Payer: Self-pay | Admitting: Nurse Practitioner

## 2019-03-22 ENCOUNTER — Other Ambulatory Visit: Payer: Self-pay

## 2019-03-22 ENCOUNTER — Ambulatory Visit (INDEPENDENT_AMBULATORY_CARE_PROVIDER_SITE_OTHER): Payer: 59 | Admitting: Psychiatry

## 2019-03-22 DIAGNOSIS — F419 Anxiety disorder, unspecified: Secondary | ICD-10-CM | POA: Diagnosis not present

## 2019-03-22 DIAGNOSIS — F339 Major depressive disorder, recurrent, unspecified: Secondary | ICD-10-CM

## 2019-03-22 DIAGNOSIS — F330 Major depressive disorder, recurrent, mild: Secondary | ICD-10-CM

## 2019-03-22 MED ORDER — DULOXETINE HCL 60 MG PO CPEP: 60.0000 mg | ORAL_CAPSULE | Freq: Every day | ORAL | 1 refills | Status: DC

## 2019-03-22 NOTE — Progress Notes (Signed)
Virtual Visit via Telephone Note  I connected with Stephanie Gray on 03/22/19 at  2:20 PM EDT by telephone and verified that I am speaking with the correct person using two identifiers.   I discussed the limitations, risks, security and privacy concerns of performing an evaluation and management service by telephone and the availability of in person appointments. I also discussed with the patient that there may be a patient responsible charge related to this service. The patient expressed understanding and agreed to proceed.   History of Present Illness: Patient was evaluated through phone session.  She is taking Cymbalta 60 mg daily.  She reported no side effects.  She was last seen by covering physician Dr. Mart Piggs 6 months ago.  There were no changes in her medication.  She feels her anxiety is much better.  She still have some time performance anxiety when she has to take exam or there are situations which she is not ready for it.  She has taken beta-blocker in the past which helped her but lately she has not been though situation due to pandemic coronavirus.  Her children's are doing home schooling.  Her husband is busy at work.  She was recommended to do sleep study but she did not feel it was needed because she feels rested after sleep.  She has snore during the sleep but she does not feel there is a need for that.  She is not sleeping during the day excessively.  She admitted if the symptoms started to get worse then she will consider sleep studies.  She started taking CBD tablets which helps her anxiety.  She is getting it from [place] where her mother lives.  She is also thinking about switching her antihypertensive to beta-blocker as propanol helps her anxiety and currently she is taking losartan.  She has not seen her primary care physician in a while.  She did not recall any side effects of Cymbalta.  She denies any mania or psychosis.  Her appetite is okay.  She reported no tremors shakes  or any EPS.  Past Psychiatric History: Reviewed. H/O depressionand anxiety. Started medication after her son was born. Took Paxil, Zoloft and propanolol for anxiety. Took Lamictal but developed rash. No h/o inpatient treatment or suicidal attempt. H/O episodic hypomanic-like symptoms as describe more energy and poor sleep. No h/o functional impairment. No h/o paranoia and hallucination. Saw Letta Moynahan for 10 years.    Observations/Objective: Limited mental status examination done on the phone.  Patient appears pleasant on the phone.  She describes her mood is good.  She reported no side effects of the medication including any tremors or shakes.  Her speech is clear, coherent with normal tone and volume.  Her attention and concentration appears to be intact.  There were no delusions, paranoia.  She denies any auditory or visual hallucination.  She denies any active or passive suicidal thoughts or homicidal thought.  She is alert and oriented x3.  Her fund of knowledge is adequate.  Her insight and judgment is good.  Her cognition is intact.  Assessment and Plan: Major depressive disorder, recurrent.  Anxiety.  Patient is a stable on 60 mg Cymbalta without any side effects.  She takes as needed propanolol when she has performance anxiety.  We discussed CBD tablets that it is not regulated and she is aware about it.  Patient will explore taking beta-blocker instead of losartan for her blood pressure.  We also reminded that if she has excessive  daytime sleep, snoring the night and feeling tired in the morning that she should consider sleep studies.  She agreed with the plan.  I also encouraged to keep appointment with primary care physician for annual physical checkup and blood work.  Recommended to call us back if is any question or any concern.  Follow-up in 6 months.  Continue Cymbalta 60 mg daily.  Follow Up Instructions:    I discussed the assessment and treatment plan with the patient.  The patient was provided an opportunity to ask questions and all were answered. The patient agreed with the plan and demonstrated an understanding of the instructions.   The patient was advised to call back or seek an in-person evaluation if the symptoms worsen or if the condition fails to improve as anticipated.  I provided 15 minutes of non-face-to-face time during this encounter.   Kathlee Nations, MD

## 2019-05-09 ENCOUNTER — Encounter: Payer: Self-pay | Admitting: Nurse Practitioner

## 2019-06-18 ENCOUNTER — Other Ambulatory Visit: Payer: Self-pay | Admitting: Family

## 2019-06-18 MED ORDER — LOSARTAN POTASSIUM 100 MG PO TABS: 100.0000 mg | ORAL_TABLET | Freq: Every day | ORAL | 0 refills | Status: DC

## 2019-06-21 ENCOUNTER — Telehealth: Payer: Self-pay | Admitting: Family

## 2019-06-21 NOTE — Telephone Encounter (Signed)
Would you take pt on as a TOC?

## 2019-06-25 NOTE — Telephone Encounter (Signed)
That's fine. thanks

## 2019-06-26 NOTE — Telephone Encounter (Signed)
CPE scheduled  

## 2019-07-06 ENCOUNTER — Telehealth: Payer: Self-pay | Admitting: Family

## 2019-07-06 NOTE — Telephone Encounter (Signed)
Patient is requesting order for CPE to be added so she can have labs done before appt.

## 2019-07-06 NOTE — Telephone Encounter (Signed)
Patient informed insurance may or may not cover labs prior to CPE. She will just wait for visit.

## 2019-07-09 ENCOUNTER — Encounter: Payer: 59 | Admitting: Family

## 2019-07-13 ENCOUNTER — Other Ambulatory Visit: Payer: Self-pay

## 2019-07-17 ENCOUNTER — Other Ambulatory Visit (HOSPITAL_COMMUNITY)
Admission: RE | Admit: 2019-07-17 | Discharge: 2019-07-17 | Disposition: A | Discharge status: Home / Self Care | Payer: 59 | Source: Ambulatory Visit | Attending: Obstetrics and Gynecology | Admitting: Obstetrics and Gynecology

## 2019-07-17 ENCOUNTER — Other Ambulatory Visit: Payer: Self-pay

## 2019-07-17 ENCOUNTER — Encounter: Payer: Self-pay | Admitting: Obstetrics and Gynecology

## 2019-07-17 ENCOUNTER — Ambulatory Visit: Payer: 59 | Admitting: Obstetrics and Gynecology

## 2019-07-17 VITALS — BP 126/80 | HR 76 | Temp 98.4°F | Ht 62.0 in | Wt 143.8 lb

## 2019-07-17 DIAGNOSIS — L309 Dermatitis, unspecified: Secondary | ICD-10-CM

## 2019-07-17 DIAGNOSIS — Z01419 Encounter for gynecological examination (general) (routine) without abnormal findings: Secondary | ICD-10-CM | POA: Diagnosis not present

## 2019-07-17 DIAGNOSIS — Z79890 Hormone replacement therapy (postmenopausal): Secondary | ICD-10-CM | POA: Diagnosis not present

## 2019-07-17 DIAGNOSIS — Z0000 Encounter for general adult medical examination without abnormal findings: Secondary | ICD-10-CM | POA: Diagnosis not present

## 2019-07-17 DIAGNOSIS — N763 Subacute and chronic vulvitis: Secondary | ICD-10-CM

## 2019-07-17 DIAGNOSIS — Z124 Encounter for screening for malignant neoplasm of cervix: Secondary | ICD-10-CM

## 2019-07-17 MED ORDER — BETAMETHASONE VALERATE 0.1 % EX OINT: 1.0000 "application " | TOPICAL_OINTMENT | Freq: Two times a day (BID) | CUTANEOUS | 0 refills | Status: DC

## 2019-07-17 MED ORDER — CONJ ESTROG-MEDROXYPROGEST ACE 0.3-1.5 MG PO TABS: 1.0000 | ORAL_TABLET | Freq: Every day | ORAL | 3 refills | Status: DC

## 2019-07-17 NOTE — Progress Notes (Signed)
53 y.o. [id number] Married White or Caucasian Not Hispanic or Latino female here for annual exam.  She is on low dose HRT, she is still having night sweats, but tolerable. Some vaginal dryness, helped with lubrication.  She wants to stay on HRT, has helped her vasomotor symptoms and depression, as well as concerns She has a family history of osteoporosis. She rides competitively and wants to keep her bones strong.     Patient's last menstrual period was 10/10/2013.          Sexually active: Yes.    The current method of family planning is tubal ligation.    Exercising: Yes.    riding horses, has horse farm Smoker:  no  Health Maintenance: Pap:  2019 WNL per patient History of abnormal Pap:  Yes, repeat pap was normal per patient MMG:  2019 WNL per patient, done in [place], breast biopsy done in 2018 with the [facility]: Never Colonoscopy: 2017, polyps, repeat in 5 years TDaP:  03/30/2017 Gardasil: No   reports that she has never smoked. She has never used smokeless tobacco. She reports current alcohol use. She reports that she does not use drugs. Extremely rare ETOH use. She has a horse farm. Kids are 25-21, 2 biological and 3 adopted from [place] (came to play sports in HS). All in college, graduate school. Betsy Coder has 2 more years. One son is on the Autism spectrum, goes to [employer]  Past Medical History:  Diagnosis Date  . Anxiety   . Apocrine metaplasia of breast, right 03/03/2018  . Arthritis   . Breast mass, right 04/12/2017  . Cervical disc disease   . Complication of anesthesia    very sensitive to all mdications,needs lower dosage  . Depression   . DEPRESSION 04/14/2009   Qualifier: Diagnosis of  By: Linna Darner MD, Gwyndolyn Saxon    . Elevated blood pressure reading without diagnosis of hypertension 06/12/2015  . Hypertension   . Hypothyroidism 03/17/2018  . Lightheadedness 03/17/2018  . Menorrhagia 03/17/2018  . Neuromuscular disorder ([facility])    numbness in 2 fingers  of left hand  . NONSPECIFIC ABNORMAL ELECTROCARDIOGRAM 04/14/2009   Qualifier: Diagnosis of  By: Linna Darner MD, Gwyndolyn Saxon    . Nonspecific abnormal results of thyroid function study 04/14/2009   Qualifier: Diagnosis of  By: Linna Darner MD, Gwyndolyn Saxon    . Perimenopausal disorder 03/17/2018  . Routine general medical examination at a health care facility 04/06/2016  . Thyroid disease    Right breast has discharge started when she had baby and has neve goneaway  . Tubular adenoma of colon 04/27/2015   04/07/2015 @ 70 cm ,polyp; Dr Collene Mares Due 2019     Past Surgical History:  Procedure Laterality Date  . APPENDECTOMY    . BLADDER REPAIR    . BREAST LUMPECTOMY WITH RADIOACTIVE SEED LOCALIZATION Right 02/10/2017   Procedure: RIGHT BREAST LUMPECTOMY WITH RADIOACTIVE SEED LOCALIZATION;  Surgeon: Autumn Messing III, MD;  Location: [facility];  Service: General;  Laterality: Right;  . CESAREAN SECTION     X 2, Dr Reino Kent  . EYE SURGERY Bilateral    Lasik   . TUBAL LIGATION     bladder laceration repair    Current Outpatient Medications  Medication Sig Dispense Refill  . DULoxetine (CYMBALTA) 60 MG capsule Take 1 capsule (60 mg total) by mouth daily. 90 capsule 1  . estrogen, conjugated,-medroxyprogesterone (PREMPRO) 0.3-1.5 MG tablet Take 1 tablet by mouth daily.    Marland Kitchen losartan (COZAAR) 100 MG  tablet Take 1 tablet (100 mg total) by mouth daily. 90 tablet 0  . UNABLE TO FIND Raw hemp and [place] 10 mg one daily     No current facility-administered medications for this visit.     Family History  Problem Relation Age of Onset  . Hypertension Mother   . Other Mother        congenital heart defect  . Diabetes Father   . Heart disease Father   . Hypertension Father   . Stroke Maternal Grandfather 72       dementia  . Cancer Paternal Grandfather        testicular  . Mitral valve prolapse Maternal Grandmother   . Heart disease Paternal Grandmother   . Breast cancer Paternal Grandmother   . Other Sister         neurologic issue    Review of Systems  Constitutional: Negative.   HENT: Negative.   Eyes: Negative.   Respiratory: Negative.   Cardiovascular: Negative.   Gastrointestinal: Negative.   Endocrine: Negative.   Genitourinary: Negative.   Musculoskeletal: Negative.   Skin: Negative.   Allergic/Immunologic: Negative.   Neurological: Negative.   Hematological: Negative.   Psychiatric/Behavioral: Negative.     Exam:   BP 126/80 (BP Location: Right Arm, Patient Position: Sitting, Cuff Size: Normal)   Pulse 76   Temp 98.4 F (36.9 C) (Skin)   Ht 5\' 2"  (1.575 m)   Wt 143 lb 12.8 oz (65.2 kg)   LMP 10/10/2013   BMI 26.30 kg/m   Weight change: @WEIGHTCHANGE @ Height:   Height: 5\' 2"  (157.5 cm)  Ht Readings from Last 3 Encounters:  07/17/19 5\' 2"  (1.575 m)  11/06/18 5\' 2"  (1.575 m)  03/03/18 5\' 2"  (1.575 m)    General appearance: alert, cooperative and appears stated age Head: Normocephalic, without obvious abnormality, atraumatic Neck: no adenopathy, supple, symmetrical, trachea midline and thyroid normal to inspection and palpation Lungs: clear to auscultation bilaterally Cardiovascular: regular rate and rhythm Breasts: normal appearance, no masses or tenderness Abdomen: soft, non-tender; non distended,  no masses,  no organomegaly Extremities: extremities normal, atraumatic, no cyanosis or edema Skin: Skin color, texture, turgor normal. No rashes or lesions Lymph nodes: Cervical, supraclavicular, and axillary nodes normal. No abnormal inguinal nodes palpated Neurologic: Grossly normal   Pelvic: External genitalia:  no lesions, some erythema and mild whitening              Urethra:  normal appearing urethra with no masses, tenderness or lesions              Bartholins and Skenes: normal                 Vagina: normal appearing vagina with normal color and discharge, no lesions              Cervix: no lesions               Bimanual Exam:  Uterus:  normal size, contour,  position, consistency, mobility, non-tender              Adnexa: no mass, fullness, tenderness               Rectovaginal: Confirms               Anus:  normal sphincter tone, no lesions  Perianal region: whitening, erythema, fissures  On questioning the patient reports chronic, long term issues with vulvar and perianal irritation and itching. 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 She uses A&D ointment.   Chaperone was present for exam.  A:  Well Woman with normal exam  HRT, doing well, wants to continue. Aware of risks  Chronic vulvitis, perianal dermatitis (chronically sweating working outside with horses)  P:   Pap with hpv  Mammogram in the fall  Continue HRT  Colonoscopy UTD  Discussed breast self exam  Discussed calcium and vit D intake  Discussed vulvar skin care  Steroid ointment given

## 2019-07-17 NOTE — Patient Instructions (Signed)

## 2019-07-18 ENCOUNTER — Telehealth: Payer: Self-pay

## 2019-07-18 LAB — VAGINITIS/VAGINOSIS, DNA PROBE
Candida Species: NEGATIVE
Gardnerella vaginalis: NEGATIVE
Trichomonas vaginosis: NEGATIVE

## 2019-07-18 NOTE — Telephone Encounter (Signed)
Spoke with patient. Advised of results as seen below. Patient verbalizes understanding. Encounter closed.

## 2019-07-18 NOTE — Telephone Encounter (Signed)
Patient is returning a call to [facility].

## 2019-07-18 NOTE — Telephone Encounter (Signed)
-----   Message from Salvadore Dom, MD sent at 07/18/2019 11:47 AM EDT ----- Please inform the patient that her vaginitis panel is negative and to call if she isn't feeling better in the next 2 weeks.

## 2019-07-18 NOTE — Telephone Encounter (Signed)
Attempted to reach patient at number provided, Voicemail box is full.

## 2019-07-19 LAB — CYTOLOGY - PAP
Diagnosis: NEGATIVE
HPV: NOT DETECTED

## 2019-07-23 ENCOUNTER — Encounter: Payer: Self-pay | Admitting: Family

## 2019-07-23 ENCOUNTER — Ambulatory Visit (INDEPENDENT_AMBULATORY_CARE_PROVIDER_SITE_OTHER)
Admission: RE | Admit: 2019-07-23 | Discharge: 2019-07-23 | Disposition: A | Discharge status: Home / Self Care | Payer: 59 | Source: Ambulatory Visit | Attending: Family | Admitting: Family

## 2019-07-23 ENCOUNTER — Other Ambulatory Visit: Payer: Self-pay

## 2019-07-23 ENCOUNTER — Ambulatory Visit (INDEPENDENT_AMBULATORY_CARE_PROVIDER_SITE_OTHER): Payer: 59 | Admitting: Family

## 2019-07-23 ENCOUNTER — Other Ambulatory Visit (INDEPENDENT_AMBULATORY_CARE_PROVIDER_SITE_OTHER): Payer: 59

## 2019-07-23 VITALS — BP 118/78 | HR 87 | Temp 98.5°F | Ht 62.0 in | Wt 141.2 lb

## 2019-07-23 DIAGNOSIS — I10 Essential (primary) hypertension: Secondary | ICD-10-CM | POA: Diagnosis not present

## 2019-07-23 DIAGNOSIS — Z0000 Encounter for general adult medical examination without abnormal findings: Secondary | ICD-10-CM

## 2019-07-23 DIAGNOSIS — Z13220 Encounter for screening for lipoid disorders: Secondary | ICD-10-CM

## 2019-07-23 DIAGNOSIS — E2839 Other primary ovarian failure: Secondary | ICD-10-CM

## 2019-07-23 DIAGNOSIS — F419 Anxiety disorder, unspecified: Secondary | ICD-10-CM | POA: Diagnosis not present

## 2019-07-23 LAB — COMPREHENSIVE METABOLIC PANEL
ALT: 15 U/L (ref 0–35)
AST: 15 U/L (ref 0–37)
Albumin: 4.4 g/dL (ref 3.5–5.2)
Alkaline Phosphatase: 69 U/L (ref 39–117)
BUN: 15 mg/dL (ref 6–23)
CO2: 27 mEq/L (ref 19–32)
Calcium: 9.3 mg/dL (ref 8.4–10.5)
Chloride: 103 mEq/L (ref 96–112)
Creatinine, Ser: 0.75 mg/dL (ref 0.40–1.20)
GFR: 80.34 mL/min (ref >60.00)
Glucose, Bld: 97 mg/dL (ref 70–99)
Potassium: 3.9 mEq/L (ref 3.5–5.1)
Sodium: 140 mEq/L (ref 135–145)
Total Bilirubin: 0.3 mg/dL (ref 0.2–1.2)
Total Protein: 7 g/dL (ref 6.0–8.3)

## 2019-07-23 LAB — CBC WITH DIFFERENTIAL/PLATELET
Basophils Absolute: 0.1 10*3/uL (ref 0.0–0.1)
Basophils Relative: 0.8 % (ref 0.0–3.0)
Eosinophils Absolute: 0.2 10*3/uL (ref 0.0–0.7)
Eosinophils Relative: 3.4 % (ref 0.0–5.0)
HCT: 39.5 % (ref 36.0–46.0)
Hemoglobin: 13.4 g/dL (ref 12.0–15.0)
Lymphocytes Relative: 28.9 % (ref 12.0–46.0)
Lymphs Abs: 1.9 10*3/uL (ref 0.7–4.0)
MCHC: 34 g/dL (ref 30.0–36.0)
MCV: 86.9 fl (ref 78.0–100.0)
Monocytes Absolute: 0.5 10*3/uL (ref 0.1–1.0)
Monocytes Relative: 7.8 % (ref 3.0–12.0)
Neutro Abs: 4 10*3/uL (ref 1.4–7.7)
Neutrophils Relative %: 59.1 % (ref 43.0–77.0)
Platelets: 242 10*3/uL (ref 150.0–400.0)
RBC: 4.54 Mil/uL (ref 3.87–5.11)
RDW: 12.9 % (ref 11.5–15.5)
WBC: 6.7 10*3/uL (ref 4.0–10.5)

## 2019-07-23 LAB — LIPID PANEL
Cholesterol: 165 mg/dL (ref 0–200)
HDL: 63.6 mg/dL (ref >39.00)
LDL Cholesterol: 88 mg/dL (ref 0–99)
NonHDL: 101.49
Total CHOL/HDL Ratio: 3
Triglycerides: 65 mg/dL (ref 0.0–149.0)
VLDL: 13 mg/dL (ref 0.0–40.0)

## 2019-07-23 LAB — VITAMIN D 25 HYDROXY (VIT D DEFICIENCY, FRACTURES): VITD: 22.68 ng/mL — ABNORMAL LOW (ref 30.00–100.00)

## 2019-07-23 LAB — TSH: TSH: 3.03 u[IU]/mL (ref 0.35–4.50)

## 2019-07-23 MED ORDER — LOSARTAN POTASSIUM 100 MG PO TABS: 100.0000 mg | ORAL_TABLET | Freq: Every day | ORAL | 3 refills | Status: DC

## 2019-07-23 NOTE — Progress Notes (Signed)
Stephanie Gray is a 53 y.o. female with the following history as recorded in [employer]:  Patient Active Problem List   Diagnosis Date Noted  . Hypothyroidism 03/17/2018  . Menorrhagia 03/17/2018  . Perimenopausal disorder 03/17/2018  . Lightheadedness 03/17/2018  . Neuromuscular disorder ([place])   . Depression   . Arthritis   . Anxiety   . Apocrine metaplasia of breast, right 03/03/2018  . Breast mass, right 04/12/2017  . Hypertension 06/28/2016  . Routine general medical examination at a health care facility 04/06/2016  . Elevated blood pressure reading without diagnosis of hypertension 06/12/2015  . Tubular adenoma of colon 04/27/2015  . Nonspecific abnormal results of thyroid function study 04/14/2009  . DEPRESSION 04/14/2009  . NONSPECIFIC ABNORMAL ELECTROCARDIOGRAM 04/14/2009    Current Outpatient Medications  Medication Sig Dispense Refill  . betamethasone valerate ointment (VALISONE) 0.1 % Apply 1 application topically 2 (two) times daily. Can use for up to 2 weeks, not for daily long term use. 30 g 0  . DULoxetine (CYMBALTA) 60 MG capsule Take 1 capsule (60 mg total) by mouth daily. 90 capsule 1  . estrogen, conjugated,-medroxyprogesterone (PREMPRO) 0.3-1.5 MG tablet Take 1 tablet by mouth daily. 90 tablet 3  . losartan (COZAAR) 100 MG tablet Take 1 tablet (100 mg total) by mouth daily. 90 tablet 3  . UNABLE TO FIND Raw hemp and [place] 10 mg one daily     No current facility-administered medications for this visit.     Allergies: Benzodiazepines, Other, Augmentin [amoxicillin-pot clavulanate], Lamictal [lamotrigine], and Penicillins  Past Medical History:  Diagnosis Date  . Anxiety   . Apocrine metaplasia of breast, right 03/03/2018  . Arthritis   . Breast mass, right 04/12/2017  . Cervical disc disease   . Complication of anesthesia    very sensitive to all mdications,needs lower dosage  . Depression   . DEPRESSION 04/14/2009   Qualifier: Diagnosis of  By:  Linna Darner MD, Gwyndolyn Saxon    . Hypertension   . Hypothyroidism 03/17/2018  . Lightheadedness 03/17/2018  . Neuromuscular disorder (HCC)    numbness in 2 fingers of left hand  . NONSPECIFIC ABNORMAL ELECTROCARDIOGRAM 04/14/2009   Qualifier: Diagnosis of  By: Linna Darner MD, Gwyndolyn Saxon    . Nonspecific abnormal results of thyroid function study 04/14/2009   Qualifier: Diagnosis of  By: Linna Darner MD, Gwyndolyn Saxon    . Perimenopausal disorder 03/17/2018  . Routine general medical examination at a health care facility 04/06/2016  . Tubular adenoma of colon 04/27/2015   04/07/2015 @ 70 cm ,polyp; Dr Collene Mares Due 2019     Past Surgical History:  Procedure Laterality Date  . APPENDECTOMY    . BLADDER REPAIR    . BREAST LUMPECTOMY WITH RADIOACTIVE SEED LOCALIZATION Right 02/10/2017   Procedure: RIGHT BREAST LUMPECTOMY WITH RADIOACTIVE SEED LOCALIZATION;  Surgeon: Autumn Messing III, MD;  Location: [facility];  Service: General;  Laterality: Right;  . CESAREAN SECTION     X 2, Dr Reino Kent  . EYE SURGERY Bilateral    Lasik   . TUBAL LIGATION     bladder laceration repair    Family History  Problem Relation Age of Onset  . Hypertension Mother   . Other Mother        congenital heart defect  . Diabetes Father   . Heart disease Father   . Hypertension Father   . Stroke Maternal Grandfather 72       dementia  . Cancer Paternal Grandfather  testicular  . Mitral valve prolapse Maternal Grandmother   . Heart disease Paternal Grandmother   . Breast cancer Paternal Grandmother   . Other Sister        neurologic issue    Social History   Tobacco Use  . Smoking status: Never Smoker  . Smokeless tobacco: Never Used  Substance Use Topics  . Alcohol use: Yes    Comment: Rarely    Subjective:  Patient presents to follow-up on hypertension; has been using OTC Hemp and noticing good improvement in anxiety; doing well on combination of Losartan and Cymbalta; needs labs and medications refilled;  Had CPE with GYN last week; will be  getting mammogram updated; up to date on colonoscopy; would be agreeable to updating DEXA;    Objective:  Vitals:   07/23/19 1120  BP: 118/78  Pulse: 87  Temp: 98.5 F (36.9 C)  TempSrc: Oral  SpO2: 99%  Weight: 141 lb 3.2 oz (64 kg)  Height: 5' 2"  (1.575 m)    General: Well developed, well nourished, in no acute distress  Skin : Warm and dry.  Head: Normocephalic and atraumatic  Eyes: Sclera and conjunctiva clear; pupils round and reactive to light; extraocular movements intact  Ears: External normal; canals clear; tympanic membranes normal  Oropharynx: Pink, supple. No suspicious lesions  Neck: Supple without thyromegaly, adenopathy  Lungs: Respirations unlabored; clear to auscultation bilaterally without wheeze, rales, rhonchi  CVS exam: normal rate and regular rhythm.  Neurologic: Alert and oriented; speech intact; face symmetrical; moves all extremities well; CNII-XII intact without focal deficit   Assessment:  1. PE (physical exam), annual   2. Lipid screening   3. Ovarian failure   4. Essential hypertension   5. Anxiety     Plan:  Labs and refills updated; mammogram is scheduled; yearly CPE was just done by GYN last week; keep planned follow-up with psychiatrist; DEXA updated today; follow-up in 1 year, sooner prn.   No follow-ups on file.  Orders Placed This Encounter  Procedures  . DG Bone Density    Standing Status:   Future    Number of Occurrences:   1    Standing Expiration Date:   09/21/2020    Order Specific Question:   Reason for Exam (SYMPTOM  OR DIAGNOSIS REQUIRED)    Answer:   ovarian failure    Order Specific Question:   Is the patient pregnant?    Answer:   No    Order Specific Question:   Preferred imaging location?    Answer:   [facility]  . CBC w/Diff    Standing Status:   Future    Number of Occurrences:   1    Standing Expiration Date:   07/22/2020  . Comp Met (CMET)    Standing Status:   Future    Number of Occurrences:   1     Standing Expiration Date:   07/22/2020  . Lipid panel    Standing Status:   Future    Number of Occurrences:   1    Standing Expiration Date:   07/22/2020  . TSH    Standing Status:   Future    Number of Occurrences:   1    Standing Expiration Date:   07/22/2020  . Vitamin D (25 hydroxy)    Standing Status:   Future    Number of Occurrences:   1    Standing Expiration Date:   07/22/2020    Requested Prescriptions  Signed Prescriptions Disp Refills  . losartan (COZAAR) 100 MG tablet 90 tablet 3    Sig: Take 1 tablet (100 mg total) by mouth daily.

## 2019-07-24 ENCOUNTER — Other Ambulatory Visit: Payer: Self-pay | Admitting: Family

## 2019-07-24 MED ORDER — VITAMIN D (ERGOCALCIFEROL) 1.25 MG (50000 UNIT) PO CAPS: 50000.0000 [IU] | ORAL_CAPSULE | ORAL | 0 refills | Status: AC

## 2019-09-24 ENCOUNTER — Encounter (HOSPITAL_COMMUNITY): Payer: Self-pay | Admitting: Psychiatry

## 2019-09-24 ENCOUNTER — Other Ambulatory Visit: Payer: Self-pay

## 2019-09-24 ENCOUNTER — Ambulatory Visit (INDEPENDENT_AMBULATORY_CARE_PROVIDER_SITE_OTHER): Payer: 59 | Admitting: Psychiatry

## 2019-09-24 DIAGNOSIS — F419 Anxiety disorder, unspecified: Secondary | ICD-10-CM | POA: Diagnosis not present

## 2019-09-24 DIAGNOSIS — F330 Major depressive disorder, recurrent, mild: Secondary | ICD-10-CM | POA: Diagnosis not present

## 2019-09-24 MED ORDER — DULOXETINE HCL 60 MG PO CPEP: 60.0000 mg | ORAL_CAPSULE | Freq: Every day | ORAL | 1 refills | Status: DC

## 2019-09-24 NOTE — Progress Notes (Addendum)
Virtual Visit via Telephone Note  I connected with Stephanie Gray on 09/24/19 at  9:00 AM EDT by telephone and verified that I am speaking with the correct person using two identifiers.   I discussed the limitations, risks, security and privacy concerns of performing an evaluation and management service by telephone and the availability of in person appointments. I also discussed with the patient that there may be a patient responsible charge related to this service. The patient expressed understanding and agreed to proceed.   History of Present Illness: Patient was evaluated by phone session.  She is taking Cymbalta 60 mg and reported no side effects.  Due to COVID she has been not in a situation when there is a chronic place and she feels her social anxiety is much better.  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  She feels it gives her a lot of laxation.  She is taking half CBD tablet which helps her anxiety and sleep.  She noticed her blood pressure is much better and now her physician had stopped blood pressure medicine.  She is sleeping good.  She denies any crying spells or any feeling of hopelessness and worthlessness.  She has adopted children who are currently in the college.  Her husband is very supportive.  She has no side effects from Cymbalta and she like to continue the current dose.  Recently she had a blood work and she has low vitamin D.  She has not taken any beta-blocker propanolol which she used to take in the past for social anxiety.  Her energy level is good.  She wants to continue Cymbalta.  Past Psychiatric History:Reviewed. H/O depressionand anxiety. Started medication after her son was born. Took Paxil, Zoloft and propanolol for anxiety. Took Lamictal but developed rash. No h/o inpatient treatment or suicidal attempt. H/O episodic hypomanic-like symptoms as describe more energy and poor sleep. No h/o functional impairment. No h/o paranoia and hallucination. Saw Letta Moynahan for 10 years.  Recent Results (from the past 2160 hour(s))  Cytology - PAP     Status: None   Collection Time: 07/17/19 12:00 AM  Result Value Ref Range   Adequacy      Satisfactory for evaluation  endocervical/transformation zone component PRESENT.   Diagnosis      NEGATIVE FOR INTRAEPITHELIAL LESIONS OR MALIGNANCY.   HPV NOT Detected     Comment: Normal Reference Range - NOT Detected   Material Submitted CervicoVaginal Pap [ThinPrep Imaged]   Vaginitis/Vaginosis, DNA Probe     Status: None   Collection Time: 07/17/19  2:02 PM   Specimen: Genital   GE  Result Value Ref Range   Candida Species Negative Negative   Gardnerella vaginalis Negative Negative   Trichomonas vaginosis Negative Negative  Vitamin D (25 hydroxy)     Status: Abnormal   Collection Time: 07/23/19 12:15 PM  Result Value Ref Range   VITD 22.68 (L) 30.00 - 100.00 ng/mL  TSH     Status: None   Collection Time: 07/23/19 12:15 PM  Result Value Ref Range   TSH 3.03 0.35 - 4.50 uIU/mL  Lipid panel     Status: None   Collection Time: 07/23/19 12:15 PM  Result Value Ref Range   Cholesterol 165 0 - 200 mg/dL    Comment: ATP III Classification       Desirable:  < 200 mg/dL               Borderline High:  200 -  239 mg/dL          High:  > = 240 mg/dL   Triglycerides 65.0 0.0 - 149.0 mg/dL    Comment: Normal:  <150 mg/dLBorderline High:  150 - 199 mg/dL   HDL 63.60 >39.00 mg/dL   VLDL 13.0 0.0 - 40.0 mg/dL   LDL Cholesterol 88 0 - 99 mg/dL   Total CHOL/HDL Ratio 3     Comment:                Men          Women1/2 Average Risk     3.4          3.3Average Risk          5.0          4.42X Average Risk          9.6          7.13X Average Risk          15.0          11.0                       NonHDL 101.49     Comment: NOTE:  Non-HDL goal should be 30 mg/dL higher than patient's LDL goal (i.e. LDL goal of < 70 mg/dL, would have non-HDL goal of < 100 mg/dL)  Comp Met (CMET)     Status: None   Collection Time:  07/23/19 12:15 PM  Result Value Ref Range   Sodium 140 135 - 145 mEq/L   Potassium 3.9 3.5 - 5.1 mEq/L   Chloride 103 96 - 112 mEq/L   CO2 27 19 - 32 mEq/L   Glucose, Bld 97 70 - 99 mg/dL   BUN 15 6 - 23 mg/dL   Creatinine, Ser 0.75 0.40 - 1.20 mg/dL   Total Bilirubin 0.3 0.2 - 1.2 mg/dL   Alkaline Phosphatase 69 39 - 117 U/L   AST 15 0 - 37 U/L   ALT 15 0 - 35 U/L   Total Protein 7.0 6.0 - 8.3 g/dL   Albumin 4.4 3.5 - 5.2 g/dL   Calcium 9.3 8.4 - 10.5 mg/dL   GFR 80.34 >60.00 mL/min  CBC w/Diff     Status: None   Collection Time: 07/23/19 12:15 PM  Result Value Ref Range   WBC 6.7 4.0 - 10.5 K/uL   RBC 4.54 3.87 - 5.11 Mil/uL   Hemoglobin 13.4 12.0 - 15.0 g/dL   HCT 39.5 36.0 - 46.0 %   MCV 86.9 78.0 - 100.0 fl   MCHC 34.0 30.0 - 36.0 g/dL   RDW 12.9 11.5 - 15.5 %   Platelets 242.0 150.0 - 400.0 K/uL   Neutrophils Relative % 59.1 43.0 - 77.0 %   Lymphocytes Relative 28.9 12.0 - 46.0 %   Monocytes Relative 7.8 3.0 - 12.0 %   Eosinophils Relative 3.4 0.0 - 5.0 %   Basophils Relative 0.8 0.0 - 3.0 %   Neutro Abs 4.0 1.4 - 7.7 K/uL   Lymphs Abs 1.9 0.7 - 4.0 K/uL   Monocytes Absolute 0.5 0.1 - 1.0 K/uL   Eosinophils Absolute 0.2 0.0 - 0.7 K/uL   Basophils Absolute 0.1 0.0 - 0.1 K/uL       Psychiatric Specialty Exam: Physical Exam  ROS  Last menstrual period 10/10/2013.There is no height or weight on file to calculate BMI.  General Appearance: NA  Eye Contact:  NA  Speech:  Clear and Coherent  Volume:  Normal  Mood:  Euthymic  Affect:  NA  Thought Process:  Goal Directed  Orientation:  Full (Time, Place, and Person)  Thought Content:  WDL and Logical  Suicidal Thoughts:  No  Homicidal Thoughts:  No  Memory:  Immediate;   Good Recent;   Good Remote;   Good  Judgement:  Good  Insight:  Good  Psychomotor Activity:  NA  Concentration:  Concentration: Good and Attention Span: Good  Recall:  Good  Fund of Knowledge:  Good  Language:  Good  Akathisia:  No   Handed:  Right  AIMS (if indicated):     Assets:  Communication Skills Desire for Improvement Housing Resilience Social Support Talents/Skills  ADL's:  Intact  Cognition:  WNL  Sleep:   good      Assessment and Plan: Social anxiety.  Major depressive disorder, recurrent.  Patient doing very well on current dose of Cymbalta 60 mg daily.  She has no tremors shakes or any EPS.  She is taking CBD tablet which is not regulated but patient is aware about it and she feels it is helping her sleep and she was able to come off from blood pressure medication since her blood pressure is normal.  She recently has blood work and she has low vitamin D.  Discussed medication side effects and benefits.  Continue Cymbalta 60 mg daily.  Recommended to call us back if she has any question of any concern.  She does not feel that she needs therapy.  Follow-up in 6 months.  Follow Up Instructions:    I discussed the assessment and treatment plan with the patient. The patient was provided an opportunity to ask questions and all were answered. The patient agreed with the plan and demonstrated an understanding of the instructions.   The patient was advised to call back or seek an in-person evaluation if the symptoms worsen or if the condition fails to improve as anticipated.  I provided 20 minutes of non-face-to-face time during this encounter.   Kathlee Nations, MD

## 2019-10-16 ENCOUNTER — Other Ambulatory Visit: Payer: Self-pay

## 2019-10-16 ENCOUNTER — Ambulatory Visit (HOSPITAL_COMMUNITY)
Admission: EM | Admit: 2019-10-16 | Discharge: 2019-10-16 | Disposition: A | Discharge status: Home / Self Care | Payer: 59 | Attending: Family Medicine | Admitting: Family Medicine

## 2019-10-16 ENCOUNTER — Encounter (HOSPITAL_COMMUNITY): Payer: Self-pay

## 2019-10-16 DIAGNOSIS — I10 Essential (primary) hypertension: Secondary | ICD-10-CM | POA: Diagnosis not present

## 2019-10-16 DIAGNOSIS — Z20822 Contact with and (suspected) exposure to covid-19: Secondary | ICD-10-CM

## 2019-10-16 DIAGNOSIS — R05 Cough: Secondary | ICD-10-CM

## 2019-10-16 DIAGNOSIS — Z20828 Contact with and (suspected) exposure to other viral communicable diseases: Secondary | ICD-10-CM | POA: Diagnosis present

## 2019-10-16 DIAGNOSIS — R768 Other specified abnormal immunological findings in serum: Secondary | ICD-10-CM

## 2019-10-16 DIAGNOSIS — R0602 Shortness of breath: Secondary | ICD-10-CM

## 2019-10-16 DIAGNOSIS — G9331 Postviral fatigue syndrome: Secondary | ICD-10-CM

## 2019-10-16 DIAGNOSIS — G933 Postviral fatigue syndrome: Secondary | ICD-10-CM | POA: Diagnosis not present

## 2019-10-16 NOTE — ED Triage Notes (Addendum)
Pt states she has been fatigue. Pt states she has SOB x 1 week. Pt has had a Covid test It was negative. Pt states she stopped her b/p med because she was using Hemp for pain. Pts b/p has spiked back up.

## 2019-10-16 NOTE — ED Provider Notes (Signed)
[place]    CSN: [id number] Arrival date & time: 10/16/19  1724      History   Chief Complaint Chief Complaint  Patient presents with  . Appointment    1730  . Fatigue    HPI Stephanie Gray is a 53 y.o. female.   HPI  Patient states that she was very sick for a week.  She states that she had a viral illness with fever body aches weakness cough and shortness of breath.  After staying home and trying to stay away from her family, she went to [facility] for a Covid test.  It was negative.  She is here because she still has coughing.  Fatigue.  Shortness of breath.  When she lays down at night she feels like her heartbeat is irregular.  If she does not have Covid, she is uncertain why she continues to feel this bad I did review her recent blood work.  She had blood work done in August that was pretty comprehensive.  She had an ANA that was positive with a homogeneous pattern and a high titer.  This was never reported to her.  I told her it is possible that she has a connective tissue disease.  This would compound a viral illness for coronavirus and make her fatigue more pronounced.  She needs to call her family doctor for advice on how to get seen by a rheumatologist.  Past Medical History:  Diagnosis Date  . Anxiety   . Apocrine metaplasia of breast, right 03/03/2018  . Arthritis   . Breast mass, right 04/12/2017  . Cervical disc disease   . Complication of anesthesia    very sensitive to all mdications,needs lower dosage  . Depression   . DEPRESSION 04/14/2009   Qualifier: Diagnosis of  By: Linna Darner MD, Gwyndolyn Saxon    . Hypertension   . Hypothyroidism 03/17/2018  . Lightheadedness 03/17/2018  . Neuromuscular disorder (HCC)    numbness in 2 fingers of left hand  . NONSPECIFIC ABNORMAL ELECTROCARDIOGRAM 04/14/2009   Qualifier: Diagnosis of  By: Linna Darner MD, Gwyndolyn Saxon    . Nonspecific abnormal results of thyroid function study 04/14/2009   Qualifier: Diagnosis of  By: Linna Darner MD,  Gwyndolyn Saxon    . Perimenopausal disorder 03/17/2018  . Routine general medical examination at a health care facility 04/06/2016  . Tubular adenoma of colon 04/27/2015   04/07/2015 @ 70 cm ,polyp; Dr Collene Mares Due 2019     Patient Active Problem List   Diagnosis Date Noted  . Hypothyroidism 03/17/2018  . Menorrhagia 03/17/2018  . Perimenopausal disorder 03/17/2018  . Lightheadedness 03/17/2018  . Neuromuscular disorder ([place])   . Depression   . Arthritis   . Anxiety   . Apocrine metaplasia of breast, right 03/03/2018  . Breast mass, right 04/12/2017  . Hypertension 06/28/2016  . Routine general medical examination at a health care facility 04/06/2016  . Elevated blood pressure reading without diagnosis of hypertension 06/12/2015  . Tubular adenoma of colon 04/27/2015  . Nonspecific abnormal results of thyroid function study 04/14/2009  . DEPRESSION 04/14/2009  . NONSPECIFIC ABNORMAL ELECTROCARDIOGRAM 04/14/2009    Past Surgical History:  Procedure Laterality Date  . APPENDECTOMY    . BLADDER REPAIR    . BREAST LUMPECTOMY WITH RADIOACTIVE SEED LOCALIZATION Right 02/10/2017   Procedure: RIGHT BREAST LUMPECTOMY WITH RADIOACTIVE SEED LOCALIZATION;  Surgeon: Autumn Messing III, MD;  Location: [facility];  Service: General;  Laterality: Right;  . CESAREAN SECTION     X  2, Dr Reino Kent  . EYE SURGERY Bilateral    Lasik   . TUBAL LIGATION     bladder laceration repair    OB History    Gravida  2   Para  2   Term  2   Preterm      AB      Living  2     SAB      TAB      Ectopic      Multiple      Live Births  2            Home Medications    Prior to Admission medications   Medication Sig Start Date End Date Taking? Authorizing Provider  betamethasone valerate ointment (VALISONE) 0.1 % Apply 1 application topically 2 (two) times daily. Can use for up to 2 weeks, not for daily long term use. 07/17/19   Salvadore Dom, MD  DULoxetine (CYMBALTA) 60 MG capsule Take 1 capsule (60  mg total) by mouth daily. 09/24/19   Arfeen, Arlyce Harman, MD  estrogen, conjugated,-medroxyprogesterone (PREMPRO) 0.3-1.5 MG tablet Take 1 tablet by mouth daily. 07/17/19   Salvadore Dom, MD  losartan (COZAAR) 100 MG tablet Take 1 tablet (100 mg total) by mouth daily. Patient not taking: Reported on 09/24/2019 07/23/19   Marrian Salvage, FNP    Family History Family History  Problem Relation Age of Onset  . Hypertension Mother   . Other Mother        congenital heart defect  . Diabetes Father   . Heart disease Father   . Hypertension Father   . Stroke Maternal Grandfather 72       dementia  . Cancer Paternal Grandfather        testicular  . Mitral valve prolapse Maternal Grandmother   . Heart disease Paternal Grandmother   . Breast cancer Paternal Grandmother   . Other Sister        neurologic issue    Social History Social History   Tobacco Use  . Smoking status: Never Smoker  . Smokeless tobacco: Never Used  Substance Use Topics  . Alcohol use: Yes    Comment: Rarely  . Drug use: No     Allergies   Benzodiazepines, Other, Augmentin [amoxicillin-pot clavulanate], Lamictal [lamotrigine], and Penicillins   Review of Systems Review of Systems  Constitutional: Positive for chills, fatigue and fever.  HENT: Negative for ear pain and sore throat.   Eyes: Negative for pain and visual disturbance.  Respiratory: Positive for cough and shortness of breath.   Cardiovascular: Positive for palpitations. Negative for chest pain.  Gastrointestinal: Negative for abdominal pain and vomiting.  Genitourinary: Negative for dysuria and hematuria.  Musculoskeletal: Positive for myalgias. Negative for arthralgias and back pain.  Skin: Negative for color change and rash.  Neurological: Positive for headaches. Negative for seizures and syncope.  All other systems reviewed and are negative.    Physical Exam Triage Vital Signs ED Triage Vitals  Enc Vitals Group     BP  10/16/19 1752 (!) 155/96     Pulse Rate 10/16/19 1752 88     Resp 10/16/19 1752 (!) 84     Temp 10/16/19 1752 99 F (37.2 C)     Temp Source 10/16/19 1752 Oral     SpO2 10/16/19 1752 100 %     Weight 10/16/19 1751 145 lb (65.8 kg)     Height --      Head Circumference --  Peak Flow --      Pain Score 10/16/19 1750 7     Pain Loc --      Pain Edu? --      Excl. in [place]? --    No data found.  Updated Vital Signs BP (!) 155/96 (BP Location: Right Arm)   Pulse 88   Temp 99 F (37.2 C) (Oral)   Resp (!) 84   Wt 63.5 kg   LMP 10/10/2013   SpO2 100%   BMI 25.61 kg/m     Physical Exam Constitutional:      General: She is not in acute distress.    Appearance: She is well-developed.  HENT:     Head: Normocephalic and atraumatic.     Right Ear: Tympanic membrane and ear canal normal.     Left Ear: Tympanic membrane and ear canal normal.     Nose: Nose normal. No congestion.     Mouth/Throat:     Mouth: Mucous membranes are moist.  Eyes:     Conjunctiva/sclera: Conjunctivae normal.     Pupils: Pupils are equal, round, and reactive to light.  Neck:     Musculoskeletal: Normal range of motion and neck supple.  Cardiovascular:     Rate and Rhythm: Normal rate and regular rhythm.     Heart sounds: Normal heart sounds.     Comments: Listened to her heart for a good 2 minutes.  No ectopic beats were identified Pulmonary:     Effort: Pulmonary effort is normal. No respiratory distress.     Breath sounds: Normal breath sounds. No wheezing or rales.  Abdominal:     General: There is no distension.     Palpations: Abdomen is soft.  Musculoskeletal: Normal range of motion.  Lymphadenopathy:     Cervical: No cervical adenopathy.  Skin:    General: Skin is warm and dry.  Neurological:     General: No focal deficit present.     Mental Status: She is alert.  Psychiatric:        Mood and Affect: Mood normal.        Behavior: Behavior normal.      UC Treatments / Results   Labs (all labs ordered are listed, but only abnormal results are displayed) Labs Reviewed  NOVEL CORONAVIRUS, NAA (HOSP ORDER, SEND-OUT TO REF LAB; TAT 18-24 HRS)    EKG   Radiology No results found.  Procedures Procedures (including critical care time)  Medications Ordered in UC Medications - No data to display  Initial Impression / Assessment and Plan / UC Course  I have reviewed the triage vital signs and the nursing notes.  Pertinent labs & imaging results that were available during my care of the patient were reviewed by me and considered in my medical decision making (see chart for details).     Discussed possible viral illness versus influenza versus coronavirus.  We will repeat the coronavirus testing.  Reassured regarding the sensation that she is having some irregular heartbeats.  Is not uncommon to feel an irregular heartbeat when you are laying down to sleep at night.  If she has symptoms with her irregular heartbeats or frequent beats she can take this up with her family doctor.  She needs to talk to her family doctor about her positive ANA.  Return as needed Final Clinical Impressions(s) / UC Diagnoses   Final diagnoses:  Postviral fatigue syndrome  Suspected COVID-19 virus infection  Positive ANA (antinuclear antibody)  Discharge Instructions     Rest.  Drink plenty of fluids Take Tylenol for any pain and fever Your Covid test result will show up on MyChart in 1 to 2 days We call all positive tests Call your family doctor to discuss your positive ANA It was a pleasure to meet you   ED Prescriptions    None     PDMP not reviewed this encounter.   Raylene Everts, MD 10/16/19 Kathyrn Drown

## 2019-10-16 NOTE — Discharge Instructions (Addendum)
Rest.  Drink plenty of fluids Take Tylenol for any pain and fever Your Covid test result will show up on MyChart in 1 to 2 days We call all positive tests Call your family doctor to discuss your positive ANA It was a pleasure to meet you

## 2019-10-19 LAB — NOVEL CORONAVIRUS, NAA (HOSP ORDER, SEND-OUT TO REF LAB; TAT 18-24 HRS): SARS-CoV-2, NAA: NOT DETECTED

## 2019-10-23 ENCOUNTER — Other Ambulatory Visit: Payer: Self-pay

## 2019-10-23 ENCOUNTER — Other Ambulatory Visit (INDEPENDENT_AMBULATORY_CARE_PROVIDER_SITE_OTHER): Payer: 59

## 2019-10-23 ENCOUNTER — Ambulatory Visit (INDEPENDENT_AMBULATORY_CARE_PROVIDER_SITE_OTHER): Payer: 59 | Admitting: Family

## 2019-10-23 ENCOUNTER — Encounter: Payer: Self-pay | Admitting: Family

## 2019-10-23 VITALS — BP 124/76 | HR 77 | Temp 98.3°F | Ht 62.0 in | Wt 145.0 lb

## 2019-10-23 DIAGNOSIS — Z8249 Family history of ischemic heart disease and other diseases of the circulatory system: Secondary | ICD-10-CM

## 2019-10-23 DIAGNOSIS — G709 Myoneural disorder, unspecified: Secondary | ICD-10-CM

## 2019-10-23 DIAGNOSIS — M791 Myalgia, unspecified site: Secondary | ICD-10-CM

## 2019-10-23 DIAGNOSIS — R002 Palpitations: Secondary | ICD-10-CM | POA: Diagnosis not present

## 2019-10-23 LAB — COMPREHENSIVE METABOLIC PANEL
ALT: 23 U/L (ref 0–35)
AST: 17 U/L (ref 0–37)
Albumin: 4.6 g/dL (ref 3.5–5.2)
Alkaline Phosphatase: 61 U/L (ref 39–117)
BUN: 18 mg/dL (ref 6–23)
CO2: 29 mEq/L (ref 19–32)
Calcium: 9.2 mg/dL (ref 8.4–10.5)
Chloride: 102 mEq/L (ref 96–112)
Creatinine, Ser: 0.71 mg/dL (ref 0.40–1.20)
GFR: 85.51 mL/min (ref >60.00)
Glucose, Bld: 97 mg/dL (ref 70–99)
Potassium: 3.8 mEq/L (ref 3.5–5.1)
Sodium: 139 mEq/L (ref 135–145)
Total Bilirubin: 0.3 mg/dL (ref 0.2–1.2)
Total Protein: 7.4 g/dL (ref 6.0–8.3)

## 2019-10-23 LAB — CBC WITH DIFFERENTIAL/PLATELET
Basophils Absolute: 0.1 10*3/uL (ref 0.0–0.1)
Basophils Relative: 0.8 % (ref 0.0–3.0)
Eosinophils Absolute: 0.4 10*3/uL (ref 0.0–0.7)
Eosinophils Relative: 4.4 % (ref 0.0–5.0)
HCT: 42.9 % (ref 36.0–46.0)
Hemoglobin: 14.3 g/dL (ref 12.0–15.0)
Lymphocytes Relative: 28.9 % (ref 12.0–46.0)
Lymphs Abs: 2.6 10*3/uL (ref 0.7–4.0)
MCHC: 33.4 g/dL (ref 30.0–36.0)
MCV: 86.7 fl (ref 78.0–100.0)
Monocytes Absolute: 0.6 10*3/uL (ref 0.1–1.0)
Monocytes Relative: 6.5 % (ref 3.0–12.0)
Neutro Abs: 5.3 10*3/uL (ref 1.4–7.7)
Neutrophils Relative %: 59.4 % (ref 43.0–77.0)
Platelets: 284 10*3/uL (ref 150.0–400.0)
RBC: 4.95 Mil/uL (ref 3.87–5.11)
RDW: 12.9 % (ref 11.5–15.5)
WBC: 9 10*3/uL (ref 4.0–10.5)

## 2019-10-23 LAB — VITAMIN B12: Vitamin B-12: 404 pg/mL (ref 211–911)

## 2019-10-23 LAB — TSH: TSH: 3.02 u[IU]/mL (ref 0.35–4.50)

## 2019-10-23 NOTE — Progress Notes (Signed)
Stephanie Gray is a 53 y.o. female with the following history as recorded in EpicCare:  Patient Active Problem List   Diagnosis Date Noted  . Hypothyroidism 03/17/2018  . Menorrhagia 03/17/2018  . Perimenopausal disorder 03/17/2018  . Lightheadedness 03/17/2018  . Neuromuscular disorder ([place])   . Depression   . Arthritis   . Anxiety   . Apocrine metaplasia of breast, right 03/03/2018  . Breast mass, right 04/12/2017  . Hypertension 06/28/2016  . Routine general medical examination at a health care facility 04/06/2016  . Elevated blood pressure reading without diagnosis of hypertension 06/12/2015  . Tubular adenoma of colon 04/27/2015  . Nonspecific abnormal results of thyroid function study 04/14/2009  . DEPRESSION 04/14/2009  . NONSPECIFIC ABNORMAL ELECTROCARDIOGRAM 04/14/2009    Current Outpatient Medications  Medication Sig Dispense Refill  . betamethasone valerate ointment (VALISONE) 0.1 % Apply 1 application topically 2 (two) times daily. Can use for up to 2 weeks, not for daily long term use. 30 g 0  . DULoxetine (CYMBALTA) 60 MG capsule Take 1 capsule (60 mg total) by mouth daily. 90 capsule 1  . estrogen, conjugated,-medroxyprogesterone (PREMPRO) 0.3-1.5 MG tablet Take 1 tablet by mouth daily. 90 tablet 3  . Vitamin D, Ergocalciferol, (DRISDOL) 1.25 MG (50000 UT) CAPS capsule Take 50,000 Units by mouth once a week.    . losartan (COZAAR) 100 MG tablet Take 1 tablet (100 mg total) by mouth daily. (Patient not taking: Reported on 09/24/2019) 90 tablet 3   No current facility-administered medications for this visit.     Allergies: Benzodiazepines, Other, Augmentin [amoxicillin-pot clavulanate], Lamictal [lamotrigine], and Penicillins  Past Medical History:  Diagnosis Date  . Anxiety   . Apocrine metaplasia of breast, right 03/03/2018  . Arthritis   . Breast mass, right 04/12/2017  . Cervical disc disease   . Complication of anesthesia    very sensitive to all  mdications,needs lower dosage  . Depression   . DEPRESSION 04/14/2009   Qualifier: Diagnosis of  By: Linna Darner MD, Gwyndolyn Saxon    . Hypertension   . Hypothyroidism 03/17/2018  . Lightheadedness 03/17/2018  . Neuromuscular disorder ([facility])    numbness in 2 fingers of left hand  . NONSPECIFIC ABNORMAL ELECTROCARDIOGRAM 04/14/2009   Qualifier: Diagnosis of  By: Linna Darner MD, Gwyndolyn Saxon    . Nonspecific abnormal results of thyroid function study 04/14/2009   Qualifier: Diagnosis of  By: Linna Darner MD, Gwyndolyn Saxon    . Perimenopausal disorder 03/17/2018  . Routine general medical examination at a health care facility 04/06/2016  . Tubular adenoma of colon 04/27/2015   04/07/2015 @ 70 cm ,polyp; Dr Collene Mares Due 2019     Past Surgical History:  Procedure Laterality Date  . APPENDECTOMY    . BLADDER REPAIR    . BREAST LUMPECTOMY WITH RADIOACTIVE SEED LOCALIZATION Right 02/10/2017   Procedure: RIGHT BREAST LUMPECTOMY WITH RADIOACTIVE SEED LOCALIZATION;  Surgeon: Autumn Messing III, MD;  Location: [facility];  Service: General;  Laterality: Right;  . CESAREAN SECTION     X 2, Dr Reino Kent  . EYE SURGERY Bilateral    Lasik   . TUBAL LIGATION     bladder laceration repair    Family History  Problem Relation Age of Onset  . Hypertension Mother   . Other Mother        congenital heart defect  . Diabetes Father   . Heart disease Father   . Hypertension Father   . Stroke Maternal Grandfather 34  dementia  . Cancer Paternal Grandfather        testicular  . Mitral valve prolapse Maternal Grandmother   . Heart disease Paternal Grandmother   . Breast cancer Paternal Grandmother   . Other Sister        neurologic issue    Social History   Tobacco Use  . Smoking status: Never Smoker  . Smokeless tobacco: Never Used  Substance Use Topics  . Alcohol use: Yes    Comment: Rarely    Subjective:  Presents for follow-up from recent [facility] visit; has been sick with respiratory symptoms/ persisting fatigue for the past 2 weeks- "just  wanted to get checked out." Notes that today is the first day she is actually starting to feel better; Is requesting referral to cardiology- mother has bivalve and her mother's cardiologist recommended that she get updated echo; patient also mentions that she has been having increased palpitations recently;  Also notes that [facility] MD recommended having ANA re-checked- last done in 2019;      Objective:  Vitals:   10/23/19 1142  BP: 124/76  Pulse: 77  Temp: 98.3 F (36.8 C)  TempSrc: Oral  SpO2: 99%  Weight: 145 lb (65.8 kg)  Height: 5' 2"  (1.575 m)    General: Well developed, well nourished, in no acute distress  Skin : Warm and dry.  Head: Normocephalic and atraumatic  Eyes: Sclera and conjunctiva clear; pupils round and reactive to light; extraocular movements intact  Ears: External normal; canals clear; tympanic membranes normal  Oropharynx: Pink, supple. No suspicious lesions  Neck: Supple without thyromegaly, adenopathy  Lungs: Respirations unlabored; clear to auscultation bilaterally without wheeze, rales, rhonchi  CVS exam: normal rate and regular rhythm.  Musculoskeletal: No deformities; no active joint inflammation  Extremities: No edema, cyanosis, clubbing  Vessels: Symmetric bilaterally  Neurologic: Alert and oriented; speech intact; face symmetrical; moves all extremities well; CNII-XII intact without focal deficit  Assessment:  1. Palpitations   2. Family history of mitral valve disorder   3. Myalgia   4. Neuromuscular disorder ([place]) Chronic    Plan:  1. & 2. Refer to cardiology; 3. Updated labs today as requested- appears to be resolving with no complications from recent viral illness; 4. Stable- no active concerns at this time.  No follow-ups on file.  Orders Placed This Encounter  Procedures  . CBC w/Diff    Standing Status:   Future    Number of Occurrences:   1    Standing Expiration Date:   10/22/2020  . Comp Met (CMET)    Standing Status:   Future     Number of Occurrences:   1    Standing Expiration Date:   10/22/2020  . B12    Standing Status:   Future    Number of Occurrences:   1    Standing Expiration Date:   10/22/2020  . Antinuclear Antib (ANA)    Standing Status:   Future    Number of Occurrences:   1    Standing Expiration Date:   10/22/2020  . TSH    Standing Status:   Future    Number of Occurrences:   1    Standing Expiration Date:   10/22/2020  . Ambulatory referral to Cardiology    Referral Priority:   Routine    Referral Type:   Consultation    Referral Reason:   Specialty Services Required    Requested Specialty:   Cardiology    Number of  Visits Requested:   1    Requested Prescriptions    No prescriptions requested or ordered in this encounter

## 2019-10-25 LAB — ANTI-NUCLEAR AB-TITER (ANA TITER)
ANA TITER: 1:80 {titer} — ABNORMAL HIGH
ANA Titer 1: 1:80 {titer} — ABNORMAL HIGH

## 2019-10-25 LAB — ANA: Anti Nuclear Antibody (ANA): POSITIVE — AB

## 2019-11-21 ENCOUNTER — Telehealth: Payer: Self-pay | Admitting: Radiology

## 2019-11-21 ENCOUNTER — Other Ambulatory Visit: Payer: Self-pay

## 2019-11-21 ENCOUNTER — Ambulatory Visit: Payer: 59 | Admitting: Cardiology

## 2019-11-21 ENCOUNTER — Encounter: Payer: Self-pay | Admitting: Cardiology

## 2019-11-21 ENCOUNTER — Ambulatory Visit
Admission: RE | Admit: 2019-11-21 | Discharge: 2019-11-21 | Disposition: A | Discharge status: Home / Self Care | Payer: Self-pay | Source: Ambulatory Visit | Attending: Cardiology | Admitting: Cardiology

## 2019-11-21 VITALS — BP 136/88 | HR 75 | Ht 62.0 in | Wt 142.0 lb

## 2019-11-21 DIAGNOSIS — Z8279 Family history of other congenital malformations, deformations and chromosomal abnormalities: Secondary | ICD-10-CM | POA: Diagnosis not present

## 2019-11-21 DIAGNOSIS — Z8249 Family history of ischemic heart disease and other diseases of the circulatory system: Secondary | ICD-10-CM | POA: Diagnosis not present

## 2019-11-21 DIAGNOSIS — I10 Essential (primary) hypertension: Secondary | ICD-10-CM | POA: Diagnosis not present

## 2019-11-21 DIAGNOSIS — R002 Palpitations: Secondary | ICD-10-CM

## 2019-11-21 NOTE — Telephone Encounter (Signed)
Enrolled patient for a 30 day [employer] event monitor to be mailed. Brief instructions were gone over with the patient and she knows to expect the monitor to arrive in 3-4 days.

## 2019-11-21 NOTE — Progress Notes (Addendum)
Cardiology Consult  Note    Date:  11/21/2019   ID:  MELAYNA RASPANTI, DOB 02-09-1966, MRN [id number]  PCP:  Marrian Salvage, FNP  Cardiologist:  Fransico Him, MD   Chief Complaint  Patient presents with  . New Patient (Initial Visit)    palpitations, family hx of BAV, fm hx of premature CAD    History of Present Illness:  Stephanie Gray is a 53 y.o. female who is being seen today for the evaluation of palpitations and family hx of bicuspid aortic valve disorder at the request of Marrian Salvage,*.  This is a 53yo female with a hx of anxiety, depression and HTN. Recently she has been having palpitations.  She had a febrile illness about a month ago lasting 2 weeks associated with SOB and severe fatigue.  She tested negative for COVID.  She has a hx of intermittent palpitations in the past but these got much worse during the time.  They have settled down but are still present and feel liked extra heart beats with a pause after it.  They doe not make her dizzy.  Her mother also has a bicuspid AV and she wanted screening done.  She also is concerned that her father has a significant hx of CAD at an early age in his 67's.   She denies any chest pain or pressure, SOB, DOE, PND, orthopnea, LE edema or syncope.  She occasionally will have a dizzy spell where she gets lightheaded but has never had syncope.  She is in menopause now and has had a lot of symptoms including hot flashes and is on HRT.   She is compliant with her meds and is tolerating meds with no SE.      Past Medical History:  Diagnosis Date  . Anxiety   . Apocrine metaplasia of breast, right 03/03/2018  . Arthritis   . Breast mass, right 04/12/2017  . Cervical disc disease   . Complication of anesthesia    very sensitive to all mdications,needs lower dosage  . Depression   . DEPRESSION 04/14/2009   Qualifier: Diagnosis of  By: Linna Darner MD, Gwyndolyn Saxon    . Family history of bicuspid aortic valve 11/21/2019   . Family history of premature CAD 11/21/2019  . Hypertension   . Hypothyroidism 03/17/2018  . Lightheadedness 03/17/2018  . Neuromuscular disorder ([facility])    numbness in 2 fingers of left hand  . NONSPECIFIC ABNORMAL ELECTROCARDIOGRAM 04/14/2009   Qualifier: Diagnosis of  By: Linna Darner MD, Gwyndolyn Saxon    . Nonspecific abnormal results of thyroid function study 04/14/2009   Qualifier: Diagnosis of  By: Linna Darner MD, Gwyndolyn Saxon    . Perimenopausal disorder 03/17/2018  . Routine general medical examination at a health care facility 04/06/2016  . Tubular adenoma of colon 04/27/2015   04/07/2015 @ 70 cm ,polyp; Dr Collene Mares Due 2019     Past Surgical History:  Procedure Laterality Date  . APPENDECTOMY    . BLADDER REPAIR    . BREAST LUMPECTOMY WITH RADIOACTIVE SEED LOCALIZATION Right 02/10/2017   Procedure: RIGHT BREAST LUMPECTOMY WITH RADIOACTIVE SEED LOCALIZATION;  Surgeon: Autumn Messing III, MD;  Location: [facility];  Service: General;  Laterality: Right;  . CESAREAN SECTION     X 2, Dr Reino Kent  . EYE SURGERY Bilateral    Lasik   . TUBAL LIGATION     bladder laceration repair    Current Medications: Current Meds  Medication Sig  . betamethasone valerate ointment (VALISONE) 0.1 %  Apply 1 application topically 2 (two) times daily. Can use for up to 2 weeks, not for daily long term use.  . DULoxetine (CYMBALTA) 60 MG capsule Take 1 capsule (60 mg total) by mouth daily.  Marland Kitchen estrogen, conjugated,-medroxyprogesterone (PREMPRO) 0.3-1.5 MG tablet Take 1 tablet by mouth daily.  Marland Kitchen losartan (COZAAR) 100 MG tablet Take 1 tablet (100 mg total) by mouth daily.  . Vitamin D, Ergocalciferol, (DRISDOL) 1.25 MG (50000 UT) CAPS capsule Take 50,000 Units by mouth once a week.    Allergies:   Benzodiazepines, Other, Augmentin [amoxicillin-pot clavulanate], Lamictal [lamotrigine], and Penicillins   Social History   Socioeconomic History  . Marital status: Married    Spouse name: Not on file  . Number of children: 2  . Years of  education: 50  . Highest education level: Not on file  Occupational History  . Occupation: [employer]  Social Needs  . Financial resource strain: Not on file  . Food insecurity    Worry: Not on file    Inability: Not on file  . Transportation needs    Medical: Not on file    Non-medical: Not on file  Tobacco Use  . Smoking status: Never Smoker  . Smokeless tobacco: Never Used  Substance and Sexual Activity  . Alcohol use: Yes    Comment: Rarely  . Drug use: No  . Sexual activity: Yes    Birth control/protection: Surgical    Comment: TBL  Lifestyle  . Physical activity    Days per week: Not on file    Minutes per session: Not on file  . Stress: Not on file  Relationships  . Social Herbalist on phone: Not on file    Gets together: Not on file    Attends religious service: Not on file    Active member of club or organization: Not on file    Attends meetings of clubs or organizations: Not on file    Relationship status: Not on file  Other Topics Concern  . Not on file  Social History Narrative   Fun: Ride horses.   Denies abuse and feels safe at home.      Family History:  The patient's family history includes Breast cancer in her paternal grandmother; Cancer in her paternal grandfather; Diabetes in her father; Heart disease in her father and paternal grandmother; Hypertension in her father and mother; Mitral valve prolapse in her maternal grandmother; Other in her mother and sister; Stroke (age of onset: 71) in her maternal grandfather.   ROS:   Please see the history of present illness.    ROS All other systems reviewed and are negative.  No flowsheet data found.     PHYSICAL EXAM:   VS:  BP 136/88   Pulse 75   Ht 5\' 2"  (1.575 m)   Wt 142 lb (64.4 kg)   LMP 10/10/2013   SpO2 98%   BMI 25.97 kg/m    GEN: Well nourished, well developed, in no acute distress  HEENT: normal  Neck: no JVD, carotid bruits, or masses Cardiac: RRR; no murmurs, rubs,  or gallops,no edema.  Intact distal pulses bilaterally.  Respiratory:  clear to auscultation bilaterally, normal work of breathing GI: soft, nontender, nondistended, + BS MS: no deformity or atrophy  Skin: warm and dry, no rash Neuro:  Alert and Oriented x 3, Strength and sensation are intact Psych: euthymic mood, full affect  Wt Readings from Last 3 Encounters:  11/21/19 142 lb (  64.4 kg)  10/23/19 145 lb (65.8 kg)  10/16/19 140 lb (63.5 kg)      Studies/Labs Reviewed:   EKG:  EKG is ordered today.  The ekg ordered today demonstrates NSR at 75bpm with no ST changes  Recent Labs: 10/23/2019: ALT 23; BUN 18; Creatinine, Ser 0.71; Hemoglobin 14.3; Platelets 284.0; Potassium 3.8; Sodium 139; TSH 3.02   Lipid Panel    Component Value Date/Time   CHOL 165 07/23/2019 1215   TRIG 65.0 07/23/2019 1215   HDL 63.60 07/23/2019 1215   CHOLHDL 3 07/23/2019 1215   VLDL 13.0 07/23/2019 1215   LDLCALC 88 07/23/2019 1215    Additional studies/ records that were reviewed today include:  none    ASSESSMENT:    1. Palpitations   2. Family history of bicuspid aortic valve   3. Essential hypertension   4. Family history of premature CAD      PLAN:  In order of problems listed above:  1.  Palpitations -her sx sound like PVCs -check 30 day event monitor to assess for arrhythmias  2.  Fm hx of bicuspid AV -I do not hear a heart murmur -will get a 2D echo to make sure she does not have a BAV as well  3.  HTN -BP controlled -continue Losartan 100mg  daily -She is on HRT due to menopause sx but DBP is mildly elevated -given her HTN I would recommend coming off HRT -I have encouraged her to talk with her PCP/GYN about other options for treatment of hot flashes including SSRI therapy and using vaginal estrogen cream for other menopause sx that is not systemically absorbed  4.  Family hx of premature CAD -her father had his first MI in his 44's -I have recommended a coronary calcium  score to assess cardiac risk -if she has coronary calcium then her LDL needs to be < 70 -we also discussed the importance of coming off HRT if she has an elevated coronary calcium score as well    Medication Adjustments/Labs and Tests Ordered: Current medicines are reviewed at length with the patient today.  Concerns regarding medicines are outlined above.  Medication changes, Labs and Tests ordered today are listed in the Patient Instructions below.  Patient Instructions  Medication Instructions:   Your physician recommends that you continue on your current medications as directed. Please refer to the Current Medication list given to you today.  *If you need a refill on your cardiac medications before your next appointment, please call your pharmacy*  Lab Work: None ordered.  Testing/Procedures: Your physician has requested that you have an echocardiogram. Echocardiography is a painless test that uses sound waves to create images of your heart. It provides your doctor with information about the size and shape of your heart and how well your heart's chambers and valves are working. This procedure takes approximately one hour. There are no restrictions for this procedure.  Your physician has recommended that you wear a 30 day event monitor. Event monitors are medical devices that record the heart's electrical activity. Doctors most often [place] these monitors to diagnose arrhythmias. Arrhythmias are problems with the speed or rhythm of the heartbeat. The monitor is a small, portable device. You can wear one while you do your normal daily activities. This is usually used to diagnose what is causing palpitations/syncope (passing out).  Your physician recommends that you have a Calcium Score CT today.   Follow-Up: Follow up with Dr. Radford Pax as needed based on testing results.  Signed, Fransico Him, MD  11/21/2019 1:58 PM    [facility] [address],  [place], [place]  [address] Phone: [phone]; Fax: [phone]

## 2019-11-21 NOTE — Patient Instructions (Signed)
Medication Instructions:   Your physician recommends that you continue on your current medications as directed. Please refer to the Current Medication list given to you today.  *If you need a refill on your cardiac medications before your next appointment, please call your pharmacy*  Lab Work: None ordered.  Testing/Procedures: Your physician has requested that you have an echocardiogram. Echocardiography is a painless test that uses sound waves to create images of your heart. It provides your doctor with information about the size and shape of your heart and how well your heart's chambers and valves are working. This procedure takes approximately one hour. There are no restrictions for this procedure.  Your physician has recommended that you wear a 30 day event monitor. Event monitors are medical devices that record the heart's electrical activity. Doctors most often [place] these monitors to diagnose arrhythmias. Arrhythmias are problems with the speed or rhythm of the heartbeat. The monitor is a small, portable device. You can wear one while you do your normal daily activities. This is usually used to diagnose what is causing palpitations/syncope (passing out).  Your physician recommends that you have a Calcium Score CT today.   Follow-Up: Follow up with Dr. Radford Pax as needed based on testing results.

## 2019-11-22 ENCOUNTER — Telehealth: Payer: Self-pay

## 2019-11-22 NOTE — Telephone Encounter (Signed)
-----   Message from Sueanne Margarita, MD sent at 11/21/2019  9:50 PM EST ----- Please let patient know that chest CTA showed plaque in the aorta but no other findings on noncardiac portion of chest CT

## 2019-11-22 NOTE — Telephone Encounter (Signed)
Notes recorded by Frederik Schmidt, RN on 11/22/2019 at 10:01 AM EST  The patient has been notified of the Cardiac CT result and verbalized understanding. All questions (if any) were answered.  Frederik Schmidt, RN 11/22/2019 10:01 AM

## 2019-11-29 ENCOUNTER — Ambulatory Visit (HOSPITAL_COMMUNITY): Payer: 59 | Attending: Cardiology

## 2019-11-29 ENCOUNTER — Other Ambulatory Visit: Payer: Self-pay

## 2019-11-29 DIAGNOSIS — R002 Palpitations: Secondary | ICD-10-CM | POA: Diagnosis not present

## 2019-11-29 DIAGNOSIS — I10 Essential (primary) hypertension: Secondary | ICD-10-CM | POA: Insufficient documentation

## 2020-01-22 ENCOUNTER — Telehealth: Payer: Self-pay

## 2020-01-22 NOTE — Telephone Encounter (Signed)
Spoke with patient's husband in regards to heart monitor that patient was mailed back in December 2020. He states that he doesn't think she has worn it but will have her call us back to discuss.

## 2020-01-24 ENCOUNTER — Encounter: Payer: Self-pay | Admitting: *Deleted

## 2020-01-24 NOTE — Progress Notes (Signed)
Patient ID: Stephanie Gray, female   DOB: 1966-06-10, 54 y.o.   MRN: [id number] 01/02/20 Received notification from [employer], patient requested cancellation of monitoring service.

## 2020-01-30 ENCOUNTER — Encounter (INDEPENDENT_AMBULATORY_CARE_PROVIDER_SITE_OTHER): Payer: 59

## 2020-01-30 DIAGNOSIS — I10 Essential (primary) hypertension: Secondary | ICD-10-CM

## 2020-01-30 DIAGNOSIS — R002 Palpitations: Secondary | ICD-10-CM | POA: Diagnosis not present

## 2020-03-13 ENCOUNTER — Telehealth: Payer: Self-pay

## 2020-03-13 ENCOUNTER — Encounter: Payer: Self-pay | Admitting: Cardiology

## 2020-03-13 DIAGNOSIS — I471 Supraventricular tachycardia: Secondary | ICD-10-CM | POA: Insufficient documentation

## 2020-03-13 DIAGNOSIS — I493 Ventricular premature depolarization: Secondary | ICD-10-CM | POA: Insufficient documentation

## 2020-03-13 MED ORDER — METOPROLOL SUCCINATE ER 25 MG PO TB24: 25.0000 mg | ORAL_TABLET | Freq: Every day | ORAL | 3 refills | Status: DC

## 2020-03-13 NOTE — Telephone Encounter (Signed)
  Patient Consent for Virtual Visit         Stephanie Gray has provided verbal consent on 03/13/2020 for a virtual visit (video or telephone).   CONSENT FOR VIRTUAL VISIT FOR:  Stephanie Gray  By participating in this virtual visit I agree to the following:  I hereby voluntarily request, consent and authorize [facility] and its employed or contracted physicians, Engineer, materials, nurse practitioners or other licensed health care professionals (the Practitioner), to provide me with telemedicine health care services (the "Services") as deemed necessary by the treating Practitioner. I acknowledge and consent to receive the Services by the Practitioner via telemedicine. I understand that the telemedicine visit will involve communicating with the Practitioner through live audiovisual communication technology and the disclosure of certain medical information by electronic transmission. I acknowledge that I have been given the opportunity to request an in-person assessment or other available alternative prior to the telemedicine visit and am voluntarily participating in the telemedicine visit.  I understand that I have the right to withhold or withdraw my consent to the use of telemedicine in the course of my care at any time, without affecting my right to future care or treatment, and that the Practitioner or I may terminate the telemedicine visit at any time. I understand that I have the right to inspect all information obtained and/or recorded in the course of the telemedicine visit and may receive copies of available information for a reasonable fee.  I understand that some of the potential risks of receiving the Services via telemedicine include:  Marland Kitchen Delay or interruption in medical evaluation due to technological equipment failure or disruption; . Information transmitted may not be sufficient (e.g. poor resolution of images) to allow for appropriate medical decision making by the  Practitioner; and/or  . In rare instances, security protocols could fail, causing a breach of personal health information.  Furthermore, I acknowledge that it is my responsibility to provide information about my medical history, conditions and care that is complete and accurate to the best of my ability. I acknowledge that Practitioner's advice, recommendations, and/or decision may be based on factors not within their control, such as incomplete or inaccurate data provided by me or distortions of diagnostic images or specimens that may result from electronic transmissions. I understand that the practice of medicine is not an exact science and that Practitioner makes no warranties or guarantees regarding treatment outcomes. I acknowledge that a copy of this consent can be made available to me via my patient portal ([facility]), or I can request a printed copy by calling the office of [facility].    I understand that my insurance will be billed for this visit.   I have read or had this consent read to me. . I understand the contents of this consent, which adequately explains the benefits and risks of the Services being provided via telemedicine.  . I have been provided ample opportunity to ask questions regarding this consent and the Services and have had my questions answered to my satisfaction. . I give my informed consent for the services to be provided through the use of telemedicine in my medical care

## 2020-03-13 NOTE — Telephone Encounter (Signed)
-----   Message from Sueanne Margarita, MD sent at 03/13/2020  1:42 PM EDT ----- Heart monitor showed NSR with occasional extra heart beats from the top and bottom of her heart which are benign.  Please start Toprol XL 25mg  daily and followup with extender in 4 weeks virtual

## 2020-03-13 NOTE — Telephone Encounter (Signed)
The patient has been notified of the result and verbalized understanding.  All questions (if any) were answered. Antonieta Iba, RN 03/13/2020 3:46 PM

## 2020-03-19 ENCOUNTER — Other Ambulatory Visit (HOSPITAL_COMMUNITY): Payer: Self-pay

## 2020-03-19 DIAGNOSIS — F330 Major depressive disorder, recurrent, mild: Secondary | ICD-10-CM

## 2020-03-19 DIAGNOSIS — F419 Anxiety disorder, unspecified: Secondary | ICD-10-CM

## 2020-03-19 MED ORDER — DULOXETINE HCL 60 MG PO CPEP: 60.0000 mg | ORAL_CAPSULE | Freq: Every day | ORAL | 0 refills | Status: DC

## 2020-03-24 ENCOUNTER — Ambulatory Visit (HOSPITAL_COMMUNITY): Payer: 59 | Admitting: Psychiatry

## 2020-04-01 ENCOUNTER — Ambulatory Visit (HOSPITAL_COMMUNITY): Payer: 59 | Admitting: Psychiatry

## 2020-04-01 ENCOUNTER — Other Ambulatory Visit: Payer: Self-pay

## 2020-04-07 ENCOUNTER — Other Ambulatory Visit (HOSPITAL_COMMUNITY): Payer: Self-pay

## 2020-04-07 ENCOUNTER — Ambulatory Visit (INDEPENDENT_AMBULATORY_CARE_PROVIDER_SITE_OTHER): Payer: 59 | Admitting: Psychiatry

## 2020-04-07 ENCOUNTER — Other Ambulatory Visit: Payer: Self-pay

## 2020-04-07 ENCOUNTER — Encounter (HOSPITAL_COMMUNITY): Payer: Self-pay | Admitting: Psychiatry

## 2020-04-07 DIAGNOSIS — F419 Anxiety disorder, unspecified: Secondary | ICD-10-CM | POA: Diagnosis not present

## 2020-04-07 DIAGNOSIS — F330 Major depressive disorder, recurrent, mild: Secondary | ICD-10-CM | POA: Diagnosis not present

## 2020-04-07 MED ORDER — DULOXETINE HCL 60 MG PO CPEP: 60.0000 mg | ORAL_CAPSULE | Freq: Every day | ORAL | 0 refills | Status: DC

## 2020-04-07 NOTE — Progress Notes (Signed)
Virtual Visit via Telephone Note  I connected with Stephanie Gray on 04/07/20 at  2:30 PM EDT by telephone and verified that I am speaking with the correct person using two identifiers.   I discussed the limitations, risks, security and privacy concerns of performing an evaluation and management service by telephone and the availability of in person appointments. I also discussed with the patient that there may be a patient responsible charge related to this service. The patient expressed understanding and agreed to proceed.   History of Present Illness: Patient was evaluated by phone session.  She is taking Cymbalta 60 mg and she feels her anxiety is under control.  Patient is seen cardiology and had a Holter monitor and found to have PVCs and atrial regurgitation.  She is taking blood pressure medication.  Her PCP wanted to switch from Cymbalta to Paxil to help her flashback as patient is not a candidate for hormone replacement therapy because of hypertension.  Patient does not want to change her antidepressant since Cymbalta is working very good.  She occasionally have insomnia and hot flashes but otherwise no major symptoms.  She lives with her husband who is supportive.  Lately she has been very busy working outside.  She has no tremors shakes or any EPS.  Her energy level is good.  She denies any paranoia or any hallucination.  Patient told her family is doing well and she has no concerns.    Past Psychiatric History:Reviewed. H/Odepressionand anxiety. Startedmedication after her son was born. TookPaxil, Zoloft and propanolol for anxiety. Lamictal caused rash. No h/oinpatient or suicidal.H/O episodic hypomanic-like symptomsas more energy and poor sleep but able to function. No h/oparanoiaandhallucination. SawParrish McKinney for10 years.   Psychiatric Specialty Exam: Physical Exam  Review of Systems  Last menstrual period 10/10/2013.There is no height or weight on file  to calculate BMI.  General Appearance: NA  Eye Contact:  NA  Speech:  Clear and Coherent  Volume:  Normal  Mood:  Euthymic  Affect:  NA  Thought Process:  Goal Directed  Orientation:  Full (Time, Place, and Person)  Thought Content:  WDL  Suicidal Thoughts:  No  Homicidal Thoughts:  No  Memory:  Immediate;   Good Recent;   Good Remote;   Good  Judgement:  Good  Insight:  Good  Psychomotor Activity:  NA  Concentration:  Concentration: Good and Attention Span: Good  Recall:  Good  Fund of Knowledge:  Good  Language:  Good  Akathisia:  No  Handed:  Right  AIMS (if indicated):     Assets:  Communication Skills Desire for Improvement Housing Resilience Social Support Talents/Skills Transportation  ADL's:  Intact  Cognition:  WNL  Sleep:   fair      Assessment and Plan: Major depressive disorder, recurrent.  Anxiety.  I reviewed notes from cardiology.  Her CBC and chemistry is normal.  She does not want to switch her antidepressants since Cymbalta 60 mg working well for anxiety.  I recommend she can try over-the-counter melatonin to help insomnia and she agreed with the plan.  Discussed medication side effects and benefits.  Continue Cymbalta 60 mg daily.  Recommended to call us back if she has any question or any concern.  Follow-up with 6 months.  Follow Up Instructions:    I discussed the assessment and treatment plan with the patient. The patient was provided an opportunity to ask questions and all were answered. The patient agreed with the plan and demonstrated  an understanding of the instructions.   The patient was advised to call back or seek an in-person evaluation if the symptoms worsen or if the condition fails to improve as anticipated.  I provided 20 minutes of non-face-to-face time during this encounter.   Kathlee Nations, MD

## 2020-04-09 ENCOUNTER — Encounter: Payer: Self-pay | Admitting: Physician Assistant

## 2020-04-09 NOTE — Progress Notes (Signed)
Virtual Visit via Telephone Note   This visit type was conducted due to national recommendations for restrictions regarding the COVID-19 Pandemic (e.g. social distancing) in an effort to limit this patient's exposure and mitigate transmission in our community.  Due to her co-morbid illnesses, this patient is at least at moderate risk for complications without adequate follow up.  This format is felt to be most appropriate for this patient at this time.  The patient did not have access to video technology/had technical difficulties with video requiring transitioning to audio format only (telephone).  All issues noted in this document were discussed and addressed.  No physical exam could be performed with this format.  Please refer to the patient's chart for her  consent to telehealth for [facility].   The patient was identified using 2 identifiers.  Date:  04/11/2020   ID:  [name], DOB 21-Jul-1966, MRN [id number]  Patient Location: Home Provider Location: Office  PCP:  Marrian Salvage, [place]  Cardiologist:  Fransico Him, MD  Electrophysiologist:  None   Evaluation Performed:  Follow-Up Visit  Chief Complaint:  F/u palpitations  History of Present Illness:    Stephanie Gray is a 54 y.o. female with anxiety, depression, cervical disc disease, sensitivity to medications, HTN, hypothyroidism who presents for follow-up. She recently saw Dr. Radford Pax in 11/2019 for palpitations. She had had a febrile illness about a month prior lasting 2 weeks associated with SOB and severe fatigue. She tested negative for COVID. She has a hx of intermittent palpitations in the past but these got much worse during the time.  They have settled down but were still present prompting cardiology evalution. She also reported family history of CAD (father had first MI in his 70s) and mother with history of bicuspid AV. 2D echo 11/29/19 showed EF 60-65%, mild asymmetric LVH, mild aortic  insufficiency (tricuspid). Monitor showed occasional PACs, nonsustained atrial tach up to 4 beats, occasional PVCs, HR rate 54 to 152bpm, average 84bpm. Dr. Radford Pax recommended Toprol XL 25mg  daily. She does recall being on propranolol many years ago for performance anxiety. Calcium score was zero for coronary calcium but did show aortic atherosclerosis. Last labs personally reviewed from 10/2019 with normal CBC, CMET, TSH, 07/2019 LDL 88.  She is seen back for follow-up virtually today feeling well. Interestingly she reports that her SOB and fatigue improved significantly after her first Covid vaccine, but does report the second dose brought side effects. Overall though she feels recovered from that original event. The palpitations are better as well, although still occasional. Her blood pressure cuff is not working today but she reports when she takes it at home it's usually normal. 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   Past Medical History:  Diagnosis Date  . Anxiety   . Apocrine metaplasia of breast, right 03/03/2018  . Arthritis   . Atrial tachycardia, paroxysmal ([place])    noted on event monitor 02/2020  . Breast mass, right 04/12/2017  . Cervical disc disease   . Complication of anesthesia    very sensitive to all mdications,needs lower dosage  . Depression   . Hypertension   . Hypothyroidism 03/17/2018  . Neuromuscular disorder (H[facility])    numbness in 2 fingers of left hand  . Perimenopausal disorder 03/17/2018  . Premature atrial contractions   . PVC's (premature ventricular contractions)    noted on heart monitor 02/2020  . Routine  general medical examination at a health care facility 04/06/2016  . Tubular adenoma of colon 04/27/2015   04/07/2015 @ 70 cm ,polyp; Dr Collene Mares Due 2019    Past Surgical History:  Procedure Laterality Date  . APPENDECTOMY    . BLADDER REPAIR    . BREAST LUMPECTOMY WITH RADIOACTIVE SEED LOCALIZATION Right  02/10/2017   Procedure: RIGHT BREAST LUMPECTOMY WITH RADIOACTIVE SEED LOCALIZATION;  Surgeon: Autumn Messing III, MD;  Location: [facility];  Service: General;  Laterality: Right;  . CESAREAN SECTION     X 2, Dr Reino Kent  . EYE SURGERY Bilateral    Lasik   . TUBAL LIGATION     bladder laceration repair     Current Meds  Medication Sig  . betamethasone valerate ointment (VALISONE) 0.1 % Apply 1 application topically 2 (two) times daily. Can use for up to 2 weeks, not for daily long term use.  . DULoxetine (CYMBALTA) 60 MG capsule Take 1 capsule (60 mg total) by mouth daily.  Marland Kitchen losartan (COZAAR) 100 MG tablet Take 1 tablet (100 mg total) by mouth daily.  . metoprolol succinate (TOPROL XL) 25 MG 24 hr tablet Take 1 tablet (25 mg total) by mouth daily.  . Vitamin D, Ergocalciferol, (DRISDOL) 1.25 MG (50000 UT) CAPS capsule Take 50,000 Units by mouth once a week.     Allergies:   Benzodiazepines, Other, Augmentin [amoxicillin-pot clavulanate], Lamictal [lamotrigine], and Penicillins   Social History   Tobacco Use  . Smoking status: Never Smoker  . Smokeless tobacco: Never Used  Substance Use Topics  . Alcohol use: Yes    Comment: Rarely  . Drug use: No     Family Hx: The patient's family history includes Breast cancer in her paternal grandmother; Cancer in her paternal grandfather; Diabetes in her father; Heart disease in her father and paternal grandmother; Hypertension in her father and mother; Mitral valve prolapse in her maternal grandmother; Other in her mother and sister; Stroke (age of onset: 74) in her maternal grandfather.  ROS:   Please see the history of present illness.    All other systems reviewed and are negative.   Prior CV studies:   The following studies were reviewed today:  2D echo 11/2019 1. Left ventricular ejection fraction, by visual estimation, is 60 to  65%. The left ventricle has normal function. There is mildly increased  asymmetric left ventricular  hypertrophy.  2. The left ventricle has no regional wall motion abnormalities.  3. Left ventricular diastolic parameters are indeterminate.  4. Global right ventricle has normal systolic function.The right  ventricular size is normal.  5. Left atrial size was normal.  6. Right atrial size was normal.  7. The mitral valve is normal in structure. No evidence of mitral valve  regurgitation.  8. The tricuspid valve is normal in structure. Tricuspid valve  regurgitation is not demonstrated.  9. The aortic valve is tricuspid. Aortic valve regurgitation is mild. No  evidence of aortic valve sclerosis or stenosis.  10. The pulmonic valve was not well visualized. Pulmonic valve  regurgitation is trivial.  11. TR signal is inadequate for assessing pulmonary artery systolic  pressure.  12. The inferior vena cava is normal in size with greater than 50%  respiratory variability, suggesting right atrial pressure of 3 mmHg.  Coronary CTA 11/2019 IMPRESSION: Coronary calcium score of 0. This was 0 percentile for age and sex matched control. Traci Turner  IMPRESSION: 1.  No acute findings in the imaged extracardiac chest. 2.  Aortic Atherosclerosis (ICD10-I70.0).  Event Monitor Resulted 02/2020  Sinus bradycardia, normal sinus rhythm and sinus tachycardia. The average heart rate was 84bpm and ranged from 54 to 152bpm.  Occasional PACs and nonsustained atrial tachycardia up to 4 beats.  Occasional PVCs.    Labs/Other Tests and Data Reviewed:    EKG:  An ECG dated 11/21/2019 was personally reviewed today and demonstrated:  NSR 75bpm, rightward axis, no acute STT changes  Recent Labs: 10/23/2019: ALT 23; BUN 18; Creatinine, Ser 0.71; Hemoglobin 14.3; Platelets 284.0; Potassium 3.8; Sodium 139; TSH 3.02   Recent Lipid Panel Lab Results  Component Value Date/Time   CHOL 165 07/23/2019 12:15 PM   TRIG 65.0 07/23/2019 12:15 PM   HDL 63.60 07/23/2019 12:15 PM   CHOLHDL 3 07/23/2019  12:15 PM   LDLCALC 88 07/23/2019 12:15 PM    Wt Readings from Last 3 Encounters:  04/11/20 142 lb (64.4 kg)  11/21/19 142 lb (64.4 kg)  10/23/19 145 lb (65.8 kg)     Objective:    Vital Signs:  Ht 5\' 2"  (1.575 m)   Wt 142 lb (64.4 kg)   LMP 10/10/2013   BMI 25.97 kg/m    VS reviewed. General - alert female in no acute distress Pulm - No labored breathing, no coughing during visit, no audible wheezing, speaking in full sentences Neuro - A+Ox3, no slurred speech, answers questions appropriately Psych - Pleasant affect  ASSESSMENT & PLAN:    1. PVCs/PACs - noted on monitor, likely the etiology of her palpitations. Will re-route metoprolol rx to her preferred pharmacy. Asked her to f/u BP and HR over the next week and relay those readings via MyChart (link sent to patient while on phone per our discussion).  2. Nonsustained atrial tachycardia - plan to monitor response with metoprolol. 3. Aortic atherosclerosis - discussed goal BP <130 and LDL <70. Last LDL was above goal so would suggest low dose statin initiation for risk reduction. She prefers to only make one medication change at a time because she has been sensitive to medications in the past. Per our discussion we will have her come in for fasting labs to reassess more current LDL. 4. Essential HTN - unable to check vitals today but she will look into fixing her device. Instructed to obtain BP/HR for 1 week after initiation of metoprolol. She plans to send those readings in via MyChart.  5. LVH - asymmetric LVH noted on echo. She was concerned about this finding on her report. Discussed echo result today. We also discussed goal blood pressures as it pertains to this finding - would aim for goal BP <130/80. We will await her blood pressure readings as above. This is of no hemodynamic consequence otherwise at this time.  Time:   Today, I have spent 15 minutes with the patient with telehealth technology discussing the above problems.      Medication Adjustments/Labs and Tests Ordered: Current medicines are reviewed at length with the patient today.  Testing and concerns regarding medicines are outlined above.    Follow Up:  Either In Person or Virtual 3 months with me or Dr. Radford Pax  Signed, Charlie Pitter, PA-C  04/11/2020 10:19 AM    [place]

## 2020-04-11 ENCOUNTER — Other Ambulatory Visit: Payer: Self-pay

## 2020-04-11 ENCOUNTER — Encounter: Payer: Self-pay | Admitting: Physician Assistant

## 2020-04-11 ENCOUNTER — Telehealth (INDEPENDENT_AMBULATORY_CARE_PROVIDER_SITE_OTHER): Payer: 59 | Admitting: Physician Assistant

## 2020-04-11 VITALS — Ht 62.0 in | Wt 142.0 lb

## 2020-04-11 DIAGNOSIS — I517 Cardiomegaly: Secondary | ICD-10-CM

## 2020-04-11 DIAGNOSIS — I493 Ventricular premature depolarization: Secondary | ICD-10-CM | POA: Diagnosis not present

## 2020-04-11 DIAGNOSIS — I700 Atherosclerosis of aorta: Secondary | ICD-10-CM | POA: Diagnosis not present

## 2020-04-11 DIAGNOSIS — I491 Atrial premature depolarization: Secondary | ICD-10-CM | POA: Diagnosis not present

## 2020-04-11 DIAGNOSIS — I471 Supraventricular tachycardia: Secondary | ICD-10-CM

## 2020-04-11 DIAGNOSIS — I10 Essential (primary) hypertension: Secondary | ICD-10-CM

## 2020-04-11 MED ORDER — METOPROLOL SUCCINATE ER 25 MG PO TB24: 25.0000 mg | ORAL_TABLET | Freq: Every day | ORAL | 3 refills | Status: DC

## 2020-04-11 NOTE — Patient Instructions (Addendum)
Medication Instructions:  Your physician recommends that you continue on your current medications as directed. Please refer to the  Current Medication list given to you today.  We have resent in your Metoprolol to [facility] on [address]  *If you need a refill on your cardiac medications before your next appointment, please call your pharmacy*   Lab Work: 04/14/2020:  Come to the office for Fasting Labwork : CMET & LIPID  If you have labs (blood work) drawn today and your tests are completely normal, you will receive your results only by: Marland Kitchen MyChart Message (if you have MyChart) OR . A paper copy in the mail If you have any lab test that is abnormal or we need to change your treatment, we will call you to review the results.   Testing/Procedures: None ordered   Follow-Up: At [facility], you and your health needs are our priority.  As part of our continuing mission to provide you with exceptional heart care, we have created designated Provider Care Teams.  These Care Teams include your primary Cardiologist (physician) and Advanced Practice Providers (APPs -  Physician Assistants and Nurse Practitioners) who all work together to provide you with the care you need, when you need it.  We recommend signing up for the patient portal called "MyChart".  Sign up information is provided on this After Visit Summary.  MyChart is used to connect with patients for Virtual Visits (Telemedicine).  Patients are able to view lab/test results, encounter notes, upcoming appointments, etc.  Non-urgent messages can be sent to your provider as well.   To learn more about what you can do with MyChart, go to [email or web address].    Your next appointment:   3 month(s)  The format for your next appointment:   In Person  Provider:   Melina Copa, PA-C   Other Instructions  I would recommend using a blood pressure cuff that goes on your arm. The wrist ones can be inaccurate. If possible, try to select  one that also reports your heart rate. To check your blood pressure, choose a time at least 3 hours after taking your blood pressure medicines. If you can sample it at different times of the day, that's great - it might give you more information about how your blood pressure fluctuates. Remain seated in a chair for 5 minutes quietly beforehand, then check it. Please record a list of those readings and call us/send in MyChart message with them for our review in 1 week.

## 2020-04-14 ENCOUNTER — Other Ambulatory Visit: Payer: 59

## 2020-06-24 DIAGNOSIS — Z1211 Encounter for screening for malignant neoplasm of colon: Secondary | ICD-10-CM | POA: Diagnosis not present

## 2020-06-24 DIAGNOSIS — K219 Gastro-esophageal reflux disease without esophagitis: Secondary | ICD-10-CM | POA: Diagnosis not present

## 2020-06-24 DIAGNOSIS — Z86010 Personal history of colonic polyps: Secondary | ICD-10-CM | POA: Diagnosis not present

## 2020-06-25 NOTE — Progress Notes (Deleted)
Cardiology Office Note    Date:  06/25/2020   ID:  DNASIA GAUNA, DOB [birth year], MRN [id number]  PCP:  Marrian Salvage, FNP  Cardiologist:  Fransico Him, MD  Electrophysiologist:  None   Chief Complaint: f/u palpitations  History of Present Illness:   Stephanie Gray is a 54 y.o. female with history of anxiety, depression, cervical disc disease, sensitivity to medications, HTN, hypothyroidism who presents for follow-up.   She was remotely on propranolol for performance anxiety. She recently saw Dr. Radford Pax in 11/2019 for palpitations. She had had a febrile illness about a month prior lasting 2 weeks associated with SOB and severe fatigue. She tested negative for COVID. She has a hx of intermittent palpitations in the past but these got much worse during the time. They settled down but were still present prompting cardiology evalution. She also reported family history of CAD (father had first MI in his 70s) and mother with history of bicuspid AV. 2D echo 11/29/19 showed EF 60-65%, mild asymmetric LVH, mild aortic insufficiency (tricuspid). Event monitor showed occasional PACs, nonsustained atrial tach up to 4 beats, occasional PVCs, HR rate 54 to 152bpm, average 84bpm. Dr. Radford Pax recommended Toprol XL 25mg  daily. Calcium score was zero for coronary calcium but did show aortic atherosclerosis. 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 Initially she deferred starting the beta blocker, but per 04/11/20 OV she elected to trial this.  see last note, didnt start statin 2/2 sensitive to meds - fasting labs planned  PVCs/PACs Nonsustained atrial tachycardia Aortic atherosclerosis Essential HTN     Labwork independently reviewed: 10/2019 with normal CBC, CMET, TSH, 07/2019 LDL 88.   Past Medical History:  Diagnosis Date  . Anxiety   . Apocrine metaplasia of breast, right 03/03/2018  . Arthritis   . Atrial tachycardia,  paroxysmal ([place])    noted on event monitor 02/2020  . Breast mass, right 04/12/2017  . Cervical disc disease   . Complication of anesthesia    very sensitive to all mdications,needs lower dosage  . Depression   . Hypertension   . Hypothyroidism 03/17/2018  . Neuromuscular disorder (HCC)    numbness in 2 fingers of left hand  . Perimenopausal disorder 03/17/2018  . Premature atrial contractions   . PVC's (premature ventricular contractions)    noted on heart monitor 02/2020  . Routine general medical examination at a health care facility 04/06/2016  . Tubular adenoma of colon 04/27/2015   04/07/2015 @ 70 cm ,polyp; Dr Collene Mares Due 2019     Past Surgical History:  Procedure Laterality Date  . APPENDECTOMY    . BLADDER REPAIR    . BREAST LUMPECTOMY WITH RADIOACTIVE SEED LOCALIZATION Right 02/10/2017   Procedure: RIGHT BREAST LUMPECTOMY WITH RADIOACTIVE SEED LOCALIZATION;  Surgeon: Autumn Messing III, MD;  Location: [facility];  Service: General;  Laterality: Right;  . CESAREAN SECTION     X 2, Dr Reino Kent  . EYE SURGERY Bilateral    Lasik   . TUBAL LIGATION     bladder laceration repair    Current Medications: No outpatient medications have been marked as taking for the 06/27/20 encounter (Appointment) with Charlie Pitter, PA-C.   ***   Allergies:   Benzodiazepines, Other, Augmentin [amoxicillin-pot clavulanate], Lamictal [lamotrigine], and Penicillins   Social History   Socioeconomic History  . Marital status: Married    Spouse name: Not on file  . Number of  children: 2  . Years of education: 80  . Highest education level: Not on file  Occupational History  . Occupation: Horse Farm  Tobacco Use  . Smoking status: Never Smoker  . Smokeless tobacco: Never Used  Vaping Use  . Vaping Use: Never used  Substance and Sexual Activity  . Alcohol use: Yes    Comment: Rarely  . Drug use: No  . Sexual activity: Yes    Birth control/protection: Surgical    Comment: TBL  Other Topics Concern  . Not  on file  Social History Narrative   Fun: Ride horses.   Denies abuse and feels safe at home.    Social Determinants of Health   Financial Resource Strain:   . Difficulty of Paying Living Expenses:   Food Insecurity:   . Worried About Charity fundraiser in the Last Year:   . Arboriculturist in the Last Year:   Transportation Needs:   . Film/video editor (Medical):   Marland Kitchen Lack of Transportation (Non-Medical):   Physical Activity:   . Days of Exercise per Week:   . Minutes of Exercise per Session:   Stress:   . Feeling of Stress :   Social Connections:   . Frequency of Communication with Friends and Family:   . Frequency of Social Gatherings with Friends and Family:   . Attends Religious Services:   . Active Member of Clubs or Organizations:   . Attends Archivist Meetings:   Marland Kitchen Marital Status:      Family History:  The patient's ***family history includes Breast cancer in her paternal grandmother; Cancer in her paternal grandfather; Diabetes in her father; Heart disease in her father and paternal grandmother; Hypertension in her father and mother; Mitral valve prolapse in her maternal grandmother; Other in her mother and sister; Stroke (age of onset: 10) in her maternal grandfather.  ROS:   Please see the history of present illness. Otherwise, review of systems is positive for ***.  All other systems are reviewed and otherwise negative.    EKGs/Labs/Other Studies Reviewed:    Studies reviewed are outlined and summarized above. Reports included below if pertinent.  2D echo 11/2019 1. Left ventricular ejection fraction, by visual estimation, is 60 to  65%. The left ventricle has normal function. There is mildly increased  asymmetric left ventricular hypertrophy.  2. The left ventricle has no regional wall motion abnormalities.  3. Left ventricular diastolic parameters are indeterminate.  4. Global right ventricle has normal systolic function.The right    ventricular size is normal.  5. Left atrial size was normal.  6. Right atrial size was normal.  7. The mitral valve is normal in structure. No evidence of mitral valve  regurgitation.  8. The tricuspid valve is normal in structure. Tricuspid valve  regurgitation is not demonstrated.  9. The aortic valve is tricuspid. Aortic valve regurgitation is mild. No  evidence of aortic valve sclerosis or stenosis.  10. The pulmonic valve was not well visualized. Pulmonic valve  regurgitation is trivial.  11. TR signal is inadequate for assessing pulmonary artery systolic  pressure.  12. The inferior vena cava is normal in size with greater than 50%  respiratory variability, suggesting right atrial pressure of 3 mmHg.  Coronary CTA 11/2019 IMPRESSION: Coronary calcium score of 0. This was 0 percentile for age and sex matched control. Traci Turner  IMPRESSION: 1. No acute findings in the imaged extracardiac chest. 2. Aortic Atherosclerosis (ICD10-I70.0).  Event  Monitor Resulted 02/2020  Sinus bradycardia, normal sinus rhythm and sinus tachycardia. The average heart rate was 84bpm and ranged from 54 to 152bpm.  Occasional PACs and nonsustained atrial tachycardia up to 4 beats.  Occasional PVCs.    EKG:  EKG is ordered today, personally reviewed, demonstrating ***  Recent Labs: 10/23/2019: ALT 23; BUN 18; Creatinine, Ser 0.71; Hemoglobin 14.3; Platelets 284.0; Potassium 3.8; Sodium 139; TSH 3.02  Recent Lipid Panel    Component Value Date/Time   CHOL 165 07/23/2019 1215   TRIG 65.0 07/23/2019 1215   HDL 63.60 07/23/2019 1215   CHOLHDL 3 07/23/2019 1215   VLDL 13.0 07/23/2019 1215   LDLCALC 88 07/23/2019 1215    PHYSICAL EXAM:    VS:  LMP 10/10/2013   BMI: There is no height or weight on file to calculate BMI.  GEN: Well nourished, well developed, in no acute distress HEENT: normocephalic, atraumatic Neck: no JVD, carotid bruits, or masses Cardiac: ***RRR; no murmurs,  rubs, or gallops, no edema  Respiratory:  clear to auscultation bilaterally, normal work of breathing GI: soft, nontender, nondistended, + BS MS: no deformity or atrophy Skin: warm and dry, no rash Neuro:  Alert and Oriented x 3, Strength and sensation are intact, follows commands Psych: euthymic mood, full affect  Wt Readings from Last 3 Encounters:  04/11/20 142 lb (64.4 kg)  11/21/19 142 lb (64.4 kg)  10/23/19 145 lb (65.8 kg)     ASSESSMENT & PLAN:   1. ***  Disposition: F/u with ***   Medication Adjustments/Labs and Tests Ordered: Current medicines are reviewed at length with the patient today.  Concerns regarding medicines are outlined above. Medication changes, Labs and Tests ordered today are summarized above and listed in the Patient Instructions accessible in Encounters.   Signed, Charlie Pitter, PA-C  06/25/2020 7:59 AM    [facility] [place], [place], [place]  [address] Phone: [phone]; Fax: [phone]

## 2020-06-27 ENCOUNTER — Ambulatory Visit: Payer: 59 | Admitting: Physician Assistant

## 2020-06-27 DIAGNOSIS — M25511 Pain in right shoulder: Secondary | ICD-10-CM | POA: Diagnosis not present

## 2020-06-27 DIAGNOSIS — M25512 Pain in left shoulder: Secondary | ICD-10-CM | POA: Diagnosis not present

## 2020-07-01 NOTE — Progress Notes (Deleted)
CARDIOLOGY OFFICE NOTE  Date:  07/01/2020    Stephanie Gray Date of Birth: 26-Jul-1966 Medical Record #[id number]  PCP:  Marrian Salvage, FNP  Cardiologist:  Radford Pax  No chief complaint on file.   History of Present Illness: Stephanie Gray is a 54 y.o. female who presents today for a work in visit. Seen for Dr. Radford Pax.   She has a history of anxiety, depression, cervical disc disease, sensitivity to medications, HTN, and hypothyroidism.   She saw Dr. Radford Pax in 11/2019 for palpitations. She had had a febrile illness about a month prior lasting 2 weeks associated with SOB and severe fatigue. She tested negative for COVID. She also reported family history of CAD (father had first MI in his 26s) and mother with history of bicuspid AV. 2D echo 11/29/19 showed EF 60-65%, mild asymmetric LVH, mild aortic insufficiency (tricuspid). Monitor showed occasional PACs, nonsustained atrial tach up to 4 beats, occasional PVCs, HR rate 54 to 152bpm, average 84bpm. Dr. Radford Pax recommended Toprol XL 25mg  daily. She does recall being on propranolol many years ago for performance anxiety. Calcium score was zero for coronary calcium but did show aortic atherosclerosis.   Last seen by Melina Copa, PA back in April by a telehealth visit. Was felt to be doing well. Had had her COVID vaccines. Palpitations had improved. 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   Comes in today. Here with   Past Medical History:  Diagnosis Date  . Anxiety   . Apocrine metaplasia of breast, right 03/03/2018  . Arthritis   . Atrial tachycardia, paroxysmal ([place])    noted on event monitor 02/2020  . Breast mass, right 04/12/2017  . Cervical disc disease   . Complication of anesthesia    very sensitive to all mdications,needs lower dosage  . Depression   . Hypertension   . Hypothyroidism 03/17/2018  . Neuromuscular disorder (HCC)    numbness in 2 fingers of left hand  . Perimenopausal disorder 03/17/2018  . Premature atrial  contractions   . PVC's (premature ventricular contractions)    noted on heart monitor 02/2020  . Routine general medical examination at a health care facility 04/06/2016  . Tubular adenoma of colon 04/27/2015   04/07/2015 @ 70 cm ,polyp; Dr Collene Mares Due 2019     Past Surgical History:  Procedure Laterality Date  . APPENDECTOMY    . BLADDER REPAIR    . BREAST LUMPECTOMY WITH RADIOACTIVE SEED LOCALIZATION Right 02/10/2017   Procedure: RIGHT BREAST LUMPECTOMY WITH RADIOACTIVE SEED LOCALIZATION;  Surgeon: Autumn Messing III, MD;  Location: [facility];  Service: General;  Laterality: Right;  . CESAREAN SECTION     X 2, Dr Reino Kent  . EYE SURGERY Bilateral    Lasik   . TUBAL LIGATION     bladder laceration repair     Medications: No outpatient medications have been marked as taking for the 07/07/20 encounter (Appointment) with Burtis Junes, NP.     Allergies: Allergies  Allergen Reactions  . Benzodiazepines Other (See Comments)    Patient needed crash cart after tubal ligation  . Other Other (See Comments)    Patient is extremely sensitive to all medicines, maybe needs 1/4 or 1/8 dosage of normal adult  . Augmentin [Amoxicillin-Pot Clavulanate] Other (See Comments)  . Lamictal [Lamotrigine] Rash  . Penicillins Rash    Rash because of a history of documented adverse serious drug reaction;Medi Alert bracelet  is recommended (CHILDHOOD REACTION) Has patient had a PCN reaction causing  immediate rash, facial/tongue/throat swelling, SOB or lightheadedness with hypotension:UNKNOWN Has patient had a PCN reaction causing severe rash involving mucus membranes or skin necrosis:unsure Has patient had a PCN reaction that required hospitalization:unsure Has patient had a PCN reaction occurring within the last 10 years:unsure     Social History: The patient  reports that she has never smoked. She has never used smokeless tobacco. She reports current alcohol use. She reports that she does not use drugs.     Family History: The patient's ***family history includes Breast cancer in her paternal grandmother; Cancer in her paternal grandfather; Diabetes in her father; Heart disease in her father and paternal grandmother; Hypertension in her father and mother; Mitral valve prolapse in her maternal grandmother; Other in her mother and sister; Stroke (age of onset: 44) in her maternal grandfather.   Review of Systems: Please see the history of present illness.   All other systems are reviewed and negative.   Physical Exam: VS:  LMP 10/10/2013  .  BMI There is no height or weight on file to calculate BMI.  Wt Readings from Last 3 Encounters:  04/11/20 142 lb (64.4 kg)  11/21/19 142 lb (64.4 kg)  10/23/19 145 lb (65.8 kg)    General: Pleasant. Well developed, well nourished and in no acute distress.   HEENT: Normal.  Neck: Supple, no JVD, carotid bruits, or masses noted.  Cardiac: ***Regular rate and rhythm. No murmurs, rubs, or gallops. No edema.  Respiratory:  Lungs are clear to auscultation bilaterally with normal work of breathing.  GI: Soft and nontender.  MS: No deformity or atrophy. Gait and ROM intact.  Skin: Warm and dry. Color is normal.  Neuro:  Strength and sensation are intact and no gross focal deficits noted.  Psych: Alert, appropriate and with normal affect.   LABORATORY DATA:  EKG:  EKG {ACTION; IS/IS UMP:[id number]} ordered today.  Personally reviewed by me. This demonstrates ***.  Lab Results  Component Value Date   WBC 9.0 10/23/2019   HGB 14.3 10/23/2019   HCT 42.9 10/23/2019   PLT 284.0 10/23/2019   GLUCOSE 97 10/23/2019   CHOL 165 07/23/2019   TRIG 65.0 07/23/2019   HDL 63.60 07/23/2019   LDLCALC 88 07/23/2019   ALT 23 10/23/2019   AST 17 10/23/2019   NA 139 10/23/2019   K 3.8 10/23/2019   CL 102 10/23/2019   CREATININE 0.71 10/23/2019   BUN 18 10/23/2019   CO2 29 10/23/2019   TSH 3.02 10/23/2019     BNP (last 3 results) No results for input(s): BNP  in the last 8760 hours.  ProBNP (last 3 results) No results for input(s): PROBNP in the last 8760 hours.   Other Studies Reviewed Today:   Prior CV studies:   The following studies were reviewed today:  2D echo 11/2019 1. Left ventricular ejection fraction, by visual estimation, is 60 to  65%. The left ventricle has normal function. There is mildly increased  asymmetric left ventricular hypertrophy.  2. The left ventricle has no regional wall motion abnormalities.  3. Left ventricular diastolic parameters are indeterminate.  4. Global right ventricle has normal systolic function.The right  ventricular size is normal.  5. Left atrial size was normal.  6. Right atrial size was normal.  7. The mitral valve is normal in structure. No evidence of mitral valve  regurgitation.  8. The tricuspid valve is normal in structure. Tricuspid valve  regurgitation is not demonstrated.  9. The aortic valve is tricuspid.  Aortic valve regurgitation is mild. No  evidence of aortic valve sclerosis or stenosis.  10. The pulmonic valve was not well visualized. Pulmonic valve  regurgitation is trivial.  11. TR signal is inadequate for assessing pulmonary artery systolic  pressure.  12. The inferior vena cava is normal in size with greater than 50%  respiratory variability, suggesting right atrial pressure of 3 mmHg.  Coronary CTA 11/2019 IMPRESSION: Coronary calcium score of 0. This was 0 percentile for age and sex matched control. Traci Turner  IMPRESSION: 1. No acute findings in the imaged extracardiac chest. 2. Aortic Atherosclerosis (ICD10-I70.0).  Event Monitor Resulted 02/2020  Sinus bradycardia, normal sinus rhythm and sinus tachycardia. The average heart rate was 84bpm and ranged from 54 to 152bpm.  Occasional PACs and nonsustained atrial tachycardia up to 4 beats.  Occasional PVCs.      ASSESSMENT & PLAN:    1. PVCs/PACs - noted on monitor, likely the etiology  of her palpitations. Will re-route metoprolol rx to her preferred pharmacy. Asked her to f/u BP and HR over the next week and relay those readings via MyChart (link sent to patient while on phone per our discussion).  2. Nonsustained atrial tachycardia - plan to monitor response with metoprolol. 3. Aortic atherosclerosis - discussed goal BP <130 and LDL <70. Last LDL was above goal so would suggest low dose statin initiation for risk reduction. She prefers to only make one medication change at a time because she has been sensitive to medications in the past. Per our discussion we will have her come in for fasting labs to reassess more current LDL. 4. Essential HTN - unable to check vitals today but she will look into fixing her device. Instructed to obtain BP/HR for 1 week after initiation of metoprolol. She plans to send those readings in via MyChart.  5. LVH - asymmetric LVH noted on echo. She was concerned about this finding on her report. Discussed echo result today. We also discussed goal blood pressures as it pertains to this finding - would aim for goal BP <130/80. We will await her blood pressure readings as above. This is of no hemodynamic consequence otherwise at this time.    Current medicines are reviewed with the patient today.  The patient does not have concerns regarding medicines other than what has been noted above.  The following changes have been made:  See above.  Labs/ tests ordered today include:   No orders of the defined types were placed in this encounter.    Disposition:   FU with *** in {gen number 2-62:[id number]} {Days to years:10300}.   Patient is agreeable to this plan and will call if any problems develop in the interim.   SignedTruitt Merle, NP  07/01/2020 8:40 AM  [facility] [address] [place], [place]  [address] Phone: [phone] Fax: [phone]

## 2020-07-07 ENCOUNTER — Ambulatory Visit: Payer: Self-pay | Admitting: Nurse Practitioner

## 2020-07-11 ENCOUNTER — Emergency Department (HOSPITAL_COMMUNITY): Payer: BC Managed Care – PPO

## 2020-07-11 ENCOUNTER — Other Ambulatory Visit: Payer: Self-pay

## 2020-07-11 ENCOUNTER — Emergency Department (HOSPITAL_COMMUNITY): Payer: BC Managed Care – PPO | Admitting: Anesthesiology

## 2020-07-11 ENCOUNTER — Encounter (HOSPITAL_COMMUNITY)
Admission: EM | Disposition: A | Discharge status: Home / Self Care | Payer: Self-pay | Source: Home / Self Care | Attending: Emergency Medicine

## 2020-07-11 ENCOUNTER — Encounter (HOSPITAL_COMMUNITY): Payer: Self-pay | Admitting: Orthopedic Surgery

## 2020-07-11 ENCOUNTER — Ambulatory Visit (HOSPITAL_COMMUNITY)
Admission: EM | Admit: 2020-07-11 | Discharge: 2020-07-11 | Disposition: A | Discharge status: Home / Self Care | Payer: BC Managed Care – PPO | Attending: Emergency Medicine | Admitting: Emergency Medicine

## 2020-07-11 DIAGNOSIS — E039 Hypothyroidism, unspecified: Secondary | ICD-10-CM | POA: Diagnosis not present

## 2020-07-11 DIAGNOSIS — R1084 Generalized abdominal pain: Secondary | ICD-10-CM | POA: Diagnosis not present

## 2020-07-11 DIAGNOSIS — Z8249 Family history of ischemic heart disease and other diseases of the circulatory system: Secondary | ICD-10-CM | POA: Diagnosis not present

## 2020-07-11 DIAGNOSIS — K810 Acute cholecystitis: Secondary | ICD-10-CM | POA: Diagnosis not present

## 2020-07-11 DIAGNOSIS — Z881 Allergy status to other antibiotic agents status: Secondary | ICD-10-CM | POA: Diagnosis not present

## 2020-07-11 DIAGNOSIS — F418 Other specified anxiety disorders: Secondary | ICD-10-CM | POA: Diagnosis not present

## 2020-07-11 DIAGNOSIS — F329 Major depressive disorder, single episode, unspecified: Secondary | ICD-10-CM | POA: Insufficient documentation

## 2020-07-11 DIAGNOSIS — R1012 Left upper quadrant pain: Secondary | ICD-10-CM | POA: Diagnosis not present

## 2020-07-11 DIAGNOSIS — K8010 Calculus of gallbladder with chronic cholecystitis without obstruction: Secondary | ICD-10-CM | POA: Insufficient documentation

## 2020-07-11 DIAGNOSIS — I10 Essential (primary) hypertension: Secondary | ICD-10-CM | POA: Insufficient documentation

## 2020-07-11 DIAGNOSIS — Z20822 Contact with and (suspected) exposure to covid-19: Secondary | ICD-10-CM | POA: Diagnosis not present

## 2020-07-11 DIAGNOSIS — Z9049 Acquired absence of other specified parts of digestive tract: Secondary | ICD-10-CM | POA: Insufficient documentation

## 2020-07-11 DIAGNOSIS — J90 Pleural effusion, not elsewhere classified: Secondary | ICD-10-CM | POA: Diagnosis not present

## 2020-07-11 DIAGNOSIS — Z880 Allergy status to penicillin: Secondary | ICD-10-CM | POA: Diagnosis not present

## 2020-07-11 DIAGNOSIS — Z79899 Other long term (current) drug therapy: Secondary | ICD-10-CM | POA: Diagnosis not present

## 2020-07-11 DIAGNOSIS — R0789 Other chest pain: Secondary | ICD-10-CM | POA: Diagnosis not present

## 2020-07-11 DIAGNOSIS — R001 Bradycardia, unspecified: Secondary | ICD-10-CM | POA: Diagnosis not present

## 2020-07-11 DIAGNOSIS — M1990 Unspecified osteoarthritis, unspecified site: Secondary | ICD-10-CM | POA: Insufficient documentation

## 2020-07-11 DIAGNOSIS — R079 Chest pain, unspecified: Secondary | ICD-10-CM | POA: Diagnosis not present

## 2020-07-11 DIAGNOSIS — K7689 Other specified diseases of liver: Secondary | ICD-10-CM | POA: Insufficient documentation

## 2020-07-11 DIAGNOSIS — R1011 Right upper quadrant pain: Secondary | ICD-10-CM

## 2020-07-11 DIAGNOSIS — F419 Anxiety disorder, unspecified: Secondary | ICD-10-CM | POA: Insufficient documentation

## 2020-07-11 DIAGNOSIS — K8020 Calculus of gallbladder without cholecystitis without obstruction: Secondary | ICD-10-CM | POA: Diagnosis not present

## 2020-07-11 HISTORY — PX: CHOLECYSTECTOMY: SHX55

## 2020-07-11 LAB — CBC
HCT: 35.6 % — ABNORMAL LOW (ref 36.0–46.0)
Hemoglobin: 11.5 g/dL — ABNORMAL LOW (ref 12.0–15.0)
MCH: 28.5 pg (ref 26.0–34.0)
MCHC: 32.3 g/dL (ref 30.0–36.0)
MCV: 88.3 fL (ref 80.0–100.0)
Platelets: 231 10*3/uL (ref 150–400)
RBC: 4.03 MIL/uL (ref 3.87–5.11)
RDW: 13 % (ref 11.5–15.5)
WBC: 9.2 10*3/uL (ref 4.0–10.5)
nRBC: 0 % (ref 0.0–0.2)

## 2020-07-11 LAB — TROPONIN I (HIGH SENSITIVITY)
Troponin I (High Sensitivity): 5 ng/L (ref <18)
Troponin I (High Sensitivity): 8 ng/L (ref <18)

## 2020-07-11 LAB — COMPREHENSIVE METABOLIC PANEL
ALT: 25 U/L (ref 0–44)
AST: 33 U/L (ref 15–41)
Albumin: 3.5 g/dL (ref 3.5–5.0)
Alkaline Phosphatase: 59 U/L (ref 38–126)
Anion gap: 9 (ref 5–15)
BUN: 15 mg/dL (ref 6–20)
CO2: 22 mmol/L (ref 22–32)
Calcium: 8.7 mg/dL — ABNORMAL LOW (ref 8.9–10.3)
Chloride: 109 mmol/L (ref 98–111)
Creatinine, Ser: 0.83 mg/dL (ref 0.44–1.00)
GFR calc Af Amer: >60 mL/min (ref >60)
GFR calc non Af Amer: >60 mL/min (ref >60)
Glucose, Bld: 129 mg/dL — ABNORMAL HIGH (ref 70–99)
Potassium: 3.6 mmol/L (ref 3.5–5.1)
Sodium: 140 mmol/L (ref 135–145)
Total Bilirubin: 0.2 mg/dL — ABNORMAL LOW (ref 0.3–1.2)
Total Protein: 5.4 g/dL — ABNORMAL LOW (ref 6.5–8.1)

## 2020-07-11 LAB — SARS CORONAVIRUS 2 BY RT PCR (HOSPITAL ORDER, PERFORMED IN ~~LOC~~ HOSPITAL LAB): SARS Coronavirus 2: NEGATIVE

## 2020-07-11 LAB — LIPASE, BLOOD: Lipase: 48 U/L (ref 11–51)

## 2020-07-11 SURGERY — LAPAROSCOPIC CHOLECYSTECTOMY
Anesthesia: General | Site: Abdomen

## 2020-07-11 MED ORDER — ACETAMINOPHEN 325 MG PO TABS
650.0000 mg | ORAL_TABLET | Freq: Four times a day (QID) | ORAL | 2 refills | Status: DC | PRN
Start: 2020-07-11 — End: 2022-01-11

## 2020-07-11 MED ORDER — 0.9 % SODIUM CHLORIDE (POUR BTL) OPTIME
TOPICAL | Status: DC | PRN
  Administered 2020-07-11: 1000 mL

## 2020-07-11 MED ORDER — FENTANYL CITRATE (PF) 250 MCG/5ML IJ SOLN
INTRAMUSCULAR | Status: AC
  Filled 2020-07-11: qty 5

## 2020-07-11 MED ORDER — ACETAMINOPHEN 10 MG/ML IV SOLN
INTRAVENOUS | Status: AC
  Filled 2020-07-11: qty 100

## 2020-07-11 MED ORDER — SODIUM CHLORIDE 0.9% FLUSH: 3.0000 mL | Freq: Once | INTRAVENOUS | Status: DC

## 2020-07-11 MED ORDER — ROCURONIUM BROMIDE 10 MG/ML (PF) SYRINGE
PREFILLED_SYRINGE | INTRAVENOUS | Status: DC | PRN
  Administered 2020-07-11: 70 mg via INTRAVENOUS

## 2020-07-11 MED ORDER — DOCUSATE SODIUM 100 MG PO CAPS
100.0000 mg | ORAL_CAPSULE | Freq: Two times a day (BID) | ORAL | 0 refills | Status: DC | PRN
Start: 2020-07-11 — End: 2021-03-09

## 2020-07-11 MED ORDER — MEPERIDINE HCL 25 MG/ML IJ SOLN: 6.2500 mg | INTRAMUSCULAR | Status: DC | PRN

## 2020-07-11 MED ORDER — ONDANSETRON HCL 4 MG/2ML IJ SOLN
INTRAMUSCULAR | Status: DC | PRN
  Administered 2020-07-11: 4 mg via INTRAVENOUS

## 2020-07-11 MED ORDER — OXYCODONE HCL 5 MG PO TABS: 5.0000 mg | ORAL_TABLET | Freq: Four times a day (QID) | ORAL | 0 refills | Status: DC | PRN

## 2020-07-11 MED ORDER — SODIUM CHLORIDE 0.9 % IV BOLUS
1000.0000 mL | Freq: Once | INTRAVENOUS | Status: AC
  Administered 2020-07-11: 1000 mL via INTRAVENOUS

## 2020-07-11 MED ORDER — HYDROMORPHONE HCL 1 MG/ML IJ SOLN
0.5000 mg | Freq: Once | INTRAMUSCULAR | Status: AC
  Administered 2020-07-11: 0.5 mg via INTRAVENOUS
  Filled 2020-07-11: qty 1

## 2020-07-11 MED ORDER — ACETAMINOPHEN 10 MG/ML IV SOLN
INTRAVENOUS | Status: DC | PRN
Start: 2020-07-11 — End: 2020-07-11
  Administered 2020-07-11: 1000 mg via INTRAVENOUS

## 2020-07-11 MED ORDER — SUGAMMADEX SODIUM 200 MG/2ML IV SOLN
INTRAVENOUS | Status: DC | PRN
  Administered 2020-07-11: 125 mg via INTRAVENOUS

## 2020-07-11 MED ORDER — ONDANSETRON HCL 4 MG/2ML IJ SOLN
INTRAMUSCULAR | Status: AC
  Filled 2020-07-11: qty 2

## 2020-07-11 MED ORDER — IOHEXOL 300 MG/ML  SOLN
100.0000 mL | Freq: Once | INTRAMUSCULAR | Status: AC | PRN
  Administered 2020-07-11: 100 mL via INTRAVENOUS

## 2020-07-11 MED ORDER — PHENYLEPHRINE 40 MCG/ML (10ML) SYRINGE FOR IV PUSH (FOR BLOOD PRESSURE SUPPORT)
PREFILLED_SYRINGE | INTRAVENOUS | Status: AC
  Filled 2020-07-11: qty 10

## 2020-07-11 MED ORDER — EPHEDRINE 5 MG/ML INJ
INTRAVENOUS | Status: AC
  Filled 2020-07-11: qty 10

## 2020-07-11 MED ORDER — CHLORHEXIDINE GLUCONATE 0.12 % MT SOLN
OROMUCOSAL | Status: AC
  Administered 2020-07-11: 15 mL via OROMUCOSAL
  Filled 2020-07-11: qty 15

## 2020-07-11 MED ORDER — MIDAZOLAM HCL 2 MG/2ML IJ SOLN
INTRAMUSCULAR | Status: AC
  Filled 2020-07-11: qty 2

## 2020-07-11 MED ORDER — ONDANSETRON HCL 4 MG/2ML IJ SOLN
4.0000 mg | Freq: Once | INTRAMUSCULAR | Status: AC | PRN
  Administered 2020-07-11: 4 mg via INTRAVENOUS

## 2020-07-11 MED ORDER — SCOPOLAMINE 1 MG/3DAYS TD PT72: 1.0000 | MEDICATED_PATCH | TRANSDERMAL | Status: DC

## 2020-07-11 MED ORDER — BUPIVACAINE HCL (PF) 0.25 % IJ SOLN
INTRAMUSCULAR | Status: AC
  Filled 2020-07-11: qty 30

## 2020-07-11 MED ORDER — DEXAMETHASONE SODIUM PHOSPHATE 10 MG/ML IJ SOLN
INTRAMUSCULAR | Status: DC | PRN
  Administered 2020-07-11: 5 mg via INTRAVENOUS

## 2020-07-11 MED ORDER — SCOPOLAMINE 1 MG/3DAYS TD PT72
MEDICATED_PATCH | TRANSDERMAL | Status: AC
  Administered 2020-07-11: 1.5 mg via TRANSDERMAL
  Filled 2020-07-11: qty 1

## 2020-07-11 MED ORDER — SODIUM CHLORIDE 0.9 % IV SOLN
2.0000 g | Freq: Once | INTRAVENOUS | Status: AC
  Administered 2020-07-11: 2 g via INTRAVENOUS
  Filled 2020-07-11: qty 20

## 2020-07-11 MED ORDER — LIDOCAINE 2% (20 MG/ML) 5 ML SYRINGE
INTRAMUSCULAR | Status: DC | PRN
  Administered 2020-07-11: 100 mg via INTRAVENOUS

## 2020-07-11 MED ORDER — HEMOSTATIC AGENTS (NO CHARGE) OPTIME
TOPICAL | Status: DC | PRN
  Administered 2020-07-11: 1 via TOPICAL

## 2020-07-11 MED ORDER — HYDROMORPHONE HCL 1 MG/ML IJ SOLN: 0.2500 mg | INTRAMUSCULAR | Status: DC | PRN

## 2020-07-11 MED ORDER — SODIUM CHLORIDE 0.9 % IR SOLN
Status: DC | PRN
  Administered 2020-07-11: 1000 mL

## 2020-07-11 MED ORDER — CHLORHEXIDINE GLUCONATE 0.12 % MT SOLN: 15.0000 mL | Freq: Once | OROMUCOSAL | Status: AC

## 2020-07-11 MED ORDER — MIDAZOLAM HCL 2 MG/2ML IJ SOLN
INTRAMUSCULAR | Status: DC | PRN
  Administered 2020-07-11: 2 mg via INTRAVENOUS

## 2020-07-11 MED ORDER — LACTATED RINGERS IV SOLN: INTRAVENOUS | Status: DC | PRN

## 2020-07-11 MED ORDER — FENTANYL CITRATE (PF) 250 MCG/5ML IJ SOLN
INTRAMUSCULAR | Status: DC | PRN
  Administered 2020-07-11: 100 ug via INTRAVENOUS

## 2020-07-11 MED ORDER — EPHEDRINE SULFATE-NACL 50-0.9 MG/10ML-% IV SOSY
PREFILLED_SYRINGE | INTRAVENOUS | Status: DC | PRN
  Administered 2020-07-11 (×2): 10 mg via INTRAVENOUS

## 2020-07-11 MED ORDER — PROPOFOL 10 MG/ML IV BOLUS
INTRAVENOUS | Status: AC
  Filled 2020-07-11: qty 20

## 2020-07-11 MED ORDER — BUPIVACAINE HCL (PF) 0.25 % IJ SOLN
INTRAMUSCULAR | Status: DC | PRN
  Administered 2020-07-11: 9 mL

## 2020-07-11 MED ORDER — PROPOFOL 10 MG/ML IV BOLUS
INTRAVENOUS | Status: DC | PRN
  Administered 2020-07-11: 100 mg via INTRAVENOUS

## 2020-07-11 MED ORDER — KETOROLAC TROMETHAMINE 15 MG/ML IJ SOLN
15.0000 mg | Freq: Once | INTRAMUSCULAR | Status: AC
  Administered 2020-07-11: 15 mg via INTRAVENOUS
  Filled 2020-07-11: qty 1

## 2020-07-11 MED ORDER — PHENYLEPHRINE 40 MCG/ML (10ML) SYRINGE FOR IV PUSH (FOR BLOOD PRESSURE SUPPORT)
PREFILLED_SYRINGE | INTRAVENOUS | Status: DC | PRN
  Administered 2020-07-11: 80 ug via INTRAVENOUS
  Administered 2020-07-11: 120 ug via INTRAVENOUS
  Administered 2020-07-11: 80 ug via INTRAVENOUS

## 2020-07-11 MED ORDER — LACTATED RINGERS IV SOLN: INTRAVENOUS | Status: DC

## 2020-07-11 SURGICAL SUPPLY — 53 items
APL PRP STRL LF DISP 70% ISPRP (MISCELLANEOUS) ×2
APL SKNCLS STERI-STRIP NONHPOA (GAUZE/BANDAGES/DRESSINGS) ×2
APPLIER CLIP ROT 10 11.4 M/L (STAPLE) ×4
APR CLP MED LRG 11.4X10 (STAPLE) ×2
BAG SPEC RTRVL 10 TROC 200 (ENDOMECHANICALS) ×2
BAG SPEC RTRVL LRG 6X4 10 (ENDOMECHANICALS)
BENZOIN TINCTURE PRP APPL 2/3 (GAUZE/BANDAGES/DRESSINGS) ×4 IMPLANT
BLADE CLIPPER SURG (BLADE) IMPLANT
CANISTER SUCT 3000ML PPV (MISCELLANEOUS) ×4 IMPLANT
CHLORAPREP W/TINT 26 (MISCELLANEOUS) ×4 IMPLANT
CLIP APPLIE ROT 10 11.4 M/L (STAPLE) ×2 IMPLANT
CLOSURE STERI-STRIP 1/4X4 (GAUZE/BANDAGES/DRESSINGS) ×3 IMPLANT
CLOSURE WOUND 1/2 X4 (GAUZE/BANDAGES/DRESSINGS)
COVER MAYO STAND STRL (DRAPES) ×1 IMPLANT
COVER SURGICAL LIGHT HANDLE (MISCELLANEOUS) ×4 IMPLANT
COVER WAND RF STERILE (DRAPES) ×4 IMPLANT
DRAPE C-ARM 42X120 X-RAY (DRAPES) ×1 IMPLANT
DRSG TEGADERM 2-3/8X2-3/4 SM (GAUZE/BANDAGES/DRESSINGS) ×6 IMPLANT
DRSG TEGADERM 4X4.75 (GAUZE/BANDAGES/DRESSINGS) ×4 IMPLANT
ELECT REM PT RETURN 9FT ADLT (ELECTROSURGICAL) ×4
ELECTRODE REM PT RTRN 9FT ADLT (ELECTROSURGICAL) ×2 IMPLANT
GAUZE SPONGE 2X2 8PLY STRL LF (GAUZE/BANDAGES/DRESSINGS) ×2 IMPLANT
GLOVE BIO SURGEON STRL SZ7 (GLOVE) ×4 IMPLANT
GLOVE BIOGEL PI IND STRL 7.5 (GLOVE) ×2 IMPLANT
GLOVE BIOGEL PI INDICATOR 7.5 (GLOVE) ×2
GOWN STRL REUS W/ TWL LRG LVL3 (GOWN DISPOSABLE) ×6 IMPLANT
GOWN STRL REUS W/TWL LRG LVL3 (GOWN DISPOSABLE) ×12
HEMOSTAT SNOW SURGICEL 2X4 (HEMOSTASIS) ×3 IMPLANT
KIT BASIN OR (CUSTOM PROCEDURE TRAY) ×4 IMPLANT
KIT IMAGING PINPOINTPAQ (MISCELLANEOUS) ×3 IMPLANT
KIT TURNOVER KIT B (KITS) ×4 IMPLANT
NS IRRIG 1000ML POUR BTL (IV SOLUTION) ×4 IMPLANT
PAD ARMBOARD 7.5X6 YLW CONV (MISCELLANEOUS) ×4 IMPLANT
POUCH RETRIEVAL ECOSAC 10 (ENDOMECHANICALS) ×1 IMPLANT
POUCH RETRIEVAL ECOSAC 10MM (ENDOMECHANICALS) ×4
POUCH SPECIMEN RETRIEVAL 10MM (ENDOMECHANICALS) IMPLANT
SCISSORS LAP 5X35 DISP (ENDOMECHANICALS) ×4 IMPLANT
SET CHOLANGIOGRAPH 5 50 .035 (SET/KITS/TRAYS/PACK) ×4 IMPLANT
SET IRRIG TUBING LAPAROSCOPIC (IRRIGATION / IRRIGATOR) ×4 IMPLANT
SET TUBE SMOKE EVAC HIGH FLOW (TUBING) ×4 IMPLANT
SLEEVE ENDOPATH XCEL 5M (ENDOMECHANICALS) ×4 IMPLANT
SPECIMEN JAR SMALL (MISCELLANEOUS) ×4 IMPLANT
SPONGE GAUZE 2X2 STER 10/PKG (GAUZE/BANDAGES/DRESSINGS) ×2
STRIP CLOSURE SKIN 1/2X4 (GAUZE/BANDAGES/DRESSINGS) ×1 IMPLANT
SUT MNCRL AB 4-0 PS2 18 (SUTURE) ×4 IMPLANT
SUT VICRYL 0 UR6 27IN ABS (SUTURE) ×3 IMPLANT
TOWEL GREEN STERILE (TOWEL DISPOSABLE) ×4 IMPLANT
TOWEL GREEN STERILE FF (TOWEL DISPOSABLE) ×4 IMPLANT
TRAY LAPAROSCOPIC MC (CUSTOM PROCEDURE TRAY) ×4 IMPLANT
TROCAR XCEL BLUNT TIP 100MML (ENDOMECHANICALS) ×4 IMPLANT
TROCAR XCEL NON-BLD 11X100MML (ENDOMECHANICALS) ×4 IMPLANT
TROCAR XCEL NON-BLD 5MMX100MML (ENDOMECHANICALS) ×4 IMPLANT
WATER STERILE IRR 1000ML POUR (IV SOLUTION) ×4 IMPLANT

## 2020-07-11 NOTE — Op Note (Signed)
Laparoscopic Cholecystectomy Procedure Note  Indications: This patient presents with acute cholecystitis and will undergo laparoscopic cholecystectomy.  Pre-operative Diagnosis: Calculus of gallbladder with acute cholecystitis, without mention of obstruction  Post-operative Diagnosis: Same  Surgeon: Maia Petties   Assistants: none  Anesthesia: General endotracheal anesthesia  ASA Class: 2  Procedure Details  The patient was seen again in the Holding Room. The risks, benefits, complications, treatment options, and expected outcomes were discussed with the patient. The possibilities of reaction to medication, pulmonary aspiration, perforation of viscus, bleeding, recurrent infection, finding a normal gallbladder, the need for additional procedures, failure to diagnose a condition, the possible need to convert to an open procedure, and creating a complication requiring transfusion or operation were discussed with the patient. The likelihood of improving the patient's symptoms with return to their baseline status is good.  The patient and/or family concurred with the proposed plan, giving informed consent. The site of surgery properly noted. The patient was taken to Operating Room, identified as Stephanie Gray and the procedure verified as Laparoscopic Cholecystectomy with Intraoperative Cholangiogram. A Time Out was held and the above information confirmed.  Prior to the induction of general anesthesia, antibiotic prophylaxis was administered.  ICG was administered in the pre-op holding area. General endotracheal anesthesia was then administered and tolerated well. After the induction, the abdomen was prepped with Chloraprep and draped in sterile fashion. The patient was positioned in the supine position.  Local anesthetic agent was injected into the skin near the umbilicus and an incision made. We dissected down to the abdominal fascia with blunt dissection.  The fascia was incised  vertically and we entered the peritoneal cavity bluntly.  A pursestring suture of 0-Vicryl was placed around the fascial opening.  The Hasson cannula was inserted and secured with the stay suture.  Pneumoperitoneum was then created with CO2 and tolerated well without any adverse changes in the patient's vital signs. An 11-mm port was placed in the subxiphoid position.  Two 5-mm ports were placed in the right upper quadrant. All skin incisions were infiltrated with a local anesthetic agent before making the incision and placing the trocars.   We positioned the patient in reverse Trendelenburg, tilted slightly to the patient's left.  The gallbladder was identified, the fundus grasped and retracted cephalad. The gallbladder is quite distended with some omental adhesions.  Adhesions were lysed bluntly and with the electrocautery where indicated, taking care not to injure any adjacent organs or viscus. The infundibulum was grasped and retracted laterally, exposing the peritoneum overlying the triangle of Calot. This was then divided and exposed in a blunt fashion. The ICG-mode on the laparoscope was used to identify the cystic duct. The cystic duct was clearly identified and bluntly dissected circumferentially. A critical view of the cystic duct and cystic artery was obtained.  The cystic duct was then ligated with clips and divided. The cystic artery was, dissected free, ligated with clips and divided as well.   The gallbladder was dissected from the liver bed in retrograde fashion with the electrocautery. The gallbladder was removed and placed in an Ecosac. The liver bed was irrigated and inspected. Hemostasis was achieved with the electrocautery and SNOW. Copious irrigation was utilized and was repeatedly aspirated until clear.  The gallbladder and Eco sac were then removed through the umbilical port site.  The pursestring suture was used to close the umbilical fascia.    We again inspected the right upper  quadrant for hemostasis.  Pneumoperitoneum was released as  we removed the trocars.  4-0 Monocryl was used to close the skin.   Benzoin, steri-strips, and clean dressings were applied. The patient was then extubated and brought to the recovery room in stable condition. Instrument, sponge, and needle counts were correct at closure and at the conclusion of the case.   Findings: Cholecystitis with Cholelithiasis  Estimated Blood Loss: less than 50 mL         Drains: none         Specimens: Gallbladder           Complications: None; patient tolerated the procedure well.         Disposition: PACU - hemodynamically stable.         Condition: stable  Stephanie Burn. Georgette Dover, MD, [facility]  General/ Trauma Surgery   07/11/2020 3:51 PM

## 2020-07-11 NOTE — Transfer of Care (Signed)
Immediate Anesthesia Transfer of Care Note  Patient: Stephanie Gray  Procedure(s) Performed: LAPAROSCOPIC CHOLECYSTECTOMY (N/A Abdomen)  Patient Location: PACU  Anesthesia Type:MAC  Level of Consciousness: drowsy and patient cooperative  Airway & Oxygen Therapy: Patient Spontanous Breathing and Patient connected to nasal cannula oxygen  Post-op Assessment: Report given to RN and Post -op Vital signs reviewed and stable  Post vital signs: Reviewed and stable  Last Vitals:  Vitals Value Taken Time  BP 124/86 07/11/20 1606  Temp    Pulse 80 07/11/20 1608  Resp 8 07/11/20 1608  SpO2 100 % 07/11/20 1608  Vitals shown include unvalidated device data.  Last Pain:  Vitals:   07/11/20 1040  TempSrc:   PainSc: 1          Complications: No complications documented.

## 2020-07-11 NOTE — ED Notes (Signed)
UA, trop, CXR   Carlisle Cater, [place] 07/11/20 [id number]

## 2020-07-11 NOTE — ED Triage Notes (Signed)
Pt BIB [facility] from home c/o sudden LUQ abdominal 10/10 pain while sleeping. Pain radiating to chest and left back. S/S: nausea. Give ASA, x1 NTG, and 100 fent., with complete relief of chest pain and 4/10 abdominal pain. 4 zofran

## 2020-07-11 NOTE — Discharge Summary (Signed)
Physician Discharge Summary  Patient ID: Stephanie Gray MRN: [id number] DOB/AGE: 56-Sep-1967 56 y.o.  Admit date: 07/11/2020 Discharge date: 07/11/2020  Admission Diagnoses:  Acute cholecystitis with cholelithiasis Hypertension Hx PVC'S Hx anxiety/depression  Discharge Diagnoses:  Acute cholecystitis with cholelithiasis Hypertension Hx PVC'S Hx anxiety/depression  Active Problems: Acute cholecystitis/cholelithiasis   PROCEDURES: Laparoscopic cholecystectomy, 07/11/20  Hospital Course:  54 y.o.femalewith anxiety, depression, cervical disc disease, sensitivity to medications, HTN, hx palatations, nonsustained atrial tachycardia, PACs/PVCs, LVH.  She presented to the ED this a.m. with complaints of sudden left upper quadrant abdominal pain lasted about 1-1/2 hours that started while she was sleeping.  She had no nausea, vomiting, or diarrhea pain radiated to her chest and her back she was treated with aspirin, nitroglycerin, fentanyl by EMS; with complete relief of her chest pain and 4/10 abdominal pain. She says in retrospect she may have has something similar to this in the past but she ignored it.  She could not tell me if it was related to PO intake or not.    She has a past medical history of C-section and appendectomy. Currently pain on the left is better, she has some midepigastric discomfort, and is tender to palpation RUQ.  Work-up in the ED shows she is afebrile vital signs are stable.  CMP shows a glucose of 129, calcium of 8.7, total protein 5.4, total bilirubin 0.2, lipase 48.  Remainder of the CMP is normal.  Troponins are negative x2.  WBC is 9.2, hemoglobin 11.5, hematocrit 35.6, platelets 231,000.  Covid is negative.  Chest x-ray shows no active cardiopulmonary disease.  CT of the abdomen pelvis with contrast shows some dependent atelectasis at the lung bases.  Some hepatic cyst were present, multiple gallstones including 1 in the neck of the gallbladder.  Mild  gallbladder wall thickening, no biliary dilatation.  The pancreas appeared normal.  Abdominal ultrasound shows multiple gallstones the largest 1.2 cm.  Gallbladder wall thickening and distention no pericholecystic fluid is identified.  Common bile duct is 4 mm with no intrahepatic or extrahepatic biliary dilatation identified.  Impression was cholelithiasis with gallbladder wall thickening compatible with acute cholecystitis.   Pt was seen in the ED and taken to the OR by Dr. Georgette Dover.  She did well and we anticipate discharge from the PACU. Follow up as listed below.  CBC Latest Ref Rng & Units 07/11/2020 10/23/2019 07/23/2019  WBC 4.0 - 10.5 K/uL 9.2 9.0 6.7  Hemoglobin 12.0 - 15.0 g/dL 11.5(L) 14.3 13.4  Hematocrit 36 - 46 % 35.6(L) 42.9 39.5  Platelets 150 - 400 K/uL 231 284.0 242.0   CMP Latest Ref Rng & Units 07/11/2020 10/23/2019 07/23/2019  Glucose 70 - 99 mg/dL 129(H) 97 97  BUN 6 - 20 mg/dL 15 18 15   Creatinine 0.44 - 1.00 mg/dL 0.83 0.71 0.75  Sodium 135 - 145 mmol/L 140 139 140  Potassium 3.5 - 5.1 mmol/L 3.6 3.8 3.9  Chloride 98 - 111 mmol/L 109 102 103  CO2 22 - 32 mmol/L 22 29 27   Calcium 8.9 - 10.3 mg/dL 8.7(L) 9.2 9.3  Total Protein 6.5 - 8.1 g/dL 5.4(L) 7.4 7.0  Total Bilirubin 0.3 - 1.2 mg/dL 0.2(L) 0.3 0.3  Alkaline Phos 38 - 126 U/L 59 61 69  AST 15 - 41 U/L 33 17 15  ALT 0 - 44 U/L 25 23 15    Disposition: Discharge disposition: 01-Home or Self Care        Allergies as of 07/11/2020  Reactions   Benzodiazepines Other (See Comments)   Patient needed crash cart after tubal ligation   Other Other (See Comments)   Patient is extremely sensitive to all medicines, maybe needs 1/4 or 1/8 dosage of normal adult   Augmentin [amoxicillin-pot Clavulanate] Other (See Comments)   Lamictal [lamotrigine] Rash   Penicillins Rash   Rash because of a history of documented adverse serious drug reaction;Medi Alert bracelet  is recommended (CHILDHOOD REACTION) Has patient had a  PCN reaction causing immediate rash, facial/tongue/throat swelling, SOB or lightheadedness with hypotension:UNKNOWN Has patient had a PCN reaction causing severe rash involving mucus membranes or skin necrosis:unsure Has patient had a PCN reaction that required hospitalization:unsure Has patient had a PCN reaction occurring within the last 10 years:unsure      Medication List    STOP taking these medications   betamethasone valerate ointment 0.1 % Commonly known as: VALISONE   losartan 100 MG tablet Commonly known as: COZAAR     TAKE these medications   acetaminophen 325 MG tablet Commonly known as: Tylenol Take 2 tablets (650 mg total) by mouth every 6 (six) hours as needed.   docusate sodium 100 MG capsule Commonly known as: Colace Take 1 capsule (100 mg total) by mouth 2 (two) times daily as needed for mild constipation.   DULoxetine 60 MG capsule Commonly known as: CYMBALTA Take 1 capsule (60 mg total) by mouth daily.   metoprolol succinate 25 MG 24 hr tablet Commonly known as: Toprol XL Take 1 tablet (25 mg total) by mouth daily.   oxyCODONE 5 MG immediate release tablet Commonly known as: Roxicodone Take 1 tablet (5 mg total) by mouth every 6 (six) hours as needed for breakthrough pain.       Follow-up Information    [facility] Follow up on 07/29/2020.   Specialty: General Surgery Why: your appointment is at 11:30 AM.  Be at the office 30 minutes early for check in.  Bring photo ID and insurance information.   Contact information: [address] [place]  [address] [phone]               Signed: Earnstine Regal 07/11/2020, 3:54 PM

## 2020-07-11 NOTE — Discharge Instructions (Signed)
[facility] ______[facility] LAPAROSCOPIC SURGERY: POST OP INSTRUCTIONS No lifting over 20 pounds for 2 weeks.  You can ride again when it is comfortable for you. Always review your discharge instruction sheet given to you by the facility where your surgery was performed. IF YOU HAVE DISABILITY OR FAMILY LEAVE FORMS, YOU MUST BRING THEM TO THE OFFICE FOR PROCESSING.   DO NOT GIVE THEM TO YOUR DOCTOR.  1. A prescription for pain medication may be given to you upon discharge.  Take your pain medication as prescribed, if needed.  If narcotic pain medicine is not needed, then you may take acetaminophen (Tylenol) or ibuprofen (Advil) as needed. 2. Take your usually prescribed medications unless otherwise directed. 3. If you need a refill on your pain medication, please contact your pharmacy.  They will contact our office to request authorization. Prescriptions will not be filled after 5pm or on week-ends. 4. You should follow a light diet the first few days after arrival home, such as soup and crackers, etc.  Be sure to include lots of fluids daily. 5. Most patients will experience some swelling and bruising in the area of the incisions.  Ice packs will help.  Swelling and bruising can take several days to resolve.  6. It is common to experience some constipation if taking pain medication after surgery.  Increasing fluid intake and taking a stool softener (such as Colace) will usually help or prevent this problem from occurring.  A mild laxative (Milk of Magnesia or Miralax) should be taken according to package instructions if there are no bowel movements after 48 hours. 7. Unless discharge instructions indicate otherwise, you may remove your bandages 24-48 hours after surgery, and you may shower at that time.  You may have steri-strips (small skin tapes) in place directly over the incision.  These strips should be left on the skin for 7-10 days.  If your surgeon used skin glue on the incision,  you may shower in 24 hours.  The glue will flake off over the next 2-3 weeks.  Any sutures or staples will be removed at the office during your follow-up visit. 8. ACTIVITIES:  You may resume regular (light) daily activities beginning the next day--such as daily self-care, walking, climbing stairs--gradually increasing activities as tolerated.  You may have sexual intercourse when it is comfortable.  Refrain from any heavy lifting or straining until approved by your doctor. a. You may drive when you are no longer taking prescription pain medication, you can comfortably wear a seatbelt, and you can safely maneuver your car and apply brakes. b. RETURN TO WORK:  __________________________________________________________ 9. You should see your doctor in the office for a follow-up appointment approximately 2-3 weeks after your surgery.  Make sure that you call for this appointment within a day or two after you arrive home to insure a convenient appointment time. 10. OTHER INSTRUCTIONS: __________________________________________________________________________________________________________________________ __________________________________________________________________________________________________________________________ WHEN TO CALL YOUR DOCTOR: 1. Fever over 101.0 2. Inability to urinate 3. Continued bleeding from incision. 4. Increased pain, redness, or drainage from the incision. 5. Increasing abdominal pain  The clinic staff is available to answer your questions during regular business hours.  Please dont hesitate to call and ask to speak to one of the nurses for clinical concerns.  If you have a medical emergency, go to the nearest emergency room or call 911.  A surgeon from [facility] is always on call at the hospital. [address] [place], [place], [place]  [address] ?  [id number], [place], [place]   [address] [phone] ? [phone] ? FAX [phone] Web  site: [email or web address]

## 2020-07-11 NOTE — Anesthesia Preprocedure Evaluation (Signed)
Anesthesia Evaluation  Patient identified by MRN, date of birth, ID band Patient awake    Reviewed: Allergy & Precautions, NPO status , Patient's Chart, lab work & pertinent test results  Airway Mallampati: I  TM Distance: >3 FB Neck ROM: Full    Dental   Pulmonary    Pulmonary exam normal        Cardiovascular hypertension, Pt. on medications Normal cardiovascular exam     Neuro/Psych Anxiety Depression    GI/Hepatic   Endo/Other  Hypothyroidism   Renal/GU      Musculoskeletal   Abdominal   Peds  Hematology   Anesthesia Other Findings   Reproductive/Obstetrics                             Anesthesia Physical Anesthesia Plan  ASA: II  Anesthesia Plan: General   Post-op Pain Management:    Induction: Intravenous  PONV Risk Score and Plan: 3 and Midazolam, Dexamethasone and Ondansetron  Airway Management Planned: Oral ETT  Additional Equipment:   Intra-op Plan:   Post-operative Plan: Extubation in OR  Informed Consent: I have reviewed the patients History and Physical, chart, labs and discussed the procedure including the risks, benefits and alternatives for the proposed anesthesia with the patient or authorized representative who has indicated his/her understanding and acceptance.       Plan Discussed with: CRNA and Surgeon  Anesthesia Plan Comments:         Anesthesia Quick Evaluation

## 2020-07-11 NOTE — H&P (Addendum)
[facility] Admission Note  Stephanie Gray 1966/10/22  [id number].    Requesting MD: Carlisle Cater Chief Complaint:  Abdominal pain Reason for Consult: cholelithiasis Cardiologist: Dr. Golden Hurter  HPI: 54 y.o. female with anxiety, depression, cervical disc disease, sensitivity to medications, HTN, hx palatations, nonsustained atrial tachycardia, PACs/PVCs, LVH.  She presented to the ED this a.m. with complaints of sudden left upper quadrant abdominal pain lasted about 1-1/2 hours that started while she was sleeping.  She had no nausea, vomiting, or diarrhea pain radiated to her chest and her back she was treated with aspirin, nitroglycerin, fentanyl by EMS; with complete relief of her chest pain and 4/10 abdominal pain. She says in retrospect she may have has something similar to this in the past but she ignored it.  She could not tell me if it was related to PO intake or not.    She has a past medical history of C-section and appendectomy. Currently pain on the left is better, she has some midepigastric discomfort, and is tender to palpation RUQ.  Work-up in the ED shows she is afebrile vital signs are stable.  CMP shows a glucose of 129, calcium of 8.7, total protein 5.4, total bilirubin 0.2, lipase 48.  Remainder of the CMP is normal.  Troponins are negative x2.  WBC is 9.2, hemoglobin 11.5, hematocrit 35.6, platelets 231,000.  Covid is negative.  Chest x-ray shows no active cardiopulmonary disease.  CT of the abdomen pelvis with contrast shows some dependent atelectasis at the lung bases.  Some hepatic cyst were present, multiple gallstones including 1 in the neck of the gallbladder.  Mild gallbladder wall thickening, no biliary dilatation.  The pancreas appeared normal.  Abdominal ultrasound shows multiple gallstones the largest 1.2 cm.  Gallbladder wall thickening and distention no pericholecystic fluid is identified.  Common bile duct is 4 mm with no intrahepatic or  extrahepatic biliary dilatation identified.  Impression was cholelithiasis with gallbladder wall thickening compatible with acute cholecystitis.  We are asked to see.   ROS: Review of Systems  Constitutional: Negative.   HENT: Negative.   Eyes: Negative.   Respiratory: Negative.   Cardiovascular: Positive for palpitations (occasional PVC's).  Gastrointestinal: Positive for abdominal pain and heartburn. Negative for blood in stool, constipation, diarrhea, melena, nausea and vomiting.  Genitourinary: Negative.   Musculoskeletal: Positive for back pain.  Skin: Negative.   Neurological: Negative.   Endo/Heme/Allergies: Negative.   Psychiatric/Behavioral: Positive for depression. The patient is nervous/anxious.     Family History  Problem Relation Age of Onset  . Hypertension Mother   . Other Mother        congenital heart defect - bicuspid aortic valve  . Diabetes Father   . Heart disease Father   . Hypertension Father   . Stroke Maternal Grandfather 72       dementia  . Cancer Paternal Grandfather        testicular  . Mitral valve prolapse Maternal Grandmother   . Heart disease Paternal Grandmother   . Breast cancer Paternal Grandmother   . Other Sister        neurologic issue    Past Medical History:  Diagnosis Date  . Anxiety   . Apocrine metaplasia of breast, right 03/03/2018  . Arthritis   . Atrial tachycardia, paroxysmal ([place])    noted on event monitor 02/2020  . Breast mass, right 04/12/2017  . Cervical disc disease   . Complication of anesthesia    very sensitive to  all mdications,needs lower dosage  . Depression   . Hypertension   . Hypothyroidism 03/17/2018  . Neuromuscular disorder ([facility])    numbness in 2 fingers of left hand  . Perimenopausal disorder 03/17/2018  . Premature atrial contractions   . PVC's (premature ventricular contractions)    noted on heart monitor 02/2020  . Routine general medical examination at a health care facility 04/06/2016  .  Tubular adenoma of colon 04/27/2015   04/07/2015 @ 70 cm ,polyp; Dr Collene Mares Due 2019     Past Surgical History:  Procedure Laterality Date  . APPENDECTOMY    . BLADDER REPAIR    . BREAST LUMPECTOMY WITH RADIOACTIVE SEED LOCALIZATION Right 02/10/2017   Procedure: RIGHT BREAST LUMPECTOMY WITH RADIOACTIVE SEED LOCALIZATION;  Surgeon: Autumn Messing III, MD;  Location: [facility];  Service: General;  Laterality: Right;  . CESAREAN SECTION     X 2, Dr Reino Kent  . EYE SURGERY Bilateral    Lasik   . TUBAL LIGATION     bladder laceration repair    Social History:  reports that she has never smoked. She has never used smokeless tobacco. She reports current alcohol use. She reports that she does not use drugs. Tobacco:  None ETOH:  None Drugs:  None Retired Internal Medicine physician Married  Allergies:  Allergies  Allergen Reactions  . Benzodiazepines Other (See Comments)    Patient needed crash cart after tubal ligation  . Other Other (See Comments)    Patient is extremely sensitive to all medicines, maybe needs 1/4 or 1/8 dosage of normal adult  . Augmentin [Amoxicillin-Pot Clavulanate] Other (See Comments)  . Lamictal [Lamotrigine] Rash  . Penicillins Rash    Rash because of a history of documented adverse serious drug reaction;Medi Alert bracelet  is recommended (CHILDHOOD REACTION) Has patient had a PCN reaction causing immediate rash, facial/tongue/throat swelling, SOB or lightheadedness with hypotension:UNKNOWN Has patient had a PCN reaction causing severe rash involving mucus membranes or skin necrosis:unsure Has patient had a PCN reaction that required hospitalization:unsure Has patient had a PCN reaction occurring within the last 10 years:unsure     Prior to Admission medications   Medication Sig Start Date End Date Taking? Authorizing Provider  DULoxetine (CYMBALTA) 60 MG capsule Take 1 capsule (60 mg total) by mouth daily. 04/07/20  Yes Arfeen, Arlyce Harman, MD  metoprolol succinate (TOPROL  XL) 25 MG 24 hr tablet Take 1 tablet (25 mg total) by mouth daily. 04/11/20  Yes Dunn, Dayna N, PA-C  betamethasone valerate ointment (VALISONE) 0.1 % Apply 1 application topically 2 (two) times daily. Can use for up to 2 weeks, not for daily long term use. Patient not taking: Reported on 07/11/2020 07/17/19   Salvadore Dom, MD  losartan (COZAAR) 100 MG tablet Take 1 tablet (100 mg total) by mouth daily. Patient not taking: Reported on 07/11/2020 07/23/19   Marrian Salvage, FNP     Blood pressure 111/81, pulse 63, temperature (!) 97.4 F (36.3 C), temperature source Oral, resp. rate 15, height 5\' 1"  (1.549 m), weight 64.9 kg, last menstrual period 06/19/2020, SpO2 100 %. Physical Exam:  General: pleasant, WD, WN white female who is laying in bed in NAD HEENT: head is normocephalic, atraumatic.  Sclera are noninjected.  Pupils are equal.  Ears and nose without any masses or lesions.  Mouth is pink and moist Heart: regular, rate, and rhythm.  Normal s1,s2. No obvious murmurs, gallops, or rubs noted.  Palpable radial and pedal pulses bilaterally  Lungs: CTAB, no wheezes, rhonchi, or rales noted.  Respiratory effort nonlabored Abd: soft, she is mildly tender RUQ on palpation, ND, +BS, no masses, hernias, or organomegaly MS: all 4 extremities are symmetrical with no cyanosis, clubbing, or edema. Skin: warm and dry with no masses, lesions, or rashes Neuro: Cranial nerves 2-12 grossly intact, sensation is normal throughout Psych: A&Ox3 with an appropriate affect.   Results for orders placed or performed during the hospital encounter of 07/11/20 (from the past 48 hour(s))  Lipase, blood     Status: None   Collection Time: 07/11/20  6:12 AM  Result Value Ref Range   Lipase 48 11 - 51 U/L    Comment: Performed at [facility], [place] [address] [place], [place] [address]  Comprehensive metabolic panel     Status: Abnormal   Collection Time: 07/11/20  6:12 AM  Result Value Ref Range    Sodium 140 135 - 145 mmol/L   Potassium 3.6 3.5 - 5.1 mmol/L   Chloride 109 98 - 111 mmol/L   CO2 22 22 - 32 mmol/L   Glucose, Bld 129 (H) 70 - 99 mg/dL    Comment: Glucose reference range applies only to samples taken after fasting for at least 8 hours.   BUN 15 6 - 20 mg/dL   Creatinine, Ser 0.83 0.44 - 1.00 mg/dL   Calcium 8.7 (L) 8.9 - 10.3 mg/dL   Total Protein 5.4 (L) 6.5 - 8.1 g/dL   Albumin 3.5 3.5 - 5.0 g/dL   AST 33 15 - 41 U/L   ALT 25 0 - 44 U/L   Alkaline Phosphatase 59 38 - 126 U/L   Total Bilirubin 0.2 (L) 0.3 - 1.2 mg/dL   GFR calc non Af Amer >60 >60 mL/min   GFR calc Af Amer >60 >60 mL/min   Anion gap 9 5 - 15    Comment: Performed at [facility] [address] [place], [place] [address]  CBC     Status: Abnormal   Collection Time: 07/11/20  6:12 AM  Result Value Ref Range   WBC 9.2 4.0 - 10.5 K/uL   RBC 4.03 3.87 - 5.11 MIL/uL   Hemoglobin 11.5 (L) 12.0 - 15.0 g/dL   HCT 35.6 (L) 36 - 46 %   MCV 88.3 80.0 - 100.0 fL   MCH 28.5 26.0 - 34.0 pg   MCHC 32.3 30.0 - 36.0 g/dL   RDW 13.0 11.5 - 15.5 %   Platelets 231 150 - 400 K/uL   nRBC 0.0 0.0 - 0.2 %    Comment: Performed at [facility], [place] [address] [place], [place] [address]  Troponin I (High Sensitivity)     Status: None   Collection Time: 07/11/20  6:12 AM  Result Value Ref Range   Troponin I (High Sensitivity) 5 <18 ng/L    Comment: (NOTE) Elevated high sensitivity troponin I (hsTnI) values and significant  changes across serial measurements may suggest ACS but many other  chronic and acute conditions are known to elevate hsTnI results.  Refer to the "Links" section for chest pain algorithms and additional  guidance. Performed at [facility], [place] [address] [place], [place] [address]   SARS Coronavirus 2 by RT PCR (hospital order, performed in [facility] hospital lab) Nasopharyngeal Nasopharyngeal Swab     Status: None   Collection Time: 07/11/20 10:36 AM   Specimen:  Nasopharyngeal Swab  Result Value Ref Range  SARS Coronavirus 2 NEGATIVE NEGATIVE    Comment: (NOTE) SARS-CoV-2 target nucleic acids are NOT DETECTED.  The SARS-CoV-2 RNA is generally detectable in upper and lower respiratory specimens during the acute phase of infection. The lowest concentration of SARS-CoV-2 viral copies this assay can detect is 250 copies / mL. A negative result does not preclude SARS-CoV-2 infection and should not be used as the sole basis for treatment or other patient management decisions.  A negative result may occur with improper specimen collection / handling, submission of specimen other than nasopharyngeal swab, presence of viral mutation(s) within the areas targeted by this assay, and inadequate number of viral copies (<250 copies / mL). A negative result must be combined with clinical observations, patient history, and epidemiological information.  Fact Sheet for Patients:   StrictlyIdeas.no  Fact Sheet for Healthcare Providers: BankingDealers.co.za  This test is not yet approved or  cleared by the [place] FDA and has been authorized for detection and/or diagnosis of SARS-CoV-2 by FDA under an Emergency Use Authorization (EUA).  This EUA will remain in effect (meaning this test can be used) for the duration of the COVID-19 declaration under Section 564(b)(1) of the Act, 21 U.S.C. section 360bbb-3(b)(1), unless the authorization is terminated or revoked sooner.  Performed at [facility], [place] [address] [place], [place] [address]   Troponin I (High Sensitivity)     Status: None   Collection Time: 07/11/20 10:55 AM  Result Value Ref Range   Troponin I (High Sensitivity) 8 <18 ng/L    Comment: (NOTE) Elevated high sensitivity troponin I (hsTnI) values and significant  changes across serial measurements may suggest ACS but many other  chronic and acute conditions are known to elevate hsTnI  results.  Refer to the "Links" section for chest pain algorithms and additional  guidance. Performed at [facility], [place] [address] [place], [place] [address]    DG Chest 2 View  Result Date: 07/11/2020 CLINICAL DATA:  Left chest pain and left upper quadrant abdominal pain. EXAM: CHEST - 2 VIEW COMPARISON:  Coronary calcium score CT 11/21/2019 FINDINGS: The cardiac silhouette is upper limits of normal in size. The lungs are hypoinflated without evidence of airspace consolidation, edema, pleural effusion, pneumothorax. No acute osseous abnormality is seen. IMPRESSION: No active cardiopulmonary disease. Electronically Signed   By: Logan Bores M.D.   On: 07/11/2020 07:39   CT ABDOMEN PELVIS W CONTRAST  Result Date: 07/11/2020 CLINICAL DATA:  Severe LEFT upper quadrant pain beginning this morning at 0200 hours, past history of appendectomy and tubal ligation EXAM: CT ABDOMEN AND PELVIS WITH CONTRAST TECHNIQUE: Multidetector CT imaging of the abdomen and pelvis was performed using the standard protocol following bolus administration of intravenous contrast. Sagittal and coronal MPR images reconstructed from axial data set. CONTRAST:  162mL OMNIPAQUE IOHEXOL 300 MG/ML SOLN IV. No oral contrast. COMPARISON:  None FINDINGS: Lower chest: Dependent atelectasis at lung bases. Hepatobiliary: 2 hepatic cysts, larger 2.1 x 1.5 cm image 24. Multiple gallstones in gallbladder including a gallstone at the neck. Mild gallbladder wall thickening. Acute cholecystitis not excluded. No biliary dilatation. Pancreas: Normal appearance Spleen: Normal appearance Adrenals/Urinary Tract: Adrenal glands, kidneys, ureters, and bladder normal appearance. Stomach/Bowel: Appendix surgically absent by history. Stomach and bowel loops normal appearance Vascular/Lymphatic: Vascular structures patent. Minimal atherosclerotic calcification aorta without aneurysm. No adenopathy. Reproductive: Small enhancing nodule within anterior  uterus likely representing an 8 mm submucosal leiomyoma. Uterus and ovaries otherwise unremarkable. Other: No free air  or free fluid.  No hernia. Musculoskeletal: RIGHT spondylolysis L5 without spondylolisthesis. No other osseous findings. IMPRESSION: Cholelithiasis with gallbladder wall thickening and a stone at the gallbladder neck, cannot exclude acute cholecystitis; recommend ultrasound abdomen follow-up. Small hepatic cysts. Questionable 8 mm submucosal leiomyoma anterior uterus. RIGHT spondylolysis L5 without spondylolisthesis. Electronically Signed   By: Lavonia Dana M.D.   On: 07/11/2020 09:57   US Abdomen Limited RUQ  Result Date: 07/11/2020 CLINICAL DATA:  54 year old female with acute RIGHT UPPER quadrant abdominal pain. EXAM: ULTRASOUND ABDOMEN LIMITED RIGHT UPPER QUADRANT COMPARISON:  None. FINDINGS: Gallbladder: Multiple gallstones are noted, the largest measuring 1.2 cm. Gallbladder wall thickening and gallbladder distension noted. No pericholecystic fluid is identified. Common bile duct: Diameter: 4 mm. No intrahepatic or extrahepatic biliary dilatation identified. Liver: Hepatic cysts are identified. No suspicious focal hepatic abnormality noted. Within normal limits in parenchymal echogenicity. Portal vein is patent on color Doppler imaging with normal direction of blood flow towards the liver. Other: None. IMPRESSION: Cholelithiasis with gallbladder wall thickening compatible with acute cholecystitis. No biliary dilatation. No significant hepatic abnormalities. Electronically Signed   By: Margarette Canada M.D.   On: 07/11/2020 11:50      Assessment/Plan Acute cholecystitis with cholelithiasis Hypertension Hx PVC'S Hx anxiety/depression  Plan:  Dr. Georgette Dover has seen and examined the patient.  She will be admitted and taken to the OR for laparoscopic cholecystectomy.  Risk and benefits were discussed in detail.    Earnstine Regal [facility] 07/11/2020, 12:42 PM Please  see Amion for pager number during day hours 7:00am-4:30pm

## 2020-07-11 NOTE — Anesthesia Postprocedure Evaluation (Signed)
Anesthesia Post Note  Patient: Stephanie Gray  Procedure(s) Performed: LAPAROSCOPIC CHOLECYSTECTOMY (N/A Abdomen)     Patient location during evaluation: PACU Anesthesia Type: General Level of consciousness: awake and alert Pain management: pain level controlled Vital Signs Assessment: post-procedure vital signs reviewed and stable Respiratory status: spontaneous breathing, nonlabored ventilation, respiratory function stable and patient connected to nasal cannula oxygen Cardiovascular status: blood pressure returned to baseline and stable Postop Assessment: no apparent nausea or vomiting Anesthetic complications: no   No complications documented.  Last Vitals:  Vitals:   07/11/20 1606 07/11/20 1620  BP: (!) 124/86 (!) 130/81  Pulse: 100 90  Resp: 13 19  Temp: 36.7 C   SpO2: 96% 99%    Last Pain:  Vitals:   07/11/20 1620  TempSrc:   PainSc: 0-No pain                 , DAVID

## 2020-07-11 NOTE — ED Provider Notes (Signed)
[facility] EMERGENCY DEPARTMENT Provider Note   CSN: [id number] Arrival date & time: 07/11/20  3559     History Chief Complaint  Patient presents with   Abdominal Pain    Stephanie Gray is a 54 y.o. female.  Patient with history of cesarean section, appendectomy presents the emergency department with acute onset of left upper quadrant pain starting approximately 1-1/2 hours ago.  Patient states that she was awakened from sleep with sharp pain that radiated to her back in this area.  Pain was severe.  She states that she could not walk or think.  No associated nausea, vomiting, diarrhea.  She has not had pain like this in the past.  She tried Maalox which did not help.  EMS was called and administered fentanyl, NTG, zofran with improvement in the pain.  Patient denies heavy NSAID use, history of gallstones, alcohol use.  The onset of this condition was acute. The course is constant. Aggravating factors: none.         Past Medical History:  Diagnosis Date   Anxiety    Apocrine metaplasia of breast, right 03/03/2018   Arthritis    Atrial tachycardia, paroxysmal ([place])    noted on event monitor 02/2020   Breast mass, right 04/12/2017   Cervical disc disease    Complication of anesthesia    very sensitive to all mdications,needs lower dosage   Depression    Hypertension    Hypothyroidism 03/17/2018   Neuromuscular disorder (HCC)    numbness in 2 fingers of left hand   Perimenopausal disorder 03/17/2018   Premature atrial contractions    PVC's (premature ventricular contractions)    noted on heart monitor 02/2020   Routine general medical examination at a health care facility 04/06/2016   Tubular adenoma of colon 04/27/2015   04/07/2015 @ 70 cm ,polyp; Dr Collene Mares Due 2019     Patient Active Problem List   Diagnosis Date Noted   PVC's (premature ventricular contractions)    Atrial tachycardia, paroxysmal ([place])    Family history of premature  CAD 11/21/2019   Family history of bicuspid aortic valve 11/21/2019   Hypothyroidism 03/17/2018   Menorrhagia 03/17/2018   Perimenopausal disorder 03/17/2018   Lightheadedness 03/17/2018   Neuromuscular disorder ([place])    Depression    Arthritis    Anxiety    Apocrine metaplasia of breast, right 03/03/2018   Breast mass, right 04/12/2017   Hypertension 06/28/2016   Routine general medical examination at a health care facility 04/06/2016   Elevated blood pressure reading without diagnosis of hypertension 06/12/2015   Tubular adenoma of colon 04/27/2015   Nonspecific abnormal results of thyroid function study 04/14/2009   DEPRESSION 04/14/2009   NONSPECIFIC ABNORMAL ELECTROCARDIOGRAM 04/14/2009    Past Surgical History:  Procedure Laterality Date   APPENDECTOMY     BLADDER REPAIR     BREAST LUMPECTOMY WITH RADIOACTIVE SEED LOCALIZATION Right 02/10/2017   Procedure: RIGHT BREAST LUMPECTOMY WITH RADIOACTIVE SEED LOCALIZATION;  Surgeon: Autumn Messing III, MD;  Location: [facility] OR;  Service: General;  Laterality: Right;   CESAREAN SECTION     X 2, Dr Reino Kent   EYE SURGERY Bilateral    [place]     bladder laceration repair     OB History    Gravida  2   Para  2   Term  2   Preterm      AB      Living  2  SAB      TAB      Ectopic      Multiple      Live Births  2           Family History  Problem Relation Age of Onset   Hypertension Mother    Other Mother        congenital heart defect - bicuspid aortic valve   Diabetes Father    Heart disease Father    Hypertension Father    Stroke Maternal Grandfather 31       dementia   Cancer Paternal Grandfather        testicular   Mitral valve prolapse Maternal Grandmother    Heart disease Paternal Grandmother    Breast cancer Paternal Grandmother    Other Sister        neurologic issue    Social History   Tobacco Use   Smoking status: Never Smoker    Smokeless tobacco: Never Used  Vaping Use   Vaping Use: Never used  Substance Use Topics   Alcohol use: Yes    Comment: Rarely   Drug use: No    Home Medications Prior to Admission medications   Medication Sig Start Date End Date Taking? Authorizing Provider  DULoxetine (CYMBALTA) 60 MG capsule Take 1 capsule (60 mg total) by mouth daily. 04/07/20  Yes Arfeen, Arlyce Harman, MD  metoprolol succinate (TOPROL XL) 25 MG 24 hr tablet Take 1 tablet (25 mg total) by mouth daily. 04/11/20  Yes Dunn, Dayna N, PA-C  betamethasone valerate ointment (VALISONE) 0.1 % Apply 1 application topically 2 (two) times daily. Can use for up to 2 weeks, not for daily long term use. Patient not taking: Reported on 07/11/2020 07/17/19   Salvadore Dom, MD  losartan (COZAAR) 100 MG tablet Take 1 tablet (100 mg total) by mouth daily. Patient not taking: Reported on 07/11/2020 07/23/19   Marrian Salvage, FNP    Allergies    Benzodiazepines, Other, Augmentin [amoxicillin-pot clavulanate], Lamictal [lamotrigine], and Penicillins  Review of Systems   Review of Systems  Constitutional: Negative for fever.  HENT: Negative for rhinorrhea and sore throat.   Eyes: Negative for redness.  Respiratory: Negative for cough.   Cardiovascular: Negative for chest pain.  Gastrointestinal: Positive for abdominal pain. Negative for diarrhea, nausea and vomiting.  Genitourinary: Negative for dysuria.  Musculoskeletal: Positive for back pain. Negative for myalgias.  Skin: Negative for rash.  Neurological: Negative for headaches.    Physical Exam Updated Vital Signs BP 93/65    Pulse 50    Temp (!) 97.4 F (36.3 C) (Oral)    Resp 19    Ht 5\' 1"  (1.549 m)    Wt 64.9 kg    LMP 10/10/2013    SpO2 97%    BMI 27.02 kg/m   Physical Exam Vitals and nursing note reviewed.  Constitutional:      Appearance: She is well-developed.  HENT:     Head: Normocephalic and atraumatic.  Eyes:     General:        Right eye: No  discharge.        Left eye: No discharge.     Conjunctiva/sclera: Conjunctivae normal.  Cardiovascular:     Rate and Rhythm: Normal rate and regular rhythm.     Heart sounds: Normal heart sounds.  Pulmonary:     Effort: Pulmonary effort is normal.     Breath sounds: Normal breath sounds.  Abdominal:  Palpations: Abdomen is soft.     Tenderness: There is abdominal tenderness (mild) in the left upper quadrant. There is no guarding or rebound.  Musculoskeletal:     Cervical back: Normal range of motion and neck supple.  Skin:    General: Skin is warm and dry.  Neurological:     Mental Status: She is alert.     ED Results / Procedures / Treatments   Labs (all labs ordered are listed, but only abnormal results are displayed) Labs Reviewed  COMPREHENSIVE METABOLIC PANEL - Abnormal; Notable for the following components:      Result Value   Glucose, Bld 129 (*)    Calcium 8.7 (*)    Total Protein 5.4 (*)    Total Bilirubin 0.2 (*)    All other components within normal limits  CBC - Abnormal; Notable for the following components:   Hemoglobin 11.5 (*)    HCT 35.6 (*)    All other components within normal limits  SARS CORONAVIRUS 2 BY RT PCR (HOSPITAL ORDER, [facility])  LIPASE, BLOOD  URINALYSIS, ROUTINE W REFLEX MICROSCOPIC  TROPONIN I (HIGH SENSITIVITY)  TROPONIN I (HIGH SENSITIVITY)    EKG EKG Interpretation  Date/Time:  Friday July 11 2020 06:10:54 EDT Ventricular Rate:  48 PR Interval:    QRS Duration: 106 QT Interval:  490 QTC Calculation: 438 R Axis:   79 Text Interpretation: Sinus bradycardia Borderline T abnormalities, anterior leads No significant change since last tracing Confirmed by Orpah Greek [id number]) on 07/11/2020 6:13:42 AM   Radiology DG Chest 2 View  Result Date: 07/11/2020 CLINICAL DATA:  Left chest pain and left upper quadrant abdominal pain. EXAM: CHEST - 2 VIEW COMPARISON:  Coronary calcium score CT  11/21/2019 FINDINGS: The cardiac silhouette is upper limits of normal in size. The lungs are hypoinflated without evidence of airspace consolidation, edema, pleural effusion, pneumothorax. No acute osseous abnormality is seen. IMPRESSION: No active cardiopulmonary disease. Electronically Signed   By: Logan Bores M.D.   On: 07/11/2020 07:39   CT ABDOMEN PELVIS W CONTRAST  Result Date: 07/11/2020 CLINICAL DATA:  Severe LEFT upper quadrant pain beginning this morning at 0200 hours, past history of appendectomy and tubal ligation EXAM: CT ABDOMEN AND PELVIS WITH CONTRAST TECHNIQUE: Multidetector CT imaging of the abdomen and pelvis was performed using the standard protocol following bolus administration of intravenous contrast. Sagittal and coronal MPR images reconstructed from axial data set. CONTRAST:  168mL OMNIPAQUE IOHEXOL 300 MG/ML SOLN IV. No oral contrast. COMPARISON:  None FINDINGS: Lower chest: Dependent atelectasis at lung bases. Hepatobiliary: 2 hepatic cysts, larger 2.1 x 1.5 cm image 24. Multiple gallstones in gallbladder including a gallstone at the neck. Mild gallbladder wall thickening. Acute cholecystitis not excluded. No biliary dilatation. Pancreas: Normal appearance Spleen: Normal appearance Adrenals/Urinary Tract: Adrenal glands, kidneys, ureters, and bladder normal appearance. Stomach/Bowel: Appendix surgically absent by history. Stomach and bowel loops normal appearance Vascular/Lymphatic: Vascular structures patent. Minimal atherosclerotic calcification aorta without aneurysm. No adenopathy. Reproductive: Small enhancing nodule within anterior uterus likely representing an 8 mm submucosal leiomyoma. Uterus and ovaries otherwise unremarkable. Other: No free air or free fluid.  No hernia. Musculoskeletal: RIGHT spondylolysis L5 without spondylolisthesis. No other osseous findings. IMPRESSION: Cholelithiasis with gallbladder wall thickening and a stone at the gallbladder neck, cannot exclude  acute cholecystitis; recommend ultrasound abdomen follow-up. Small hepatic cysts. Questionable 8 mm submucosal leiomyoma anterior uterus. RIGHT spondylolysis L5 without spondylolisthesis. Electronically Signed   By: Elta [place]  Thornton Papas M.D.   On: 07/11/2020 09:57    Procedures Procedures (including critical care time)  Medications Ordered in ED Medications  sodium chloride flush (NS) 0.9 % injection 3 mL (3 mLs Intravenous Not Given 07/11/20 0633)  sodium chloride 0.9 % bolus 1,000 mL (0 mLs Intravenous Stopped 07/11/20 0807)  HYDROmorphone (DILAUDID) injection 0.5 mg (0.5 mg Intravenous Given 07/11/20 0811)  iohexol (OMNIPAQUE) 300 MG/ML solution 100 mL (100 mLs Intravenous Contrast Given 07/11/20 0934)    ED Course  I have reviewed the triage vital signs and the nursing notes.  Pertinent labs & imaging results that were available during my care of the patient were reviewed by me and considered in my medical decision making (see chart for details).  Patient seen and examined. Work-up initiated. Currently appears comfortable. EKG reviewed.   Vital signs reviewed and are as follows: BP 93/65    Pulse 50    Temp (!) 97.4 F (36.3 C) (Oral)    Resp 19    Ht 5\' 1"  (1.549 m)    Wt 64.9 kg    LMP 10/10/2013    SpO2 97%    BMI 27.02 kg/m   Blood pressure soft on arrival. 1L NS ordered.   7:30 AM Reviewed CXR. Cardiac CT noted in Epic, 11/2019, calcium score of 0.    8:01 AM patient and family updated.  Patient seems very anxious.  Informed of negative lab testing and chest x-ray to this point.  Discussed utility of CT scan.  Patient would like to proceed.  She is complaining of bilateral numbness in her fingers and toes.  No unilateral symptoms or weakness.  10:26 AM patient updated on results.  I personally reviewed CT imaging.  Will obtain right upper quadrant ultrasound to better evaluate the gallbladder and to look for signs of thickening, pericholecystic fluid, and evaluate gallbladder neck stone.   On recheck, patient's pain is resolved after dose of IV Dilaudid.  She appears more comfortable.  1:48 PM [place] results discussed with patient. There is gallbladder wall thickening. Pain currently 2/10. Plan for surgical consult.   I spoke with Will Creig Hines PA-C and patient will be admitted for surgery by Dr. Georgette Dover later today.     MDM Rules/Calculators/A&P                          Admit.    Final Clinical Impression(s) / ED Diagnoses Final diagnoses:  RUQ pain  Acute cholecystitis    Rx / DC Orders ED Discharge Orders    None       Carlisle Cater, PA-C 07/11/20 [place], Gwenyth Allegra, MD 07/12/20 [phone]

## 2020-07-11 NOTE — ED Notes (Signed)
Patient transported to CT 

## 2020-07-11 NOTE — Anesthesia Procedure Notes (Signed)
Procedure Name: Intubation Date/Time: 07/11/2020 2:43 PM Performed by: Kathryne Hitch, CRNA Pre-anesthesia Checklist: Patient identified, Emergency Drugs available, Suction available and Patient being monitored Patient Re-evaluated:Patient Re-evaluated prior to induction Oxygen Delivery Method: Circle system utilized Preoxygenation: Pre-oxygenation with 100% oxygen Induction Type: IV induction Ventilation: Mask ventilation without difficulty Laryngoscope Size: Mac and 3 Grade View: Grade I Tube type: Oral Tube size: 7.0 mm Number of attempts: 1 Airway Equipment and Method: Stylet and Oral airway Placement Confirmation: ETT inserted through vocal cords under direct vision,  positive ETCO2 and breath sounds checked- equal and bilateral Secured at: 21 cm Tube secured with: Tape Dental Injury: Teeth and Oropharynx as per pre-operative assessment

## 2020-07-12 ENCOUNTER — Encounter (HOSPITAL_COMMUNITY): Payer: Self-pay | Admitting: Surgery

## 2020-07-14 LAB — SURGICAL PATHOLOGY

## 2020-08-05 NOTE — Progress Notes (Deleted)
Cardiology Office Note    Date:  08/05/2020   ID:  Stephanie Gray, DOB 1966/07/21, MRN [id number]  PCP:  Marrian Salvage, FNP  Cardiologist: Fransico Him, MD EPS: None  No chief complaint on file.   History of Present Illness:  Stephanie Gray is a 54 y.o. female with history of hypertension, anxiety depression, sensitivity to medications, palpitations that worsened after having Covid, family history of early CAD Father had an MI in his 46s, mother with bypass cuspid aortic valve.  2D echo 11/2019 normal LVEF 60 to 65%, mild asymmetric LVH mild AI.  Monitor showed occasional PACs and nonsustained atrial tachycardia up to 4 beats occasional PVCs.  Recommended Toprol-XL 25 mg daily.Calcium score was zero for coronary calcium but did show aortic atherosclerosis   Patient last had a telemedicine visit with Sharrell Ku, PA-C 04/11/2020 at which time she was doing well.  She was going to check her blood pressures and heart rates and send them through my chart.  Patient underwent lap cholecystectomy 07/11/2020  Past Medical History:  Diagnosis Date   Anxiety    Apocrine metaplasia of breast, right 03/03/2018   Arthritis    Atrial tachycardia, paroxysmal ([place])    noted on event monitor 02/2020   Breast mass, right 04/12/2017   Cervical disc disease    Complication of anesthesia    very sensitive to all mdications,needs lower dosage   Depression    Hypertension    Hypothyroidism 03/17/2018   Neuromuscular disorder (HCC)    numbness in 2 fingers of left hand   Perimenopausal disorder 03/17/2018   Premature atrial contractions    PVC's (premature ventricular contractions)    noted on heart monitor 02/2020   Routine general medical examination at a health care facility 04/06/2016   Tubular adenoma of colon 04/27/2015   04/07/2015 @ 70 cm ,polyp; Dr Collene Mares Due 2019     Past Surgical History:  Procedure Laterality Date   APPENDECTOMY     BLADDER REPAIR      BREAST LUMPECTOMY WITH RADIOACTIVE SEED LOCALIZATION Right 02/10/2017   Procedure: RIGHT BREAST LUMPECTOMY WITH RADIOACTIVE SEED LOCALIZATION;  Surgeon: Autumn Messing III, MD;  Location: [place];  Service: General;  Laterality: Right;   CESAREAN SECTION     X 2, Dr Reino Kent   CHOLECYSTECTOMY N/A 07/11/2020   Procedure: LAPAROSCOPIC CHOLECYSTECTOMY;  Surgeon: Donnie Mesa, MD;  Location: [place];  Service: General;  Laterality: N/A;   EYE SURGERY Bilateral    Lasik    TUBAL LIGATION     bladder laceration repair    Current Medications: No outpatient medications have been marked as taking for the 08/06/20 encounter (Appointment) with Imogene Burn, PA-C.     Allergies:   Benzodiazepines, Other, Augmentin [amoxicillin-pot clavulanate], Lamictal [lamotrigine], and Penicillins   Social History   Socioeconomic History   Marital status: Married    Spouse name: Not on file   Number of children: 2   Years of education: 18   Highest education level: Not on file  Occupational History   Occupation: [employer]  Tobacco Use   Smoking status: Never Smoker   Smokeless tobacco: Never Used  Scientific laboratory technician Use: Never used  Substance and Sexual Activity   Alcohol use: Yes    Comment: Rarely   Drug use: No   Sexual activity: Yes    Birth control/protection: Surgical    Comment: TBL  Other Topics Concern   Not on file  Social History Narrative   Fun: Ride horses.   Denies abuse and feels safe at home.    Social Determinants of Health   Financial Resource Strain:    Difficulty of Paying Living Expenses:   Food Insecurity:    Worried About Charity fundraiser in the Last Year:    Arboriculturist in the Last Year:   Transportation Needs:    Film/video editor (Medical):    Lack of Transportation (Non-Medical):   Physical Activity:    Days of Exercise per Week:    Minutes of Exercise per Session:   Stress:    Feeling of Stress :   Social Connections:     Frequency of Communication with Friends and Family:    Frequency of Social Gatherings with Friends and Family:    Attends Religious Services:    Active Member of Clubs or Organizations:    Attends Music therapist:    Marital Status:      Family History:  The patient's ***family history includes Breast cancer in her paternal grandmother; Cancer in her paternal grandfather; Diabetes in her father; Heart disease in her father and paternal grandmother; Hypertension in her father and mother; Mitral valve prolapse in her maternal grandmother; Other in her mother and sister; Stroke (age of onset: 11) in her maternal grandfather.   ROS:   Please see the history of present illness.    ROS All other systems reviewed and are negative.   PHYSICAL EXAM:   VS:  LMP 06/19/2020 (Approximate)   Physical Exam  GEN: Well nourished, well developed, in no acute distress  HEENT: normal  Neck: no JVD, carotid bruits, or masses Cardiac:RRR; no murmurs, rubs, or gallops  Respiratory:  clear to auscultation bilaterally, normal work of breathing GI: soft, nontender, nondistended, + BS Ext: without cyanosis, clubbing, or edema, Good distal pulses bilaterally MS: no deformity or atrophy  Skin: warm and dry, no rash Neuro:  Alert and Oriented x 3, Strength and sensation are intact Psych: euthymic mood, full affect  Wt Readings from Last 3 Encounters:  07/11/20 143 lb (64.9 kg)  04/11/20 142 lb (64.4 kg)  11/21/19 142 lb (64.4 kg)      Studies/Labs Reviewed:   EKG:  EKG is*** ordered today.  The ekg ordered today demonstrates ***  Recent Labs: 10/23/2019: TSH 3.02 07/11/2020: ALT 25; BUN 15; Creatinine, Ser 0.83; Hemoglobin 11.5; Platelets 231; Potassium 3.6; Sodium 140   Lipid Panel    Component Value Date/Time   CHOL 165 07/23/2019 1215   TRIG 65.0 07/23/2019 1215   HDL 63.60 07/23/2019 1215   CHOLHDL 3 07/23/2019 1215   VLDL 13.0 07/23/2019 1215   LDLCALC 88 07/23/2019 1215      Additional studies/ records that were reviewed today include:     2D echo 11/2019  1. Left ventricular ejection fraction, by visual estimation, is 60 to  65%. The left ventricle has normal function. There is mildly increased  asymmetric left ventricular hypertrophy.   2. The left ventricle has no regional wall motion abnormalities.   3. Left ventricular diastolic parameters are indeterminate.   4. Global right ventricle has normal systolic function.The right  ventricular size is normal.   5. Left atrial size was normal.   6. Right atrial size was normal.   7. The mitral valve is normal in structure. No evidence of mitral valve  regurgitation.   8. The tricuspid valve is normal in structure. Tricuspid valve  regurgitation  is not demonstrated.   9. The aortic valve is tricuspid. Aortic valve regurgitation is mild. No  evidence of aortic valve sclerosis or stenosis.  10. The pulmonic valve was not well visualized. Pulmonic valve  regurgitation is trivial.  11. TR signal is inadequate for assessing pulmonary artery systolic  pressure.  12. The inferior vena cava is normal in size with greater than 50%  respiratory variability, suggesting right atrial pressure of 3 mmHg.   Coronary CTA 11/2019 IMPRESSION: Coronary calcium score of 0. This was 0 percentile for age and sex matched control. Traci Turner  IMPRESSION: 1.  No acute findings in the imaged extracardiac chest. 2.  Aortic Atherosclerosis (ICD10-I70.0).   Event Monitor Resulted 02/2020  Sinus bradycardia, normal sinus rhythm and sinus tachycardia. The average heart rate was 84bpm and ranged from 54 to 152bpm.  Occasional PACs and nonsustained atrial tachycardia up to 4 beats.  Occasional PVCs.       ASSESSMENT:    1. Essential hypertension   2. Palpitations   3. PVC's (premature ventricular contractions)   4. PAC (premature atrial contraction)   5. Atherosclerosis of aorta (HCC)      PLAN:  In order of  problems listed above:  Essential hypertension with asymmetric LVH on echo  History of palpitations with PVCs and PACs  Aortic atherosclerosis on coronary CT but calcium score 0 risk factor modifications recommended   Medication Adjustments/Labs and Tests Ordered: Current medicines are reviewed at length with the patient today.  Concerns regarding medicines are outlined above.  Medication changes, Labs and Tests ordered today are listed in the Patient Instructions below. There are no Patient Instructions on file for this visit.   Sumner Boast, PA-C  08/05/2020 2:31 PM    [facility] [place], [place], [place]  [address] Phone: [phone]; Fax: [phone]

## 2020-08-06 ENCOUNTER — Ambulatory Visit: Payer: Self-pay | Admitting: Physician Assistant

## 2020-08-12 ENCOUNTER — Ambulatory Visit: Payer: Self-pay | Admitting: Physician Assistant

## 2020-08-19 NOTE — Progress Notes (Signed)
Cardiology Office Note    Date:  08/20/2020   ID:  ARDEAN Gray, DOB [birth year], MRN [id number]  PCP:  Marrian Salvage, FNP  Cardiologist: Fransico Him, MD EPS: None  No chief complaint on file.   History of Present Illness:  Stephanie Gray is a 54 y.o. female with history of hypertension, anxiety depression, sensitivity to medications, palpitations that worsened after having Covid, family history of early CAD Father had an MI in his 31s, mother with bypass cuspid aortic valve.  2D echo 11/2019 normal LVEF 60 to 65%, mild asymmetric LVH mild AI.  Monitor showed occasional PACs and nonsustained atrial tachycardia up to 4 beats occasional PVCs.  Recommended Toprol-XL 25 mg daily.Calcium score was zero for coronary calcium but did show aortic atherosclerosis   Patient last had a telemedicine visit with Melina Copa, PA-C 04/11/2020 at which time she was doing well.  She was going to check her blood pressures and heart rates and send them through my chart.  Patient underwent lap cholecystectomy 07/11/2020.   Patient says she was hypotensive with gallbladder problems and they stopped her losartan for about a week and then she started it back. Hasn't been checking BP or HR recently. Only occasional palpitations. 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     Past Medical History:  Diagnosis Date  . Anxiety   . Apocrine metaplasia of breast, right 03/03/2018  . Arthritis   . Atrial tachycardia, paroxysmal ([place])    noted on event monitor 02/2020  . Breast mass, right 04/12/2017  . Cervical disc disease   . Complication of anesthesia    very sensitive to all mdications,needs lower dosage  . Depression   . Hypertension   . Hypothyroidism 03/17/2018  . Neuromuscular disorder (HCC)    numbness in 2 fingers of left hand  . Perimenopausal disorder 03/17/2018  . Premature atrial contractions   . PVC's (premature  ventricular contractions)    noted on heart monitor 02/2020  . Routine general medical examination at a health care facility 04/06/2016  . Tubular adenoma of colon 04/27/2015   04/07/2015 @ 70 cm ,polyp; Dr Collene Mares Due 2019     Past Surgical History:  Procedure Laterality Date  . APPENDECTOMY    . BLADDER REPAIR    . BREAST LUMPECTOMY WITH RADIOACTIVE SEED LOCALIZATION Right 02/10/2017   Procedure: RIGHT BREAST LUMPECTOMY WITH RADIOACTIVE SEED LOCALIZATION;  Surgeon: Autumn Messing III, MD;  Location: [facility];  Service: General;  Laterality: Right;  . CESAREAN SECTION     X 2, Dr Reino Kent  . CHOLECYSTECTOMY N/A 07/11/2020   Procedure: LAPAROSCOPIC CHOLECYSTECTOMY;  Surgeon: Donnie Mesa, MD;  Location: [facility];  Service: General;  Laterality: N/A;  . EYE SURGERY Bilateral    Lasik   . TUBAL LIGATION     bladder laceration repair    Current Medications: Current Meds  Medication Sig  . acetaminophen (TYLENOL) 325 MG tablet Take 2 tablets (650 mg total) by mouth every 6 (six) hours as needed.  . docusate sodium (COLACE) 100 MG capsule Take 1 capsule (100 mg total) by mouth 2 (two) times daily as needed for mild constipation.  . DULoxetine (CYMBALTA) 60 MG capsule Take 1 capsule (60 mg total) by mouth daily.  Marland Kitchen losartan (COZAAR) 100 MG tablet Take 100 mg by mouth daily.  . metoprolol succinate (TOPROL XL) 25 MG 24 hr tablet Take 1 tablet (25 mg total) by mouth daily.  Allergies:   Benzodiazepines, Other, Augmentin [amoxicillin-pot clavulanate], Lamictal [lamotrigine], and Penicillins   Social History   Socioeconomic History  . Marital status: Married    Spouse name: Not on file  . Number of children: 2  . Years of education: 59  . Highest education level: Not on file  Occupational History  . Occupation: Horse [employer]  Tobacco Use  . Smoking status: Never Smoker  . Smokeless tobacco: Never Used  Vaping Use  . Vaping Use: Never used  Substance and Sexual Activity  . Alcohol use: Yes     Comment: Rarely  . Drug use: No  . Sexual activity: Yes    Birth control/protection: Surgical    Comment: TBL  Other Topics Concern  . Not on file  Social History Narrative   Fun: Ride horses.   Denies abuse and feels safe at home.    Social Determinants of Health   Financial Resource Strain:   . Difficulty of Paying Living Expenses: Not on file  Food Insecurity:   . Worried About Charity fundraiser in the Last Year: Not on file  . Ran Out of Food in the Last Year: Not on file  Transportation Needs:   . Lack of Transportation (Medical): Not on file  . Lack of Transportation (Non-Medical): Not on file  Physical Activity:   . Days of Exercise per Week: Not on file  . Minutes of Exercise per Session: Not on file  Stress:   . Feeling of Stress : Not on file  Social Connections:   . Frequency of Communication with Friends and Family: Not on file  . Frequency of Social Gatherings with Friends and Family: Not on file  . Attends Religious Services: Not on file  . Active Member of Clubs or Organizations: Not on file  . Attends Archivist Meetings: Not on file  . Marital Status: Not on file     Family History:  The patient's family history includes Breast cancer in her paternal grandmother; Cancer in her paternal grandfather; Diabetes in her father; Heart disease in her father and paternal grandmother; Hypertension in her father and mother; Mitral valve prolapse in her maternal grandmother; Other in her mother and sister; Stroke (age of onset: 80) in her maternal grandfather.   ROS:   Please see the history of present illness.    ROS All other systems reviewed and are negative.   PHYSICAL EXAM:   VS:  BP 140/82   Pulse 67   Ht 5\' 1"  (1.549 m)   Wt 149 lb (67.6 kg)   LMP 06/19/2020 (Approximate)   SpO2 98%   BMI 28.15 kg/m   Physical Exam  GEN: Well nourished, well developed, in no acute distress  Neck: no JVD, carotid bruits, or masses Cardiac:RRR; no murmurs,  rubs, or gallops  Respiratory:  clear to auscultation bilaterally, normal work of breathing GI: soft, nontender, nondistended, + BS Ext: without cyanosis, clubbing, or edema, Good distal pulses bilaterally Neuro:  Alert and Oriented x 3 Psych: euthymic mood, full affect  Wt Readings from Last 3 Encounters:  08/20/20 149 lb (67.6 kg)  07/11/20 143 lb (64.9 kg)  04/11/20 142 lb (64.4 kg)      Studies/Labs Reviewed:   EKG:  EKG is not ordered today.    Recent Labs: 10/23/2019: TSH 3.02 07/11/2020: ALT 25; BUN 15; Creatinine, Ser 0.83; Hemoglobin 11.5; Platelets 231; Potassium 3.6; Sodium 140   Lipid Panel    Component Value Date/Time  CHOL 165 07/23/2019 1215   TRIG 65.0 07/23/2019 1215   HDL 63.60 07/23/2019 1215   CHOLHDL 3 07/23/2019 1215   VLDL 13.0 07/23/2019 1215   LDLCALC 88 07/23/2019 1215    Additional studies/ records that were reviewed today include:    2D echo 11/2019  1. Left ventricular ejection fraction, by visual estimation, is 60 to  65%. The left ventricle has normal function. There is mildly increased  asymmetric left ventricular hypertrophy.   2. The left ventricle has no regional wall motion abnormalities.   3. Left ventricular diastolic parameters are indeterminate.   4. Global right ventricle has normal systolic function.The right  ventricular size is normal.   5. Left atrial size was normal.   6. Right atrial size was normal.   7. The mitral valve is normal in structure. No evidence of mitral valve  regurgitation.   8. The tricuspid valve is normal in structure. Tricuspid valve  regurgitation is not demonstrated.   9. The aortic valve is tricuspid. Aortic valve regurgitation is mild. No  evidence of aortic valve sclerosis or stenosis.  10. The pulmonic valve was not well visualized. Pulmonic valve  regurgitation is trivial.  11. TR signal is inadequate for assessing pulmonary artery systolic  pressure.  12. The inferior vena cava is normal in  size with greater than 50%  respiratory variability, suggesting right atrial pressure of 3 mmHg.   Coronary CTA 11/2019 IMPRESSION: Coronary calcium score of 0. This was 0 percentile for age and sex matched control. Traci Turner  IMPRESSION: 1.  No acute findings in the imaged extracardiac chest. 2.  Aortic Atherosclerosis (ICD10-I70.0).   Event Monitor Resulted 02/2020  Sinus bradycardia, normal sinus rhythm and sinus tachycardia. The average heart rate was 84bpm and ranged from 54 to 152bpm.  Occasional PACs and nonsustained atrial tachycardia up to 4 beats.  Occasional PVCs.        ASSESSMENT:    1. Essential hypertension   2. Palpitations   3. PVC's (premature ventricular contractions)   4. Premature atrial contractions   5. Atherosclerosis of aorta (HCC)      PLAN:  In order of problems listed above:   Essential hypertension with asymmetric LVH on echo controlled on losartan and metoprolol. Recheck bmet since restarting losartan.  History of palpitations with PVCs and PACs controlled with metoprolol   Aortic atherosclerosis on coronary CT but calcium score 0 risk factor modifications recommended. Family history of CAD. Check FLP     Medication Adjustments/Labs and Tests Ordered: Current medicines are reviewed at length with the patient today.  Concerns regarding medicines are outlined above.  Medication changes, Labs and Tests ordered today are listed in the Patient Instructions below. Patient Instructions  Medication Instructions:  Your physician recommends that you continue on your current medications as directed. Please refer to the Current Medication list given to you today.  *If you need a refill on your cardiac medications before your next appointment, please call your pharmacy*   Lab Work: None ordered  If you have labs (blood work) drawn today and your tests are completely normal, you will receive your results only by: Marland Kitchen MyChart Message (if you  have MyChart) OR . A paper copy in the mail If you have any lab test that is abnormal or we need to change your treatment, we will call you to review the results.   Testing/Procedures: None ordered   Follow-Up: At [facility], you and your health needs are our priority.  As part of our continuing mission to provide you with exceptional heart care, we have created designated Provider Care Teams.  These Care Teams include your primary Cardiologist (physician) and Advanced Practice Providers (APPs -  Physician Assistants and Nurse Practitioners) who all work together to provide you with the care you need, when you need it.  We recommend signing up for the patient portal called "MyChart".  Sign up information is provided on this After Visit Summary.  MyChart is used to connect with patients for Virtual Visits (Telemedicine).  Patients are able to view lab/test results, encounter notes, upcoming appointments, etc.  Non-urgent messages can be sent to your provider as well.   To learn more about what you can do with MyChart, go to [email or web address].    Your next appointment:   12 month(s)  The format for your next appointment:   In Person  Provider:   You may see Fransico Him, MD or one of the following Advanced Practice Providers on your designated Care Team:    Melina Copa, PA-C  Ermalinda Barrios, PA-C    Other Instructions      Signed, Ermalinda Barrios, PA-C  08/20/2020 11:46 AM    [address], [place], [place]  [address] Phone: [phone]; Fax: [phone]

## 2020-08-20 ENCOUNTER — Other Ambulatory Visit: Payer: Self-pay

## 2020-08-20 ENCOUNTER — Encounter: Payer: Self-pay | Admitting: Physician Assistant

## 2020-08-20 ENCOUNTER — Ambulatory Visit (INDEPENDENT_AMBULATORY_CARE_PROVIDER_SITE_OTHER): Payer: BC Managed Care – PPO | Admitting: Physician Assistant

## 2020-08-20 VITALS — BP 140/82 | HR 67 | Ht 61.0 in | Wt 149.0 lb

## 2020-08-20 DIAGNOSIS — I493 Ventricular premature depolarization: Secondary | ICD-10-CM | POA: Diagnosis not present

## 2020-08-20 DIAGNOSIS — I10 Essential (primary) hypertension: Secondary | ICD-10-CM

## 2020-08-20 DIAGNOSIS — I491 Atrial premature depolarization: Secondary | ICD-10-CM

## 2020-08-20 DIAGNOSIS — R002 Palpitations: Secondary | ICD-10-CM

## 2020-08-20 DIAGNOSIS — I700 Atherosclerosis of aorta: Secondary | ICD-10-CM

## 2020-08-20 NOTE — Patient Instructions (Addendum)
Medication Instructions:  Your physician recommends that you continue on your current medications as directed. Please refer to the Current Medication list given to you today.  *If you need a refill on your cardiac medications before your next appointment, please call your pharmacy*   Lab Work: At your convenience:  Come to the office for [place] / BMET.. NOTHING TO EAT OR DRINK AFTER MIDNIGHT THE NIGHT BEFORE  If you have labs (blood work) drawn today and your tests are completely normal, you will receive your results only by: Marland Kitchen MyChart Message (if you have MyChart) OR . A paper copy in the mail If you have any lab test that is abnormal or we need to change your treatment, we will call you to review the results.   Testing/Procedures: None ordered   Follow-Up: At [facility], you and your health needs are our priority.  As part of our continuing mission to provide you with exceptional heart care, we have created designated Provider Care Teams.  These Care Teams include your primary Cardiologist (physician) and Advanced Practice Providers (APPs -  Physician Assistants and Nurse Practitioners) who all work together to provide you with the care you need, when you need it.  We recommend signing up for the patient portal called "MyChart".  Sign up information is provided on this After Visit Summary.  MyChart is used to connect with patients for Virtual Visits (Telemedicine).  Patients are able to view lab/test results, encounter notes, upcoming appointments, etc.  Non-urgent messages can be sent to your provider as well.   To learn more about what you can do with MyChart, go to [email or web address].    Your next appointment:   12 month(s)  The format for your next appointment:   In Person  Provider:   You may see Fransico Him, MD or one of the following Advanced Practice Providers on your designated Care Team:    Ermalinda Barrios, PA-C    Other Instructions

## 2020-08-22 ENCOUNTER — Other Ambulatory Visit: Payer: BC Managed Care – PPO

## 2020-09-01 ENCOUNTER — Telehealth: Payer: Self-pay | Admitting: *Deleted

## 2020-09-01 NOTE — Telephone Encounter (Signed)
° °  [facility] Pre-operative Risk Assessment    HEARTCARE STAFF: - Please ensure there is not already an duplicate clearance open for this procedure. - Under Visit Info/Reason for Call, type in Other and utilize the format Clearance MM/DD/YY or Clearance TBD. Do not use dashes or single digits. - If request is for dental extraction, please clarify the # of teeth to be extracted.  Request for surgical clearance:  1. What type of surgery is being performed?  COLONSCOPY   2. When is this surgery scheduled?  09/08/20   3. What type of clearance is required (medical clearance vs. Pharmacy clearance to hold med vs. Both)?  MEDICAL  4. Are there any medications that need to be held prior to surgery and how long?  N/A  5. Practice name and name of physician performing surgery?  [facility]. What is the office phone number?  [phone]   7.   What is the office fax number?  [phone]  8.   Anesthesia type (None, local, MAC, general) ?  PROPOFOL    Jeanann Lewandowsky 09/01/2020, 3:45 PM  _________________________________________________________________   (provider comments below)

## 2020-09-02 NOTE — Telephone Encounter (Signed)
Attempted to reach the patient, however her mailbox is full.  Unable to leave message.

## 2020-09-03 NOTE — Telephone Encounter (Signed)
   Primary Cardiologist: Fransico Him, MD  Chart reviewed as part of pre-operative protocol coverage. Patient was contacted 09/03/2020 in reference to pre-operative risk assessment for pending surgery as outlined below.  Starlynn I Cregger was last seen on 08/20/20 by Dr. Radford Pax.  Since that day, Bedie I Struckman has done well from a cardiac standpoint. She can easily complete 4 METs without anginal complaints.  Therefore, based on ACC/AHA guidelines, the patient would be at acceptable risk for the planned procedure without further cardiovascular testing.   The patient was advised that if she develops new symptoms prior to surgery to contact our office to arrange for a follow-up visit, and she verbalized understanding.  I will route this recommendation to the requesting party via Epic fax function and remove from pre-op pool. Please call with questions.  Abigail Butts, PA-C 09/03/2020, 9:39 AM

## 2020-09-08 DIAGNOSIS — Z1211 Encounter for screening for malignant neoplasm of colon: Secondary | ICD-10-CM | POA: Diagnosis not present

## 2020-09-08 LAB — HM COLONOSCOPY

## 2020-09-28 ENCOUNTER — Other Ambulatory Visit (HOSPITAL_COMMUNITY): Payer: Self-pay | Admitting: Psychiatry

## 2020-09-28 DIAGNOSIS — F419 Anxiety disorder, unspecified: Secondary | ICD-10-CM

## 2020-09-28 DIAGNOSIS — F330 Major depressive disorder, recurrent, mild: Secondary | ICD-10-CM

## 2020-10-07 ENCOUNTER — Ambulatory Visit (INDEPENDENT_AMBULATORY_CARE_PROVIDER_SITE_OTHER): Payer: BC Managed Care – PPO | Admitting: Psychiatry

## 2020-10-07 ENCOUNTER — Encounter (HOSPITAL_COMMUNITY): Payer: Self-pay | Admitting: Psychiatry

## 2020-10-07 ENCOUNTER — Other Ambulatory Visit: Payer: Self-pay

## 2020-10-07 DIAGNOSIS — F330 Major depressive disorder, recurrent, mild: Secondary | ICD-10-CM

## 2020-10-07 DIAGNOSIS — F419 Anxiety disorder, unspecified: Secondary | ICD-10-CM

## 2020-10-07 MED ORDER — DULOXETINE HCL 60 MG PO CPEP: 60.0000 mg | ORAL_CAPSULE | Freq: Every day | ORAL | 1 refills | Status: DC

## 2020-10-07 NOTE — Progress Notes (Signed)
Virtual Visit via Telephone Note  I connected with Stephanie Gray on 10/07/20 at  2:00 PM EDT by telephone and verified that I am speaking with the correct person using two identifiers.  Location: Patient: home Provider: home office   I discussed the limitations, risks, security and privacy concerns of performing an evaluation and management service by telephone and the availability of in person appointments. I also discussed with the patient that there may be a patient responsible charge related to this service. The patient expressed understanding and agreed to proceed.   History of Present Illness: Patient is evaluated by phone session. She is compliant with Cymbalta 60 mg daily. She had acute episode of cholecystitis few months ago but she is feeling better. She is back to her normal level of activities and she has no more abdominal pain. She feels her Cymbalta helping her anxiety and depression and she denies any recent anxiety attack, crying spells or any feeling of hopelessness or worthlessness. She is taking beta-blocker and losartan that is helping her PVCs and blood pressure. She enjoys working outside at barn. Her husband is doing well. She is sleeping good. She has no tremors shakes or any EPS. She like to keep her current medication.   Past Psychiatric History: H/Odepressionand anxiety. Startedmedication after her son was born. TookPaxil, Zoloft and propanolol for anxiety. Lamictal caused rash. No h/oinpatient or suicidal.H/O episodic hypomanic-like symptomsas more energy and poor sleep but able to function. No h/oparanoiaandhallucination. SawParrish McKinney for10 years.   Psychiatric Specialty Exam: Physical Exam  Review of Systems  Weight 143 lb (64.9 kg), last menstrual period 06/19/2020.There is no height or weight on file to calculate BMI.  General Appearance: NA  Eye Contact:  NA  Speech:  Clear and Coherent  Volume:  Normal  Mood:  Euthymic   Affect:  NA  Thought Process:  Goal Directed  Orientation:  Full (Time, Place, and Person)  Thought Content:  Logical  Suicidal Thoughts:  No  Homicidal Thoughts:  No  Memory:  Immediate;   Good Recent;   Good Remote;   Good  Judgement:  Good  Insight:  Good  Psychomotor Activity:  NA  Concentration:  Concentration: Good and Attention Span: Good  Recall:  Good  Fund of Knowledge:  Good  Language:  Good  Akathisia:  No  Handed:  Right  AIMS (if indicated):     Assets:  Communication Skills Desire for Improvement Housing Resilience Social Support Talents/Skills Transportation  ADL's:  Intact  Cognition:  WNL  Sleep:   good      Assessment and Plan: Major depressive disorder, recurrent. Anxiety.  Patient is a stable on her current medication. Continue Cymbalta 60 mg daily. Recommended to call us back if she has any question or any concern. Follow-up in 6 months.  Follow Up Instructions:    I discussed the assessment and treatment plan with the patient. The patient was provided an opportunity to ask questions and all were answered. The patient agreed with the plan and demonstrated an understanding of the instructions.   The patient was advised to call back or seek an in-person evaluation if the symptoms worsen or if the condition fails to improve as anticipated.  I provided 16 minutes of non-face-to-face time during this encounter.   Kathlee Nations, MD

## 2020-11-18 ENCOUNTER — Other Ambulatory Visit: Payer: Self-pay | Admitting: Family

## 2020-11-18 MED ORDER — LOSARTAN POTASSIUM 100 MG PO TABS: 100.0000 mg | ORAL_TABLET | Freq: Every day | ORAL | 0 refills | Status: DC

## 2020-12-11 DIAGNOSIS — M25511 Pain in right shoulder: Secondary | ICD-10-CM | POA: Diagnosis not present

## 2021-01-07 ENCOUNTER — Encounter (HOSPITAL_COMMUNITY): Payer: Self-pay | Admitting: Psychiatry

## 2021-01-16 DIAGNOSIS — M25511 Pain in right shoulder: Secondary | ICD-10-CM | POA: Diagnosis not present

## 2021-02-08 ENCOUNTER — Other Ambulatory Visit: Payer: Self-pay | Admitting: Family

## 2021-02-09 ENCOUNTER — Other Ambulatory Visit: Payer: Self-pay | Admitting: Family

## 2021-02-09 NOTE — Telephone Encounter (Signed)
Pt notified of medication change.  CPE scheduled for 03/09/21. Sending pt new mychart activation code.

## 2021-02-09 NOTE — Telephone Encounter (Signed)
Please let her know that Losartan is on national backorder with no date known for when this will change; Sent in Diovan 160 mg daily as alternative; she is due for OV- last seen here on 10/23/2019;

## 2021-02-26 DIAGNOSIS — Z6828 Body mass index (BMI) 28.0-28.9, adult: Secondary | ICD-10-CM | POA: Diagnosis not present

## 2021-02-26 DIAGNOSIS — Z01419 Encounter for gynecological examination (general) (routine) without abnormal findings: Secondary | ICD-10-CM | POA: Diagnosis not present

## 2021-02-26 DIAGNOSIS — Z1231 Encounter for screening mammogram for malignant neoplasm of breast: Secondary | ICD-10-CM | POA: Diagnosis not present

## 2021-02-26 LAB — HM PAP SMEAR: HM Pap smear: NORMAL

## 2021-03-09 ENCOUNTER — Other Ambulatory Visit: Payer: Self-pay

## 2021-03-09 ENCOUNTER — Encounter: Payer: Self-pay | Admitting: Family

## 2021-03-09 ENCOUNTER — Ambulatory Visit (INDEPENDENT_AMBULATORY_CARE_PROVIDER_SITE_OTHER): Payer: BC Managed Care – PPO | Admitting: Family

## 2021-03-09 VITALS — BP 142/90 | HR 73 | Temp 98.5°F | Ht 61.0 in | Wt 149.8 lb

## 2021-03-09 DIAGNOSIS — Z0000 Encounter for general adult medical examination without abnormal findings: Secondary | ICD-10-CM | POA: Diagnosis not present

## 2021-03-09 DIAGNOSIS — I10 Essential (primary) hypertension: Secondary | ICD-10-CM

## 2021-03-09 DIAGNOSIS — R0681 Apnea, not elsewhere classified: Secondary | ICD-10-CM | POA: Diagnosis not present

## 2021-03-09 DIAGNOSIS — Z13220 Encounter for screening for lipoid disorders: Secondary | ICD-10-CM

## 2021-03-09 LAB — CBC WITH DIFFERENTIAL/PLATELET
Basophils Absolute: 0 10*3/uL (ref 0.0–0.1)
Basophils Relative: 0.8 % (ref 0.0–3.0)
Eosinophils Absolute: 0.3 10*3/uL (ref 0.0–0.7)
Eosinophils Relative: 4.5 % (ref 0.0–5.0)
HCT: 40.7 % (ref 36.0–46.0)
Hemoglobin: 14 g/dL (ref 12.0–15.0)
Lymphocytes Relative: 28.1 % (ref 12.0–46.0)
Lymphs Abs: 1.8 10*3/uL (ref 0.7–4.0)
MCHC: 34.4 g/dL (ref 30.0–36.0)
MCV: 84.2 fl (ref 78.0–100.0)
Monocytes Absolute: 0.4 10*3/uL (ref 0.1–1.0)
Monocytes Relative: 6.4 % (ref 3.0–12.0)
Neutro Abs: 3.8 10*3/uL (ref 1.4–7.7)
Neutrophils Relative %: 60.2 % (ref 43.0–77.0)
Platelets: 266 10*3/uL (ref 150.0–400.0)
RBC: 4.83 Mil/uL (ref 3.87–5.11)
RDW: 13.1 % (ref 11.5–15.5)
WBC: 6.4 10*3/uL (ref 4.0–10.5)

## 2021-03-09 LAB — LIPID PANEL
Cholesterol: 206 mg/dL — ABNORMAL HIGH (ref 0–200)
HDL: 72.8 mg/dL (ref >39.00)
LDL Cholesterol: 113 mg/dL — ABNORMAL HIGH (ref 0–99)
NonHDL: 132.99
Total CHOL/HDL Ratio: 3
Triglycerides: 101 mg/dL (ref 0.0–149.0)
VLDL: 20.2 mg/dL (ref 0.0–40.0)

## 2021-03-09 LAB — COMPREHENSIVE METABOLIC PANEL
ALT: 33 U/L (ref 0–35)
AST: 21 U/L (ref 0–37)
Albumin: 4.6 g/dL (ref 3.5–5.2)
Alkaline Phosphatase: 80 U/L (ref 39–117)
BUN: 22 mg/dL (ref 6–23)
CO2: 29 mEq/L (ref 19–32)
Calcium: 9.5 mg/dL (ref 8.4–10.5)
Chloride: 104 mEq/L (ref 96–112)
Creatinine, Ser: 0.81 mg/dL (ref 0.40–1.20)
GFR: 81.25 mL/min (ref >60.00)
Glucose, Bld: 87 mg/dL (ref 70–99)
Potassium: 4.1 mEq/L (ref 3.5–5.1)
Sodium: 140 mEq/L (ref 135–145)
Total Bilirubin: 0.3 mg/dL (ref 0.2–1.2)
Total Protein: 7.1 g/dL (ref 6.0–8.3)

## 2021-03-09 LAB — TSH: TSH: 2.64 u[IU]/mL (ref 0.35–4.50)

## 2021-03-09 NOTE — Progress Notes (Signed)
Stephanie Gray is a 55 y.o. female with the following history as recorded in [employer]:  Patient Active Problem List   Diagnosis Date Noted  . PVC's (premature ventricular contractions)   . Atrial tachycardia, paroxysmal ([place])   . Family history of premature CAD 11/21/2019  . Family history of bicuspid aortic valve 11/21/2019  . Hypothyroidism 03/17/2018  . Menorrhagia 03/17/2018  . Perimenopausal disorder 03/17/2018  . Lightheadedness 03/17/2018  . Neuromuscular disorder ([place])   . Depression   . Arthritis   . Anxiety   . Apocrine metaplasia of breast, right 03/03/2018  . Breast mass, right 04/12/2017  . Hypertension 06/28/2016  . Routine general medical examination at a health care facility 04/06/2016  . Elevated blood pressure reading without diagnosis of hypertension 06/12/2015  . Tubular adenoma of colon 04/27/2015  . Nonspecific abnormal results of thyroid function study 04/14/2009  . DEPRESSION 04/14/2009  . NONSPECIFIC ABNORMAL ELECTROCARDIOGRAM 04/14/2009    Current Outpatient Medications  Medication Sig Dispense Refill  . acetaminophen (TYLENOL) 325 MG tablet Take 2 tablets (650 mg total) by mouth every 6 (six) hours as needed. 100 tablet 2  . calcium-vitamin D (OSCAL WITH D) 250-125 MG-UNIT tablet Take 1 tablet by mouth daily. Takes both separate and together.    . DULoxetine (CYMBALTA) 60 MG capsule Take 1 capsule (60 mg total) by mouth daily. 90 capsule 1  . losartan (COZAAR) 100 MG tablet     . metoprolol succinate (TOPROL XL) 25 MG 24 hr tablet Take 1 tablet (25 mg total) by mouth daily. 90 tablet 3   No current facility-administered medications for this visit.    Allergies: Benzodiazepines, Other, Augmentin [amoxicillin-pot clavulanate], Lamictal [lamotrigine], and Penicillins  Past Medical History:  Diagnosis Date  . Anxiety   . Apocrine metaplasia of breast, right 03/03/2018  . Arthritis   . Atrial tachycardia, paroxysmal ([place])    noted on event  monitor 02/2020  . Breast mass, right 04/12/2017  . Cervical disc disease   . Complication of anesthesia    very sensitive to all mdications,needs lower dosage  . Depression   . Hypertension   . Hypothyroidism 03/17/2018  . Neuromuscular disorder ([facility])    numbness in 2 fingers of left hand  . Perimenopausal disorder 03/17/2018  . Premature atrial contractions   . PVC's (premature ventricular contractions)    noted on heart monitor 02/2020  . Routine general medical examination at a health care facility 04/06/2016  . Tubular adenoma of colon 04/27/2015   04/07/2015 @ 70 cm ,polyp; Dr Collene Mares Due 2019     Past Surgical History:  Procedure Laterality Date  . APPENDECTOMY    . BLADDER REPAIR    . BREAST LUMPECTOMY WITH RADIOACTIVE SEED LOCALIZATION Right 02/10/2017   Procedure: RIGHT BREAST LUMPECTOMY WITH RADIOACTIVE SEED LOCALIZATION;  Surgeon: Autumn Messing III, MD;  Location: [facility];  Service: General;  Laterality: Right;  . CESAREAN SECTION     X 2, Dr Reino Kent  . CHOLECYSTECTOMY N/A 07/11/2020   Procedure: LAPAROSCOPIC CHOLECYSTECTOMY;  Surgeon: Donnie Mesa, MD;  Location: [facility];  Service: General;  Laterality: N/A;  . EYE SURGERY Bilateral    Lasik   . TUBAL LIGATION     bladder laceration repair    Family History  Problem Relation Age of Onset  . Hypertension Mother   . Other Mother        congenital heart defect - bicuspid aortic valve  . Diabetes Father   . Heart disease Father   .  Hypertension Father   . Stroke Maternal Grandfather 72       dementia  . Cancer Paternal Grandfather        testicular  . Mitral valve prolapse Maternal Grandmother   . Heart disease Paternal Grandmother   . Breast cancer Paternal Grandmother   . Other Sister        neurologic issue    Social History   Tobacco Use  . Smoking status: Never Smoker  . Smokeless tobacco: Never Used  Substance Use Topics  . 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   HENT: Negative.   Eyes: Negative.   Respiratory: Negative.   Cardiovascular: Negative.   Gastrointestinal: Negative.   Genitourinary: Negative.   Musculoskeletal: Negative.   Skin: Negative.   Neurological: Negative.   Endo/Heme/Allergies: Negative.   Psychiatric/Behavioral: Negative.      Objective:  Vitals:   03/09/21 1101  BP: (!) 142/90  Pulse: 73  Temp: 98.5 F (36.9 C)  TempSrc: Oral  SpO2: 98%  Weight: 149 lb 12.8 oz (67.9 kg)  Height: _0  (1.549 m)    General: Well developed, well nourished, in no acute distress  Skin : Warm and dry.  Head: Normocephalic and atraumatic  Eyes: Sclera and conjunctiva clear; pupils round and reactive to light; extraocular movements intact  Ears: External normal; canals clear; tympanic membranes normal  Oropharynx: Pink, supple. No suspicious lesions  Neck: Supple without thyromegaly, adenopathy  Lungs: Respirations unlabored; clear to auscultation bilaterally without wheeze, rales, rhonchi  CVS exam: normal rate and regular rhythm.  Abdomen: Soft; nontender; nondistended; normoactive bowel sounds; no masses or hepatosplenomegaly  Musculoskeletal: No deformities; no active joint inflammation  Extremities: No edema, cyanosis, clubbing  Vessels: Symmetric bilaterally  Neurologic: Alert and oriented; speech intact; face symmetrical; moves all extremities well; CNII-XII intact without focal deficit   Assessment:  1. PE (physical exam), annual   2. Essential hypertension   3. Witnessed episode of apnea   4. Lipid screening     Plan:  Age appropriate preventive healthcare needs addressed; encouraged  regular eye doctor and dental exams; encouraged regular exercise; will update labs and refills as needed today; follow-up to be determined; Continue Losartan 100 mg daily; Will try increase Toprol XL to 50 mg daily- she will call back in 1-2 weeks with response; if not improved, lower Toprol XL back to 25 mg daily and add Amlodipine 2.5 mg; Referral for sleep study;  This visit occurred during the SARS-CoV-2 public health emergency.  Safety protocols were in place, including screening questions prior to the visit, additional usage of staff PPE, and extensive cleaning of exam room while observing appropriate contact time as indicated for disinfecting solutions.    No follow-ups on file.  Orders Placed This Encounter  Procedures  . CBC with Differential/Platelet    Standing Status:   Future    Number of Occurrences:   1    Standing Expiration Date:   03/09/2022  . Comp Met (CMET)    Standing Status:   Future    Number of Occurrences:  1    Standing Expiration Date:   03/09/2022  . Lipid panel    Standing Status:   Future    Number of Occurrences:   1    Standing Expiration Date:   03/09/2022  . TSH    Standing Status:   Future    Number of Occurrences:   1    Standing Expiration Date:   03/09/2022  . Ambulatory referral to Neurology    Referral Priority:   Routine    Referral Type:   Consultation    Referral Reason:   Specialty Services Required    Requested Specialty:   Neurology    Number of Visits Requested:   1    Requested Prescriptions    No prescriptions requested or ordered in this encounter

## 2021-03-30 ENCOUNTER — Other Ambulatory Visit: Payer: Self-pay

## 2021-03-30 ENCOUNTER — Telehealth (INDEPENDENT_AMBULATORY_CARE_PROVIDER_SITE_OTHER): Payer: BC Managed Care – PPO | Admitting: Psychiatry

## 2021-03-30 ENCOUNTER — Encounter (HOSPITAL_COMMUNITY): Payer: Self-pay | Admitting: Psychiatry

## 2021-03-30 DIAGNOSIS — F330 Major depressive disorder, recurrent, mild: Secondary | ICD-10-CM

## 2021-03-30 DIAGNOSIS — F419 Anxiety disorder, unspecified: Secondary | ICD-10-CM | POA: Diagnosis not present

## 2021-03-30 MED ORDER — DULOXETINE HCL 60 MG PO CPEP
60.0000 mg | ORAL_CAPSULE | Freq: Every day | ORAL | 1 refills | Status: DC
Start: 2021-03-30 — End: 2021-10-30

## 2021-03-30 NOTE — Progress Notes (Signed)
Virtual Visit via Telephone Note  I connected with Stephanie Gray on 03/30/21 at  2:00 PM EDT by telephone and verified that I am speaking with the correct person using two identifiers.  Location: Patient: In Car Provider: Home Office   I discussed the limitations, risks, security and privacy concerns of performing an evaluation and management service by telephone and the availability of in person appointments. I also discussed with the patient that there may be a patient responsible charge related to this service. The patient expressed understanding and agreed to proceed.   History of Present Illness: Patient is evaluated by phone session.  She is taking Cymbalta daily and reported her anxiety depression is under control.  Sometimes she has trouble staying asleep.  Lately she noticed that she tried to catch the gas for air in her sleep.  She did talk to her PCP and now trying to schedule sleep study.  She also gained weight in past few months.  She reported sometimes tired which she believes due to lack of sleep.  Overall she feels her anxiety and depression is under control.  She has no tremors shakes or any EPS.  Recently she had blood work and her cholesterol is high and her LDL is also high.  She is taking beta-blocker and blood pressure medicine which is preventing her PVCs.  Her husband is doing well.  She denies any anhedonia, feeling of hopelessness or any crying spells.  She denies any suicidal thoughts.  She like to keep the current medication.     Past Psychiatric History: H/Odepressionand anxiety. Startedmedication after her son was born. TookPaxil, Zoloft and propanolol for anxiety. Lamictal causedrash. No h/oinpatientor suicidal.H/O episodic hypomanic-like symptomsas more energy and poor sleepbut able to function.No h/oparanoiaandhallucination. SawParrish McKinney for10 years.  Recent Results (from the past 2160 hour(s))  TSH     Status: None    Collection Time: 03/09/21 11:47 AM  Result Value Ref Range   TSH 2.64 0.35 - 4.50 uIU/mL  Lipid panel     Status: Abnormal   Collection Time: 03/09/21 11:47 AM  Result Value Ref Range   Cholesterol 206 (H) 0 - 200 mg/dL    Comment: ATP III Classification       Desirable:  < 200 mg/dL               Borderline High:  200 - 239 mg/dL          High:  > = 240 mg/dL   Triglycerides 101.0 0.0 - 149.0 mg/dL    Comment: Normal:  <150 mg/dLBorderline High:  150 - 199 mg/dL   HDL 72.80 >39.00 mg/dL   VLDL 20.2 0.0 - 40.0 mg/dL   LDL Cholesterol 113 (H) 0 - 99 mg/dL   Total CHOL/HDL Ratio 3     Comment:                Men          Women1/2 Average Risk     3.4          3.3Average Risk          5.0          4.42X Average Risk          9.6          7.13X Average Risk          15.0          11.0  NonHDL 132.99     Comment: NOTE:  Non-HDL goal should be 30 mg/dL higher than patient's LDL goal (i.e. LDL goal of < 70 mg/dL, would have non-HDL goal of < 100 mg/dL)  Comp Met (CMET)     Status: None   Collection Time: 03/09/21 11:47 AM  Result Value Ref Range   Sodium 140 135 - 145 mEq/L   Potassium 4.1 3.5 - 5.1 mEq/L   Chloride 104 96 - 112 mEq/L   CO2 29 19 - 32 mEq/L   Glucose, Bld 87 70 - 99 mg/dL   BUN 22 6 - 23 mg/dL   Creatinine, Ser 0.81 0.40 - 1.20 mg/dL   Total Bilirubin 0.3 0.2 - 1.2 mg/dL   Alkaline Phosphatase 80 39 - 117 U/L   AST 21 0 - 37 U/L   ALT 33 0 - 35 U/L   Total Protein 7.1 6.0 - 8.3 g/dL   Albumin 4.6 3.5 - 5.2 g/dL   GFR 81.25 >60.00 mL/min    Comment: Calculated using the CKD-EPI Creatinine Equation (2021)   Calcium 9.5 8.4 - 10.5 mg/dL  CBC with Differential/Platelet     Status: None   Collection Time: 03/09/21 11:47 AM  Result Value Ref Range   WBC 6.4 4.0 - 10.5 K/uL   RBC 4.83 3.87 - 5.11 Mil/uL   Hemoglobin 14.0 12.0 - 15.0 g/dL   HCT 40.7 36.0 - 46.0 %   MCV 84.2 78.0 - 100.0 fl   MCHC 34.4 30.0 - 36.0 g/dL   RDW 13.1 11.5 - 15.5 %    Platelets 266.0 150.0 - 400.0 K/uL   Neutrophils Relative % 60.2 43.0 - 77.0 %   Lymphocytes Relative 28.1 12.0 - 46.0 %   Monocytes Relative 6.4 3.0 - 12.0 %   Eosinophils Relative 4.5 0.0 - 5.0 %   Basophils Relative 0.8 0.0 - 3.0 %   Neutro Abs 3.8 1.4 - 7.7 K/uL   Lymphs Abs 1.8 0.7 - 4.0 K/uL   Monocytes Absolute 0.4 0.1 - 1.0 K/uL   Eosinophils Absolute 0.3 0.0 - 0.7 K/uL   Basophils Absolute 0.0 0.0 - 0.1 K/uL    Psychiatric Specialty Exam: Physical Exam  Review of Systems  Weight 147 lb (66.7 kg), last menstrual period 06/19/2020.There is no height or weight on file to calculate BMI.  General Appearance: NA  Eye Contact:  NA  Speech:  Clear and Coherent and Normal Rate  Volume:  Normal  Mood:  Euthymic  Affect:  NA  Thought Process:  Goal Directed  Orientation:  Full (Time, Place, and Person)  Thought Content:  WDL  Suicidal Thoughts:  No  Homicidal Thoughts:  No  Memory:  Immediate;   Good Recent;   Good Remote;   Good  Judgement:  Good  Insight:  Present  Psychomotor Activity:  NA  Concentration:  Concentration: Good and Attention Span: Good  Recall:  Good  Fund of Knowledge:  Good  Language:  Good  Akathisia:  No  Handed:  Right  AIMS (if indicated):     Assets:  Communication Skills Desire for Improvement Housing  ADL's:  Intact  Cognition:  WNL  Sleep:   fair      Assessment and Plan: Major depressive disorder, recurrent.  Anxiety the  Patient is stable on Cymbalta 60 mg daily.  We talked about getting her sleep study and watch her calorie intake.  Patient may have underlying CPAP and hoping to have sleep study soon.  She  wants to keep the current medication Cymbalta 60 mg daily.  She has no tremors shakes or any EPS.  Recommended to call us back if he has any question or any concern.  Follow-up in 6 months.  Follow Up Instructions:    I discussed the assessment and treatment plan with the patient. The patient was provided an opportunity to ask  questions and all were answered. The patient agreed with the plan and demonstrated an understanding of the instructions.   The patient was advised to call back or seek an in-person evaluation if the symptoms worsen or if the condition fails to improve as anticipated.  I provided 17 minutes of non-face-to-face time during this encounter.   Kathlee Nations, MD

## 2021-04-27 ENCOUNTER — Encounter: Payer: Self-pay | Admitting: Neurology

## 2021-04-27 ENCOUNTER — Ambulatory Visit (INDEPENDENT_AMBULATORY_CARE_PROVIDER_SITE_OTHER): Payer: BC Managed Care – PPO | Admitting: Neurology

## 2021-04-27 VITALS — BP 122/79 | HR 58 | Ht 61.4 in | Wt 145.6 lb

## 2021-04-27 DIAGNOSIS — R351 Nocturia: Secondary | ICD-10-CM | POA: Diagnosis not present

## 2021-04-27 DIAGNOSIS — R519 Headache, unspecified: Secondary | ICD-10-CM | POA: Diagnosis not present

## 2021-04-27 DIAGNOSIS — R0681 Apnea, not elsewhere classified: Secondary | ICD-10-CM | POA: Diagnosis not present

## 2021-04-27 DIAGNOSIS — G4719 Other hypersomnia: Secondary | ICD-10-CM

## 2021-04-27 DIAGNOSIS — G4700 Insomnia, unspecified: Secondary | ICD-10-CM

## 2021-04-27 DIAGNOSIS — E663 Overweight: Secondary | ICD-10-CM

## 2021-04-27 DIAGNOSIS — I493 Ventricular premature depolarization: Secondary | ICD-10-CM

## 2021-04-27 DIAGNOSIS — R0683 Snoring: Secondary | ICD-10-CM | POA: Diagnosis not present

## 2021-04-27 DIAGNOSIS — R002 Palpitations: Secondary | ICD-10-CM

## 2021-04-27 NOTE — Progress Notes (Signed)
Subjective:    Patient ID: Stephanie Gray is a 55 y.o. female.  HPI     Star Age, MD, PhD [facility] [address], [address] P.O. [place],  [address]  Dear Mickel Baas,   I saw your patient, Stephanie Gray, upon your kind request, in my sleep clinic today for initial consultation of her sleep disorder, in particular, concern for underlying obstructive sleep apnea.  The patient is unaccompanied today.  As you know, [name] is a 55 year old right-handed woman with an underlying medical history of hypertension, hypothyroidism, PVCs, arthritis, paroxysmal atrial tachycardia, cervical disc disease, depression, anxiety, and overweight state, who reports snoring and excessive daytime somnolence as well as witnessed apneas.  I reviewed your office note from 03/09/2021.  Her Epworth sleepiness score is 14 out of 24, fatigue severity score is 44 out of 63.  She has had occasional morning headaches.  Of note, she takes an Advil once a day for pain, not just for morning headaches.  She works on her horse farm.  Bedtime is generally between 8 and 9 PM, but she does have difficulty falling asleep.  She has no family history of sleep apnea.  She has 5 dogs, one that sleeps with her.  Her son had noticed pauses in her breathing and her dog will often wake her up from dream sleep.  She has nocturia about 4 times per average night.  She lives with her husband, she has 5 sons, 2 biological.  She reports difficulty with sleep maintenance and hot flashes.  She has had menopausal symptoms and had taken Prempro in the past.  She takes melatonin at night, unsure of the dose.  She drinks caffeine in the form of coffee, generally 1/day, occasional unsweetened tea.  She is a non-smoker and does not drink alcohol.    Her Past Medical History Is Significant For: Past Medical History:  Diagnosis Date  . Anxiety   . Apocrine metaplasia of breast, right 03/03/2018  . Arthritis    . Atrial tachycardia, paroxysmal ([place])    noted on event monitor 02/2020  . Breast mass, right 04/12/2017  . Cervical disc disease   . Complication of anesthesia    very sensitive to all mdications,needs lower dosage  . Depression   . Hypertension   . Hypothyroidism 03/17/2018  . Neuromuscular disorder (HCC)    numbness in 2 fingers of left hand  . Perimenopausal disorder 03/17/2018  . Premature atrial contractions   . PVC's (premature ventricular contractions)    noted on heart monitor 02/2020  . Routine general medical examination at a health care facility 04/06/2016  . Tubular adenoma of colon 04/27/2015   04/07/2015 @ 70 cm ,polyp; Dr Collene Mares Due 2019     Her Past Surgical History Is Significant For: Past Surgical History:  Procedure Laterality Date  . APPENDECTOMY    . BLADDER REPAIR    . BREAST LUMPECTOMY WITH RADIOACTIVE SEED LOCALIZATION Right 02/10/2017   Procedure: RIGHT BREAST LUMPECTOMY WITH RADIOACTIVE SEED LOCALIZATION;  Surgeon: Autumn Messing III, MD;  Location: [place];  Service: General;  Laterality: Right;  . CESAREAN SECTION     X 2, Dr Reino Kent  . CHOLECYSTECTOMY N/A 07/11/2020   Procedure: LAPAROSCOPIC CHOLECYSTECTOMY;  Surgeon: Donnie Mesa, MD;  Location: [place];  Service: General;  Laterality: N/A;  . EYE SURGERY Bilateral    Lasik   . TUBAL LIGATION     bladder laceration repair    Her Family History Is  Significant For: Family History  Problem Relation Age of Onset  . Hypertension Mother   . Other Mother        congenital heart defect - bicuspid aortic valve  . Diabetes Father   . Heart disease Father   . Hypertension Father   . Stroke Maternal Grandfather 72       dementia  . Cancer Paternal Grandfather        testicular  . Mitral valve prolapse Maternal Grandmother   . Heart disease Paternal Grandmother   . Breast cancer Paternal Grandmother   . Other Sister        neurologic issue    Her Social History Is Significant For: Social History    Socioeconomic History  . Marital status: Married    Spouse name: Not on file  . Number of children: 2  . Years of education: 74  . Highest education level: Not on file  Occupational History  . Occupation: Horse Farm  Tobacco Use  . Smoking status: Never Smoker  . Smokeless tobacco: Never Used  Vaping Use  . Vaping Use: Never used  Substance and Sexual Activity  . Alcohol use: Yes    Comment: Rarely  . Drug use: No  . Sexual activity: Yes    Birth control/protection: Surgical    Comment: TBL  Other Topics Concern  . Not on file  Social History Narrative   ** Merged History Encounter **       Fun: Ride horses. Denies abuse and feels safe at home.    Social Determinants of Health   Financial Resource Strain: Not on file  Food Insecurity: Not on file  Transportation Needs: Not on file  Physical Activity: Not on file  Stress: Not on file  Social Connections: Not on file    Her Allergies Are:  Allergies  Allergen Reactions  . Benzodiazepines Other (See Comments)    Patient needed crash cart after tubal ligation  . Other Other (See Comments)    Patient is extremely sensitive to all medicines, maybe needs 1/4 or 1/8 dosage of normal adult  . Augmentin [Amoxicillin-Pot Clavulanate] Other (See Comments)  . Lamictal [Lamotrigine] Rash  . Penicillins Rash    Rash because of a history of documented adverse serious drug reaction;Medi Alert bracelet  is recommended (CHILDHOOD REACTION) Has patient had a PCN reaction causing immediate rash, facial/tongue/throat swelling, SOB or lightheadedness with hypotension:UNKNOWN Has patient had a PCN reaction causing severe rash involving mucus membranes or skin necrosis:unsure Has patient had a PCN reaction that required hospitalization:unsure Has patient had a PCN reaction occurring within the last 10 years:unsure   :   Her Current Medications Are:  Outpatient Encounter Medications as of 04/27/2021  Medication Sig  . acetaminophen  (TYLENOL) 325 MG tablet Take 2 tablets (650 mg total) by mouth every 6 (six) hours as needed.  . DULoxetine (CYMBALTA) 60 MG capsule Take 1 capsule (60 mg total) by mouth daily.  . metoprolol succinate (TOPROL XL) 25 MG 24 hr tablet Take 1 tablet (25 mg total) by mouth daily.  . Turmeric 500 MG CAPS Take by mouth.  . valsartan (DIOVAN) 160 MG tablet Take 160 mg by mouth daily.  . [DISCONTINUED] calcium-vitamin D (OSCAL WITH D) 250-125 MG-UNIT tablet Take 1 tablet by mouth daily. Takes both separate and together. (Patient not taking: Reported on 04/27/2021)   No facility-administered encounter medications on file as of 04/27/2021.  :   Review of Systems:  Out of a complete 14  point review of systems, all are reviewed and negative with the exception of these symptoms as listed below:  Review of Systems  Neurological:       Here for sleep consult. No prior sleep study. Reports snoring, insomnia, sleepiness. She also reports her son has witnessed apneic like events.  Epworth Sleepiness Scale 0= would never doze 1= slight chance of dozing 2= moderate chance of dozing 3= high chance of dozing  Sitting and reading:2 Watching TV:2 Sitting inactive in a public place (ex. Theater or meeting):1 As a passenger in a car for an hour without a break:2 Lying down to rest in the afternoon:3 Sitting and talking to someone:1 Sitting quietly after lunch (no alcohol):2 In a car, while stopped in traffic:1 Total:14     Objective:  Neurological Exam  Physical Exam Physical Examination:   Vitals:   04/27/21 0901  BP: 122/79  Pulse: (!) 58    General Examination: The patient is a very pleasant 55 y.o. female in no acute distress. She appears well-developed and well-nourished and well groomed.   HEENT: Normocephalic, atraumatic, pupils are equal, round and reactive to light, extraocular tracking is good without limitation to gaze excursion or nystagmus noted. Hearing is grossly intact. Face is  symmetric with normal facial animation. Speech is clear with no dysarthria noted. There is no hypophonia. There is no lip, neck/head, jaw or voice tremor. Neck is supple with full range of passive and active motion. There are no carotid bruits on auscultation. Oropharynx exam reveals: mild mouth dryness, good dental hygiene and mild airway crowding, due to smaller airway entry. Mallampati is class II. Tongue protrudes centrally and palate elevates symmetrically. Tonsils are small. She has a mild to moderate overbite.  Tongue protrudes centrally and palate elevates symmetrically.    Chest: Clear to auscultation without wheezing, rhonchi or crackles noted.  Heart: S1+S2+0, regular and normal without murmurs, rubs or gallops noted.   Abdomen: Soft, non-tender and non-distended.  Extremities: There is no pitting edema in the distal lower extremities bilaterally.   Skin: Warm and dry without trophic changes noted.   Musculoskeletal: exam reveals no obvious joint deformities, tenderness or joint swelling or erythema.   Neurologically:  Mental status: The patient is awake, alert and oriented in all 4 spheres. Her immediate and remote memory, attention, language skills and fund of knowledge are appropriate. There is no evidence of aphasia, agnosia, apraxia or anomia. Speech is clear with normal prosody and enunciation. Thought process is linear. Mood is normal and affect is normal.  Cranial nerves II - XII are as described above under HEENT exam.  Motor exam: Normal bulk, strength and tone is noted. There is no tremor, Romberg is negative. Fine motor skills and coordination: grossly intact.  Cerebellar testing: No dysmetria or intention tremor. There is no truncal or gait ataxia.  Sensory exam: intact to light touch in the upper and lower extremities.  Gait, station and balance: She stands easily. No veering to one side is noted. No leaning to one side is noted. Posture is age-appropriate and stance is  narrow based. Gait shows normal stride length and normal pace. No problems turning are noted. Tandem walk is unremarkable.                Assessment and Plan:   In summary, Miosotis I Beber is a very pleasant 55 y.o.-year old female with an underlying medical history of hypertension, hypothyroidism, PVCs, arthritis, paroxysmal atrial tachycardia, cervical disc disease, depression, anxiety, and overweight  state, whose history and physical exam are concerning for obstructive sleep apnea (OSA). I had a long chat with the patient about my findings and the diagnosis of OSA, its prognosis and treatment options. We talked about medical treatments, surgical interventions and non-pharmacological approaches. I explained in particular the risks and ramifications of untreated moderate to severe OSA, especially with respect to developing cardiovascular disease down the Road, including congestive heart failure, difficult to treat hypertension, cardiac arrhythmias, or stroke. Even type 2 diabetes has, in part, been linked to untreated OSA. Symptoms of untreated OSA include daytime sleepiness, memory problems, mood irritability and mood disorder such as depression and anxiety, lack of energy, as well as recurrent headaches, especially morning headaches. We talked about trying to maintain a healthy lifestyle in general, as well as the importance of weight control. We also talked about the importance of good sleep hygiene. I recommended the following at this time: sleep study.   I explained the sleep test procedure to the patient and also outlined possible surgical and non-surgical treatment options of OSA, including the use of a custom-made dental device (which would require a referral to a specialist dentist or oral surgeon), upper airway surgical options, such as traditional UPPP or a novel less invasive surgical option in the form of Inspire hypoglossal nerve stimulation (which would involve a referral to an ENT  surgeon). I also explained the CPAP treatment option to the patient, who indicated that she would be willing to try CPAP if the need arises.  I answered all her questions today and the patient was in agreement. I plan to see her back after the sleep study is completed and encouraged her to call with any interim questions, concerns, problems or updates.   Thank you very much for allowing me to participate in the care of this nice patient. If I can be of any further assistance to you please do not hesitate to call me at [phone].  Sincerely,   Star Age, MD, PhD

## 2021-04-27 NOTE — Patient Instructions (Signed)

## 2021-05-26 ENCOUNTER — Other Ambulatory Visit: Payer: Self-pay | Admitting: Physician Assistant

## 2021-06-09 ENCOUNTER — Other Ambulatory Visit: Payer: Self-pay | Admitting: Family

## 2021-06-10 NOTE — Telephone Encounter (Signed)
Not sure about follow up and do I need to ask if she will be seeing you here?

## 2021-06-17 ENCOUNTER — Other Ambulatory Visit: Payer: Self-pay | Admitting: Family

## 2021-06-17 MED ORDER — VALSARTAN 160 MG PO TABS: 160.0000 mg | ORAL_TABLET | Freq: Every day | ORAL | 0 refills | Status: DC

## 2021-06-17 NOTE — Telephone Encounter (Signed)
I have called pt back and scheduled her for ov here at [facility] on 07/07/21 @ 3:20 since pt will follow Mickel Baas. Pt reported that the pharmacy had switched her medication due to insurance coverage and she is doing well on the Valsartan.

## 2021-07-07 ENCOUNTER — Ambulatory Visit: Payer: BC Managed Care – PPO | Admitting: Family

## 2021-07-21 ENCOUNTER — Ambulatory Visit: Payer: BC Managed Care – PPO | Admitting: Family

## 2021-07-23 ENCOUNTER — Encounter: Payer: Self-pay | Admitting: Family

## 2021-07-23 ENCOUNTER — Ambulatory Visit (INDEPENDENT_AMBULATORY_CARE_PROVIDER_SITE_OTHER): Payer: BC Managed Care – PPO | Admitting: Family

## 2021-07-23 ENCOUNTER — Other Ambulatory Visit: Payer: Self-pay

## 2021-07-23 VITALS — BP 138/82 | HR 54 | Temp 97.8°F | Ht 61.0 in | Wt 148.0 lb

## 2021-07-23 DIAGNOSIS — I10 Essential (primary) hypertension: Secondary | ICD-10-CM

## 2021-07-23 LAB — COMPREHENSIVE METABOLIC PANEL
ALT: 19 U/L (ref 0–35)
AST: 18 U/L (ref 0–37)
Albumin: 4.5 g/dL (ref 3.5–5.2)
Alkaline Phosphatase: 77 U/L (ref 39–117)
BUN: 21 mg/dL (ref 6–23)
CO2: 26 mEq/L (ref 19–32)
Calcium: 9.7 mg/dL (ref 8.4–10.5)
Chloride: 103 mEq/L (ref 96–112)
Creatinine, Ser: 0.85 mg/dL (ref 0.40–1.20)
GFR: 76.48 mL/min (ref >60.00)
Glucose, Bld: 77 mg/dL (ref 70–99)
Potassium: 4.3 mEq/L (ref 3.5–5.1)
Sodium: 138 mEq/L (ref 135–145)
Total Bilirubin: 0.3 mg/dL (ref 0.2–1.2)
Total Protein: 6.8 g/dL (ref 6.0–8.3)

## 2021-07-23 NOTE — Progress Notes (Signed)
Stephanie Gray is a 55 y.o. female with the following history as recorded in EpicCare:  Patient Active Problem List   Diagnosis Date Noted   PVC's (premature ventricular contractions)    Atrial tachycardia, paroxysmal (HCC)    Family history of premature CAD 11/21/2019   Family history of bicuspid aortic valve 11/21/2019   Hypothyroidism 03/17/2018   Menorrhagia 03/17/2018   Perimenopausal disorder 03/17/2018   Lightheadedness 03/17/2018   Neuromuscular disorder ([place])    Depression    Arthritis    Anxiety    Apocrine metaplasia of breast, right 03/03/2018   Breast mass, right 04/12/2017   Hypertension 06/28/2016   Routine general medical examination at a health care facility 04/06/2016   Elevated blood pressure reading without diagnosis of hypertension 06/12/2015   Tubular adenoma of colon 04/27/2015   Nonspecific abnormal results of thyroid function study 04/14/2009   DEPRESSION 04/14/2009   NONSPECIFIC ABNORMAL ELECTROCARDIOGRAM 04/14/2009    Current Outpatient Medications  Medication Sig Dispense Refill   acetaminophen (TYLENOL) 325 MG tablet Take 2 tablets (650 mg total) by mouth every 6 (six) hours as needed. 100 tablet 2   DULoxetine (CYMBALTA) 60 MG capsule Take 1 capsule (60 mg total) by mouth daily. 90 capsule 1   metoprolol succinate (TOPROL-XL) 25 MG 24 hr tablet TAKE 1 TABLET BY MOUTH EVERY DAY 90 tablet 0   Turmeric 500 MG CAPS Take by mouth.     valsartan (DIOVAN) 160 MG tablet Take 1 tablet (160 mg total) by mouth daily. 90 tablet 0   No current facility-administered medications for this visit.    Allergies: Benzodiazepines, Other, Augmentin [amoxicillin-pot clavulanate], Lamictal [lamotrigine], and Penicillins  Past Medical History:  Diagnosis Date   Anxiety    Apocrine metaplasia of breast, right 03/03/2018   Arthritis    Atrial tachycardia, paroxysmal ([place])    noted on event monitor 02/2020   Breast mass, right 04/12/2017   Cervical disc disease     Complication of anesthesia    very sensitive to all mdications,needs lower dosage   Depression    Hypertension    Hypothyroidism 03/17/2018   Neuromuscular disorder (HCC)    numbness in 2 fingers of left hand   Perimenopausal disorder 03/17/2018   Premature atrial contractions    PVC's (premature ventricular contractions)    noted on heart monitor 02/2020   Routine general medical examination at a health care facility 04/06/2016   Tubular adenoma of colon 04/27/2015   04/07/2015 @ 70 cm ,polyp; Dr Collene Mares Due 2019     Past Surgical History:  Procedure Laterality Date   APPENDECTOMY     BLADDER REPAIR     BREAST LUMPECTOMY WITH RADIOACTIVE SEED LOCALIZATION Right 02/10/2017   Procedure: RIGHT BREAST LUMPECTOMY WITH RADIOACTIVE SEED LOCALIZATION;  Surgeon: Autumn Messing III, MD;  Location: [facility];  Service: General;  Laterality: Right;   CESAREAN SECTION     X 2, Dr Reino Kent   CHOLECYSTECTOMY N/A 07/11/2020   Procedure: LAPAROSCOPIC CHOLECYSTECTOMY;  Surgeon: Donnie Mesa, MD;  Location: [facility];  Service: General;  Laterality: N/A;   EYE SURGERY Bilateral    Lasik    TUBAL LIGATION     bladder laceration repair    Family History  Problem Relation Age of Onset   Hypertension Mother    Other Mother        congenital heart defect - bicuspid aortic valve   Diabetes Father    Heart disease Father    Hypertension Father  Stroke Maternal Grandfather 72       dementia   Cancer Paternal Grandfather        testicular   Mitral valve prolapse Maternal Grandmother    Heart disease Paternal Grandmother    Breast cancer Paternal Grandmother    Other Sister        neurologic issue    Social History   Tobacco Use   Smoking status: Never   Smokeless tobacco: Never  Substance Use Topics   Alcohol use: Yes    Comment: Rarely    Subjective:  Follow up on hypertension; At last OV, had discussed increasing dosage of Toprol XL; however, in the interim, patient was somehow changed from Losartan to  Valsartan ( ? Pharmacy change) and she has remained on Toprol at 25 mg; does complain of fatigue but thought this was related to menopausal symptoms; is not having any problems with palpitations;   Objective:  Vitals:   07/23/21 1020  BP: 140/82  Pulse: (!) 54  Temp: 97.8 F (36.6 C)  TempSrc: Oral  SpO2: 95%  Weight: 148 lb (67.1 kg)  Height: _0  (1.549 m)    General: Well developed, well nourished, in no acute distress  Skin : Warm and dry.  Head: Normocephalic and atraumatic  Neck: Supple without thyromegaly, adenopathy  Lungs: Respirations unlabored; clear to auscultation bilaterally without wheeze, rales, rhonchi  CVS exam: normal rate and regular rhythm.  Neurologic: Alert and oriented; speech intact; face symmetrical; moves all extremities well; CNII-XII intact without focal deficit   Assessment:  1. Essential hypertension     Plan:  ? If fatigue is related to use Toprol XL- will have patient try cutting back to 12.5 mg daily; may need to stop completely if she feels better with lowered dosage; Continue Valsartan for now- can adjust this dosage as needed. Follow up in office in 3 weeks to re-check.   This visit occurred during the SARS-CoV-2 public health emergency.  Safety protocols were in place, including screening questions prior to the visit, additional usage of staff PPE, and extensive cleaning of exam room while observing appropriate contact time as indicated for disinfecting solutions.    No follow-ups on file.  Orders Placed This Encounter  Procedures   Comp Met (CMET)    Requested Prescriptions    No prescriptions requested or ordered in this encounter

## 2021-07-23 NOTE — Patient Instructions (Signed)
Let's try cutting the Toprol XL in 1/2 and take with the Valsartan; let's plan to follow up in 3-4 weeks;

## 2021-08-13 ENCOUNTER — Ambulatory Visit: Payer: BC Managed Care – PPO

## 2021-08-14 ENCOUNTER — Encounter: Payer: Self-pay | Admitting: Family

## 2021-08-14 ENCOUNTER — Ambulatory Visit (INDEPENDENT_AMBULATORY_CARE_PROVIDER_SITE_OTHER): Payer: BC Managed Care – PPO | Admitting: Family

## 2021-08-14 ENCOUNTER — Other Ambulatory Visit: Payer: Self-pay

## 2021-08-14 VITALS — BP 140/82 | HR 72 | Temp 97.8°F | Ht 62.0 in | Wt 147.0 lb

## 2021-08-14 DIAGNOSIS — I10 Essential (primary) hypertension: Secondary | ICD-10-CM | POA: Diagnosis not present

## 2021-08-14 MED ORDER — AMLODIPINE BESYLATE 2.5 MG PO TABS: 2.5000 mg | ORAL_TABLET | Freq: Every day | ORAL | 0 refills | Status: DC

## 2021-08-14 NOTE — Patient Instructions (Signed)
Take 1/2 tablet every other day x 3 days and then ever 3rd for 3 days;  Wait about 24 hours and start Amlodipine 2.5 mg daily;

## 2021-08-14 NOTE — Progress Notes (Signed)
Stephanie Gray is a 55 y.o. female with the following history as recorded in EpicCare:  Patient Active Problem List   Diagnosis Date Noted   PVC's (premature ventricular contractions)    Atrial tachycardia, paroxysmal (HCC)    Family history of premature CAD 11/21/2019   Family history of bicuspid aortic valve 11/21/2019   Hypothyroidism 03/17/2018   Menorrhagia 03/17/2018   Perimenopausal disorder 03/17/2018   Lightheadedness 03/17/2018   Neuromuscular disorder ([place])    Depression    Arthritis    Anxiety    Apocrine metaplasia of breast, right 03/03/2018   Breast mass, right 04/12/2017   Hypertension 06/28/2016   Routine general medical examination at a health care facility 04/06/2016   Elevated blood pressure reading without diagnosis of hypertension 06/12/2015   Tubular adenoma of colon 04/27/2015   Nonspecific abnormal results of thyroid function study 04/14/2009   DEPRESSION 04/14/2009   NONSPECIFIC ABNORMAL ELECTROCARDIOGRAM 04/14/2009    Current Outpatient Medications  Medication Sig Dispense Refill   acetaminophen (TYLENOL) 325 MG tablet Take 2 tablets (650 mg total) by mouth every 6 (six) hours as needed. 100 tablet 2   amLODipine (NORVASC) 2.5 MG tablet Take 1 tablet (2.5 mg total) by mouth daily. 90 tablet 0   DULoxetine (CYMBALTA) 60 MG capsule Take 1 capsule (60 mg total) by mouth daily. 90 capsule 1   metoprolol succinate (TOPROL-XL) 25 MG 24 hr tablet TAKE 1 TABLET BY MOUTH EVERY DAY (Patient taking differently: 12.5 mg.) 90 tablet 0   Turmeric 500 MG CAPS Take by mouth.     valsartan (DIOVAN) 160 MG tablet Take 1 tablet (160 mg total) by mouth daily. 90 tablet 0   No current facility-administered medications for this visit.    Allergies: Benzodiazepines, Other, Augmentin [amoxicillin-pot clavulanate], Lamictal [lamotrigine], and Penicillins  Past Medical History:  Diagnosis Date   Anxiety    Apocrine metaplasia of breast, right 03/03/2018   Arthritis     Atrial tachycardia, paroxysmal ([place])    noted on event monitor 02/2020   Breast mass, right 04/12/2017   Cervical disc disease    Complication of anesthesia    very sensitive to all mdications,needs lower dosage   Depression    Hypertension    Hypothyroidism 03/17/2018   Neuromuscular disorder (HCC)    numbness in 2 fingers of left hand   Perimenopausal disorder 03/17/2018   Premature atrial contractions    PVC's (premature ventricular contractions)    noted on heart monitor 02/2020   Routine general medical examination at a health care facility 04/06/2016   Tubular adenoma of colon 04/27/2015   04/07/2015 @ 70 cm ,polyp; Dr Collene Mares Due 2019     Past Surgical History:  Procedure Laterality Date   APPENDECTOMY     BLADDER REPAIR     BREAST LUMPECTOMY WITH RADIOACTIVE SEED LOCALIZATION Right 02/10/2017   Procedure: RIGHT BREAST LUMPECTOMY WITH RADIOACTIVE SEED LOCALIZATION;  Surgeon: Autumn Messing III, MD;  Location: [facility];  Service: General;  Laterality: Right;   CESAREAN SECTION     X 2, Dr Reino Kent   CHOLECYSTECTOMY N/A 07/11/2020   Procedure: LAPAROSCOPIC CHOLECYSTECTOMY;  Surgeon: Donnie Mesa, MD;  Location: [facility];  Service: General;  Laterality: N/A;   EYE SURGERY Bilateral    Lasik    TUBAL LIGATION     bladder laceration repair    Family History  Problem Relation Age of Onset   Hypertension Mother    Other Mother  congenital heart defect - bicuspid aortic valve   Diabetes Father    Heart disease Father    Hypertension Father    Stroke Maternal Grandfather 57       dementia   Cancer Paternal Grandfather        testicular   Mitral valve prolapse Maternal Grandmother    Heart disease Paternal Grandmother    Breast cancer Paternal Grandmother    Other Sister        neurologic issue    Social History   Tobacco Use   Smoking status: Never   Smokeless tobacco: Never  Substance Use Topics   Alcohol use: Yes    Comment: Rarely    Subjective:  Follow up on  hypertension; has been feeling much better since tapering dosage of Toprol XL; has had no further PVCs; would like to stop completely if possible; does not want to take fluid pill due to concerns for dehydration while riding; Denies any chest pain, shortness of breath, blurred vision or headache.    Objective:  Vitals:   08/14/21 1006 08/14/21 1012  BP: 140/80 140/82  Pulse: 72   Temp: 97.8 F (36.6 C)   TempSrc: Oral   SpO2: 97%   Weight: 147 lb (66.7 kg)   Height: '5\' 2"'$  (1.575 m)     General: Well developed, well nourished, in no acute distress  Skin : Warm and dry.  Head: Normocephalic and atraumatic  Lungs: Respirations unlabored; clear to auscultation bilaterally without wheeze, rales, rhonchi  CVS exam: normal rate and regular rhythm.  Neurologic: Alert and oriented; speech intact; face symmetrical; moves all extremities well; CNII-XII intact without focal deficit   Assessment:  1. Essential hypertension     Plan:  Taper off Toprol XL completely; will continue Valsartan and add Amlodipine 2.5 mg; follow up in 6 weeks to re-check;  This visit occurred during the SARS-CoV-2 public health emergency.  Safety protocols were in place, including screening questions prior to the visit, additional usage of staff PPE, and extensive cleaning of exam room while observing appropriate contact time as indicated for disinfecting solutions.    Return in about 6 weeks (around 09/25/2021).  No orders of the defined types were placed in this encounter.   Requested Prescriptions   Signed Prescriptions Disp Refills   amLODipine (NORVASC) 2.5 MG tablet 90 tablet 0    Sig: Take 1 tablet (2.5 mg total) by mouth daily.

## 2021-09-02 DIAGNOSIS — M5416 Radiculopathy, lumbar region: Secondary | ICD-10-CM | POA: Diagnosis not present

## 2021-09-04 ENCOUNTER — Other Ambulatory Visit: Payer: Self-pay | Admitting: Physician Assistant

## 2021-09-08 DIAGNOSIS — M545 Low back pain, unspecified: Secondary | ICD-10-CM | POA: Diagnosis not present

## 2021-09-08 DIAGNOSIS — M5416 Radiculopathy, lumbar region: Secondary | ICD-10-CM | POA: Diagnosis not present

## 2021-09-15 DIAGNOSIS — M5416 Radiculopathy, lumbar region: Secondary | ICD-10-CM | POA: Diagnosis not present

## 2021-09-15 DIAGNOSIS — M545 Low back pain, unspecified: Secondary | ICD-10-CM | POA: Diagnosis not present

## 2021-09-29 ENCOUNTER — Ambulatory Visit: Payer: BC Managed Care – PPO | Admitting: Family

## 2021-10-08 ENCOUNTER — Other Ambulatory Visit (HOSPITAL_COMMUNITY): Payer: Self-pay | Admitting: Psychiatry

## 2021-10-08 DIAGNOSIS — F419 Anxiety disorder, unspecified: Secondary | ICD-10-CM

## 2021-10-08 DIAGNOSIS — F330 Major depressive disorder, recurrent, mild: Secondary | ICD-10-CM

## 2021-10-13 ENCOUNTER — Other Ambulatory Visit: Payer: Self-pay

## 2021-10-13 ENCOUNTER — Ambulatory Visit (HOSPITAL_COMMUNITY)
Admission: RE | Admit: 2021-10-13 | Discharge: 2021-10-13 | Disposition: A | Discharge status: Home / Self Care | Payer: BC Managed Care – PPO | Source: Ambulatory Visit | Attending: Cardiology | Admitting: Cardiology

## 2021-10-13 ENCOUNTER — Encounter (HOSPITAL_COMMUNITY): Payer: Self-pay | Admitting: Cardiology

## 2021-10-13 VITALS — BP 140/90 | HR 78 | Wt 148.0 lb

## 2021-10-13 DIAGNOSIS — Z79899 Other long term (current) drug therapy: Secondary | ICD-10-CM | POA: Insufficient documentation

## 2021-10-13 DIAGNOSIS — E039 Hypothyroidism, unspecified: Secondary | ICD-10-CM | POA: Insufficient documentation

## 2021-10-13 DIAGNOSIS — Z8249 Family history of ischemic heart disease and other diseases of the circulatory system: Secondary | ICD-10-CM | POA: Diagnosis not present

## 2021-10-13 DIAGNOSIS — I5022 Chronic systolic (congestive) heart failure: Secondary | ICD-10-CM | POA: Insufficient documentation

## 2021-10-13 DIAGNOSIS — Z8616 Personal history of COVID-19: Secondary | ICD-10-CM | POA: Diagnosis not present

## 2021-10-13 DIAGNOSIS — R079 Chest pain, unspecified: Secondary | ICD-10-CM | POA: Diagnosis not present

## 2021-10-13 DIAGNOSIS — R0602 Shortness of breath: Secondary | ICD-10-CM

## 2021-10-13 DIAGNOSIS — I110 Hypertensive heart disease with heart failure: Secondary | ICD-10-CM | POA: Insufficient documentation

## 2021-10-13 DIAGNOSIS — R0609 Other forms of dyspnea: Secondary | ICD-10-CM | POA: Insufficient documentation

## 2021-10-13 DIAGNOSIS — R0789 Other chest pain: Secondary | ICD-10-CM | POA: Diagnosis not present

## 2021-10-13 DIAGNOSIS — I209 Angina pectoris, unspecified: Secondary | ICD-10-CM

## 2021-10-13 LAB — BASIC METABOLIC PANEL
Anion gap: 6 (ref 5–15)
BUN: 12 mg/dL (ref 6–20)
CO2: 28 mmol/L (ref 22–32)
Calcium: 9.4 mg/dL (ref 8.9–10.3)
Chloride: 105 mmol/L (ref 98–111)
Creatinine, Ser: 0.75 mg/dL (ref 0.44–1.00)
GFR, Estimated: >60 mL/min (ref >60)
Glucose, Bld: 105 mg/dL — ABNORMAL HIGH (ref 70–99)
Potassium: 4.6 mmol/L (ref 3.5–5.1)
Sodium: 139 mmol/L (ref 135–145)

## 2021-10-13 LAB — BRAIN NATRIURETIC PEPTIDE: B Natriuretic Peptide: 8 pg/mL (ref 0.0–100.0)

## 2021-10-13 MED ORDER — METOPROLOL TARTRATE 50 MG PO TABS: ORAL_TABLET | ORAL | 0 refills | Status: DC

## 2021-10-13 MED ORDER — AMLODIPINE BESYLATE 5 MG PO TABS: 5.0000 mg | ORAL_TABLET | Freq: Every day | ORAL | 3 refills | Status: DC

## 2021-10-13 NOTE — Patient Instructions (Addendum)
EKG done today.  Labs done today. We will contact you only if your labs are abnormal.  INCREASE Amlodipine to 5mg  (1 tablet) by mouth daily.   No other medication changes were made. Please continue all current medications as prescribed.  Your physician has requested that you have cardiac CT. Cardiac computed tomography (CT) is a painless test that uses an x-ray machine to take clear, detailed pictures of your heart. This has to be approved through your insurance company prior to scheduling, once approved we will contact you to schedule an appointment. 1 hour prior to this appointment please take Metoprolol 50mg  (1 tablet) by mouth daily.   Your physician recommends that you schedule a follow-up appointment soon for an echo and in 1 month with Dr. Aundra Dubin  Your physician has requested that you have an echocardiogram. Echocardiography is a painless test that uses sound waves to create images of your heart. It provides your doctor with information about the size and shape of your heart and how well your heart's chambers and valves are working. This procedure takes approximately one hour. There are no restrictions for this procedure.  If you have any questions or concerns before your next appointment please send [place] a message through [place] or call our office at [phone].    TO LEAVE A MESSAGE FOR THE NURSE SELECT OPTION 2, PLEASE LEAVE A MESSAGE INCLUDING: YOUR NAME DATE OF BIRTH CALL BACK NUMBER REASON FOR CALL**this is important as we prioritize the call backs  YOU WILL RECEIVE A CALL BACK THE SAME DAY AS LONG AS YOU CALL BEFORE 4:00 PM   Do the following things EVERYDAY: Weigh yourself in the morning before breakfast. Write it down and keep it in a log. Take your medicines as prescribed Eat low salt foods--Limit salt (sodium) to 2000 mg per day.  Stay as active as you can everyday Limit all fluids for the day to less than 2 liters   At the [facility], you and  your health needs are our priority. As part of our continuing mission to provide you with exceptional heart care, we have created designated Provider Care Teams. These Care Teams include your primary Cardiologist (physician) and Advanced Practice Providers (APPs- Physician Assistants and Nurse Practitioners) who all work together to provide you with the care you need, when you need it.   You may see any of the following providers on your designated Care Team at your next follow up: Dr Glori Bickers Dr Haynes Kerns, NP Lyda Jester, [place] Audry Riles, PharmD   Please be sure to bring in all your medications bottles to every appointment.

## 2021-10-13 NOTE — Progress Notes (Signed)
PCP: Marrian Salvage, [place] [facility]: Dr. Aundra Dubin  55 y.o. with history of HTN, PVCs/short atrial tachycardia, and hypothyroidism was self-referred for evaluation of exertional dyspnea.  Patient reports the gradual increase in dyspnea for about 2 years. She notes shortness of breath when she jumps her horse (involves a fair amount of exertion to control the horse). This has bothered her a lot.  She also notes dyspnea at the top of a flight of stairs.  No orthopnea/PND.  She has chest pain at "random times," not related to exertion.  She thinks this could be GERD.  She had palpitations in the past, was noted to have PVCs and short runs of atrial tachycardia.  She was on Toprol XL in the past but stopped it due to fatigue.  She has rare palpitations now.  She snores and has been noted to stop breathing in her sleep.  BP is mildly elevated today, has been elevated at other times as well.   ECG (personally reviewed): NSR, normal  Labs (3/22): LDL 113 Labs (8/22): K 4.3, creatinine 0.85  PMH: 1. Depression 2. HTN 3. H/o PVCs 4. H/o short atrial tachycardia runs. 5. Hypothyroidism 6. H/o COVID-19 7. Cholecystectomy 7/21 8. Echo (12/20): EF 60-65%, mild asymmetric basal septal hypertrophy, normal RV.  9. Coronary calcium score (12/20): 0 Agatston units.   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  Married.  Nonsmoker, rare ETOH.   FH: Father with MI in his 21s, mother with bicuspid aortic valve and HTN.   ROS: All systems reviewed and negative except as per HPI.   Current Outpatient Medications  Medication Sig Dispense Refill   acetaminophen (TYLENOL) 325 MG tablet Take 2 tablets (650 mg total) by mouth every 6 (six) hours as needed. 100 tablet 2   DULoxetine (CYMBALTA) 60 MG capsule Take 1 capsule (60 mg total) by mouth daily. 90 capsule 1   metoprolol tartrate (LOPRESSOR) 50 MG tablet Take 1 tablet by mouth 1 hour prior to Coronary CT. 1 tablet 0   Trolamine Salicylate (BLUE-EMU HEMP EX)  Apply 1 tablet topically in the morning and at bedtime. Hemp muscadine     Turmeric 500 MG CAPS Take by mouth.     valsartan (DIOVAN) 160 MG tablet Take 1 tablet (160 mg total) by mouth daily. 90 tablet 0   amLODipine (NORVASC) 5 MG tablet Take 1 tablet (5 mg total) by mouth daily. 90 tablet 3   No current facility-administered medications for this encounter.   BP 140/90   Pulse 78   Wt 67.1 kg (148 lb)   LMP 06/19/2020 (Approximate)   SpO2 98%   BMI 27.07 kg/m  General: NAD Neck: No JVD, no thyromegaly or thyroid nodule.  Lungs: Clear to auscultation bilaterally with normal respiratory effort. CV: Nondisplaced PMI.  Heart regular S1/S2, no S3/S4, no murmur.  No peripheral edema.  No carotid bruit.  Normal pedal pulses.  Abdomen: Soft, nontender, no hepatosplenomegaly, no distention.  Skin: Intact without lesions or rashes.  Neurologic: Alert and oriented x 3.  Psych: Normal affect. Extremities: No clubbing or cyanosis.  HEENT: Normal.   Assessment/Plan: 1. Exertional dyspnea/atypical chest pain:  This has been gradually progressive but is bothersome to her.  ECG is normal.  She is not volume overloaded on exam.  She did have a coronary calcium score of 0 Agatston units in 12/20.   - Check BNP. - I will arrange for echocardiogram.  - I will arrange for coronary CTA to assess for obstructive  CAD.  2. HTN: BP is elevated.  - Increase amlodipine to 5 mg daily  3. Suspect OSA: I will arrange for home sleep study.   Followup in 1 month after tests.   Loralie Champagne 10/13/2021

## 2021-10-15 ENCOUNTER — Ambulatory Visit: Payer: BC Managed Care – PPO | Admitting: Family

## 2021-10-15 ENCOUNTER — Other Ambulatory Visit: Payer: Self-pay | Admitting: Family

## 2021-10-20 ENCOUNTER — Ambulatory Visit: Payer: BC Managed Care – PPO | Admitting: Family

## 2021-10-20 ENCOUNTER — Telehealth (HOSPITAL_COMMUNITY): Payer: Self-pay | Admitting: *Deleted

## 2021-10-20 NOTE — Telephone Encounter (Signed)
Attempted to call patient regarding upcoming cardiac CT appointment. °Left message on voicemail with name and callback number ° °  RN Navigator Cardiac Imaging °[facility] °[phone] Office °[phone] Cell ° °

## 2021-10-20 NOTE — Telephone Encounter (Signed)
Patient returning call regarding upcoming cardiac imaging study; pt verbalizes understanding of appt date/time, parking situation and where to check in, pre-test NPO status and medications ordered, and verified current allergies; name and call back number provided for further questions should they arise  Gordy Clement RN Navigator Cardiac Imaging [facility] [phone] office [phone] cell  Patient to take 50mg  metoprolol tartrate two hours prior to cardiac CT scan.

## 2021-10-21 ENCOUNTER — Telehealth (HOSPITAL_COMMUNITY): Payer: Self-pay | Admitting: *Deleted

## 2021-10-21 ENCOUNTER — Ambulatory Visit (HOSPITAL_COMMUNITY)
Admission: RE | Admit: 2021-10-21 | Discharge: 2021-10-21 | Disposition: A | Discharge status: Home / Self Care | Payer: Self-pay | Source: Ambulatory Visit | Attending: Cardiology | Admitting: Cardiology

## 2021-10-21 ENCOUNTER — Other Ambulatory Visit: Payer: Self-pay

## 2021-10-21 ENCOUNTER — Encounter (HOSPITAL_COMMUNITY): Payer: Self-pay

## 2021-10-21 DIAGNOSIS — R079 Chest pain, unspecified: Secondary | ICD-10-CM | POA: Insufficient documentation

## 2021-10-21 MED ORDER — METOPROLOL TARTRATE 5 MG/5ML IV SOLN: 10.0000 mg | Freq: Once | INTRAVENOUS | Status: AC

## 2021-10-21 MED ORDER — NITROGLYCERIN 0.4 MG SL SUBL
SUBLINGUAL_TABLET | SUBLINGUAL | Status: AC
  Filled 2021-10-21: qty 2

## 2021-10-21 MED ORDER — IOHEXOL 350 MG/ML SOLN
95.0000 mL | Freq: Once | INTRAVENOUS | Status: AC | PRN
  Administered 2021-10-21: 95 mL via INTRAVENOUS

## 2021-10-21 MED ORDER — NITROGLYCERIN 0.4 MG SL SUBL
0.8000 mg | SUBLINGUAL_TABLET | Freq: Once | SUBLINGUAL | Status: AC
  Administered 2021-10-21: 0.8 mg via SUBLINGUAL

## 2021-10-21 MED ORDER — METOPROLOL TARTRATE 5 MG/5ML IV SOLN
INTRAVENOUS | Status: AC
  Administered 2021-10-21: 5 mg via INTRAVENOUS
  Filled 2021-10-21: qty 10

## 2021-10-21 NOTE — Telephone Encounter (Signed)
Order ID: [id number]        Coronary (Cardiac) CTA     Non-Authorized  Criteria Not Met     Clinical Rationale:  Your doctor told us that you have chest pain. Your doctor is checking you for a condition causing decreased blood flow to the heart. This happens when blood vessels in the heart become narrowed or blocked. Your doctor ordered a special picture of the blood vessels in your heart. This test should be used if you are at moderate or high risk for heart disease. Age, gender and the character of the chest pain are used to calculate this risk. We reviewed the notes we have. The notes do not show that you are at moderate or high risk for heart disease. Based on the information we have, this test is not medically necessary. We used AIM Specialty Health Guideline titled Imaging of the Heart, Coronary CT Angiography (CCTA) and CT Derived Fractional Flow Reserve (FFR-CT) to make this decision. You may view this guideline at [email or web address].

## 2021-10-29 ENCOUNTER — Other Ambulatory Visit (HOSPITAL_COMMUNITY): Payer: Self-pay | Admitting: Psychiatry

## 2021-10-29 DIAGNOSIS — F330 Major depressive disorder, recurrent, mild: Secondary | ICD-10-CM

## 2021-10-29 DIAGNOSIS — F419 Anxiety disorder, unspecified: Secondary | ICD-10-CM

## 2021-10-30 ENCOUNTER — Other Ambulatory Visit (HOSPITAL_COMMUNITY): Payer: Self-pay | Admitting: *Deleted

## 2021-10-30 DIAGNOSIS — F330 Major depressive disorder, recurrent, mild: Secondary | ICD-10-CM

## 2021-10-30 DIAGNOSIS — F419 Anxiety disorder, unspecified: Secondary | ICD-10-CM

## 2021-10-30 MED ORDER — DULOXETINE HCL 60 MG PO CPEP: 60.0000 mg | ORAL_CAPSULE | Freq: Every day | ORAL | 0 refills | Status: DC

## 2021-11-02 ENCOUNTER — Other Ambulatory Visit: Payer: Self-pay

## 2021-11-02 ENCOUNTER — Encounter (HOSPITAL_COMMUNITY): Payer: Self-pay | Admitting: Psychiatry

## 2021-11-02 ENCOUNTER — Telehealth (HOSPITAL_BASED_OUTPATIENT_CLINIC_OR_DEPARTMENT_OTHER): Payer: BC Managed Care – PPO | Admitting: Psychiatry

## 2021-11-02 DIAGNOSIS — F419 Anxiety disorder, unspecified: Secondary | ICD-10-CM

## 2021-11-02 DIAGNOSIS — F330 Major depressive disorder, recurrent, mild: Secondary | ICD-10-CM | POA: Diagnosis not present

## 2021-11-02 MED ORDER — DULOXETINE HCL 60 MG PO CPEP: 60.0000 mg | ORAL_CAPSULE | Freq: Every day | ORAL | 1 refills | Status: DC

## 2021-11-02 NOTE — Progress Notes (Signed)
Virtual Visit via Telephone Note  I connected with Stephanie Gray on 11/02/21 at  2:40 PM EST by telephone and verified that I am speaking with the correct person using two identifiers.  Location: Patient: Home Provider: Home Office   I discussed the limitations, risks, security and privacy concerns of performing an evaluation and management service by telephone and the availability of in person appointments. I also discussed with the patient that there may be a patient responsible charge related to this service. The patient expressed understanding and agreed to proceed.   History of Present Illness: Patient is evaluated by phone session.  She is taking Cymbalta every day which is helping her depression and anxiety.  She did not get sleep study because her husband got covered but also noticed improvement in her sleep.  She is no longer taking beta-blocker and she believes her energy level is much better and she is not tired.  She is now taking amlodipine and valsartan.  She denies any crying spells or any feeling of hopelessness or worthlessness.  Patient reported kids are now out of the home and they have emptiness but excited as 2 kids are coming home for Thanksgiving.  Her parents live close by and her husband's family lives in [place].  Patient like to keep the Cymbalta since it is working well.  She denies any panic attack, anhedonia.  Her appetite is okay and recently she joined the weight loss program to lose weight.  She like to keep the current Cymbalta dose which is working well.  She has no tremors, shakes or any EPS.  Recently she had a fall from riding and took steroids and pain meds but now stopped.   Past Psychiatric History:  H/O depression and anxiety. Taking meds since her son  born.  Took Paxil, Zoloft and propanolol for anxiety.  Lamictal caused rash. No h/o inpatient or suicidal. H/O episodic hypomanic-like symptoms as more energy and poor sleep but able to function. No h/o  paranoia and hallucination.  Saw Letta Moynahan for 10 years.    Psychiatric Specialty Exam: Physical Exam  Review of Systems  Weight 148 lb (67.1 kg), last menstrual period 06/19/2020.There is no height or weight on file to calculate BMI.  General Appearance: NA  Eye Contact:  NA  Speech:  Clear and Coherent and Normal Rate  Volume:  Normal  Mood:  Euthymic  Affect:  NA  Thought Process:  Goal Directed  Orientation:  Full (Time, Place, and Person)  Thought Content:  Logical  Suicidal Thoughts:  No  Homicidal Thoughts:  No  Memory:  Immediate;   Good Recent;   Good Remote;   Good  Judgement:  Intact  Insight:  Present  Psychomotor Activity:  NA  Concentration:  Concentration: Good and Attention Span: Good  Recall:  Good  Fund of Knowledge:  Good  Language:  Good  Akathisia:  No  Handed:  Right  AIMS (if indicated):     Assets:  Communication Skills Desire for Improvement Housing Resilience Social Support Talents/Skills Transportation  ADL's:  Intact  Cognition:  WNL  Sleep:   ok      Assessment and Plan: MDD, Recurrent. Anxiety  Patient is stable on meds. Continue Cymbalta 60 mg daily. Reviewed medication, off from Beta Blocker and energy level better. Recommend to call back if any question or concerns otherwise follow up in 6 months.   Follow Up Instructions:    I discussed the assessment and treatment plan with  the patient. The patient was provided an opportunity to ask questions and all were answered. The patient agreed with the plan and demonstrated an understanding of the instructions.   The patient was advised to call back or seek an in-person evaluation if the symptoms worsen or if the condition fails to improve as anticipated.  I provided 17 minutes of non-face-to-face time during this encounter.   Kathlee Nations, MD

## 2021-11-20 ENCOUNTER — Ambulatory Visit (HOSPITAL_COMMUNITY)
Admission: RE | Admit: 2021-11-20 | Discharge: 2021-11-20 | Disposition: A | Discharge status: Home / Self Care | Payer: BC Managed Care – PPO | Source: Ambulatory Visit | Attending: Cardiology | Admitting: Cardiology

## 2021-11-20 ENCOUNTER — Other Ambulatory Visit: Payer: Self-pay

## 2021-11-20 ENCOUNTER — Ambulatory Visit (HOSPITAL_BASED_OUTPATIENT_CLINIC_OR_DEPARTMENT_OTHER)
Admission: RE | Admit: 2021-11-20 | Discharge: 2021-11-20 | Disposition: A | Discharge status: Home / Self Care | Payer: BC Managed Care – PPO | Source: Ambulatory Visit | Attending: Cardiology | Admitting: Cardiology

## 2021-11-20 VITALS — BP 121/70 | HR 89 | Wt 144.4 lb

## 2021-11-20 DIAGNOSIS — E039 Hypothyroidism, unspecified: Secondary | ICD-10-CM | POA: Insufficient documentation

## 2021-11-20 DIAGNOSIS — R0789 Other chest pain: Secondary | ICD-10-CM | POA: Diagnosis not present

## 2021-11-20 DIAGNOSIS — Z79899 Other long term (current) drug therapy: Secondary | ICD-10-CM | POA: Insufficient documentation

## 2021-11-20 DIAGNOSIS — R0683 Snoring: Secondary | ICD-10-CM | POA: Diagnosis not present

## 2021-11-20 DIAGNOSIS — R0609 Other forms of dyspnea: Secondary | ICD-10-CM | POA: Insufficient documentation

## 2021-11-20 DIAGNOSIS — I471 Supraventricular tachycardia: Secondary | ICD-10-CM | POA: Insufficient documentation

## 2021-11-20 DIAGNOSIS — R0602 Shortness of breath: Secondary | ICD-10-CM | POA: Insufficient documentation

## 2021-11-20 DIAGNOSIS — I10 Essential (primary) hypertension: Secondary | ICD-10-CM | POA: Diagnosis not present

## 2021-11-20 DIAGNOSIS — R400 Somnolence: Secondary | ICD-10-CM | POA: Diagnosis not present

## 2021-11-20 DIAGNOSIS — I493 Ventricular premature depolarization: Secondary | ICD-10-CM | POA: Diagnosis not present

## 2021-11-20 LAB — ECHOCARDIOGRAM COMPLETE
Area-P 1/2: 3.65 cm2
Calc EF: 61 %
S' Lateral: 2.3 cm
Single Plane A2C EF: 55.1 %
Single Plane A4C EF: 64.6 %

## 2021-11-20 NOTE — Patient Instructions (Signed)
No Labs done today.   No medication changes were made. Please continue all current medications as prescribed.  Your physician has recommended that you have a sleep study. This test records several body functions during sleep, including: brain activity, eye movement, oxygen and carbon dioxide blood levels, heart rate and rhythm, breathing rate and rhythm, the flow of air through your mouth and nose, snoring, body muscle movements, and chest and belly movement.  Your physician recommends that you schedule a follow-up appointment as needed.  If you have any questions or concerns before your next appointment please send [place] a message through [place] or call our office at [phone].    TO LEAVE A MESSAGE FOR THE NURSE SELECT OPTION 2, PLEASE LEAVE A MESSAGE INCLUDING: YOUR NAME DATE OF BIRTH CALL BACK NUMBER REASON FOR CALL**this is important as we prioritize the call backs  YOU WILL RECEIVE A CALL BACK THE SAME DAY AS LONG AS YOU CALL BEFORE 4:00 PM   Do the following things EVERYDAY: Weigh yourself in the morning before breakfast. Write it down and keep it in a log. Take your medicines as prescribed Eat low salt foods--Limit salt (sodium) to 2000 mg per day.  Stay as active as you can everyday Limit all fluids for the day to less than 2 liters   At the [facility], you and your health needs are our priority. As part of our continuing mission to provide you with exceptional heart care, we have created designated Provider Care Teams. These Care Teams include your primary Cardiologist (physician) and Advanced Practice Providers (APPs- Physician Assistants and Nurse Practitioners) who all work together to provide you with the care you need, when you need it.   You may see any of the following providers on your designated Care Team at your next follow up: Dr Glori Bickers Dr Haynes Kerns, NP Lyda Jester, [place] Audry Riles, PharmD   Please be sure to  bring in all your medications bottles to every appointment.

## 2021-11-22 NOTE — Progress Notes (Signed)
PCP: Marrian Salvage, [place] Card[facility]: Dr. Aundra Dubin  55 y.o. with history of HTN, PVCs/short atrial tachycardia, and hypothyroidism was self-referred for evaluation of exertional dyspnea.  Patient reports the gradual increase in dyspnea for about 2 years. She notes shortness of breath when she jumps her horse (involves a fair amount of exertion to control the horse). This has bothered her a lot.  She also notes dyspnea at the top of a flight of stairs.  No orthopnea/PND.  She has chest pain at "random times," not related to exertion.  She thinks this could be GERD.  She had palpitations in the past, was noted to have PVCs and short runs of atrial tachycardia.  She was on Toprol XL in the past but stopped it due to fatigue.  She has rare palpitations now.  She snores and has been noted to stop breathing in her sleep.  BP is mildly elevated today, has been elevated at other times as well.   Coronary CTA was done in 11/22.  This showed calcium score 0 and no significant CAD.  Echo was done today and reviewed, EF 60-65%, normal RV.   She returns for followup of dyspnea today.  She has been dieting and weight is trending down.  She feels better with weight loss, less short of breath in general.   Labs (3/22): LDL 113 Labs (8/22): K 4.3, creatinine 0.85 Labs (10/22): BNP 8, K 4.6, creatinine 0.75  PMH: 1. Depression 2. HTN 3. H/o PVCs 4. H/o short atrial tachycardia runs. 5. Hypothyroidism 6. H/o COVID-19 7. Cholecystectomy 7/21 8. Echo (12/20): EF 60-65%, mild asymmetric basal septal hypertrophy, normal RV.  - Echo (11/22): EF 60-65%, normal RV.  9. Coronary calcium score (12/20): 0 Agatston units.  Coronary CTA (11/22) with calcium score 0 and no significant CAD.   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  Nonsmoker, rare ETOH.   FH: Father with MI in his 87s, mother with bicuspid aortic valve and HTN.   ROS: All systems reviewed and negative except as per HPI.   Current Outpatient  Medications  Medication Sig Dispense Refill   acetaminophen (TYLENOL) 325 MG tablet Take 2 tablets (650 mg total) by mouth every 6 (six) hours as needed. 100 tablet 2   amLODipine (NORVASC) 5 MG tablet Take 1 tablet (5 mg total) by mouth daily. 90 tablet 3   DULoxetine (CYMBALTA) 60 MG capsule Take 1 capsule (60 mg total) by mouth daily. 90 capsule 1   Trolamine Salicylate (BLUE-EMU HEMP EX) Apply 1 tablet topically in the morning and at bedtime. Hemp muscadine     valsartan (DIOVAN) 160 MG tablet TAKE 1 TABLET BY MOUTH EVERY DAY 90 tablet 0   No current facility-administered medications for this encounter.   BP 121/70   Pulse 89   Wt 65.5 kg (144 lb 6.4 oz)   LMP 06/19/2020 (Approximate)   SpO2 98%   BMI 26.41 kg/m  General: NAD Neck: No JVD, no thyromegaly or thyroid nodule.  Lungs: Clear to auscultation bilaterally with normal respiratory effort. CV: Nondisplaced PMI.  Heart regular S1/S2, no S3/S4, no murmur.  No peripheral edema.  No carotid bruit.  Normal pedal pulses.  Abdomen: Soft, nontender, no hepatosplenomegaly, no distention.  Skin: Intact without lesions or rashes.  Neurologic: Alert and oriented x 3.  Psych: Normal affect. Extremities: No clubbing or cyanosis.  HEENT: Normal.   Assessment/Plan: 1. Exertional dyspnea/atypical chest pain:  This had been gradually progressive, now somewhat improved with  weight loss.  ECG is normal.  She is not volume overloaded on exam.  She had a normal coronary CTA and echo in 11/22.  BNP was normal. She does not appear to have significant cardiac disease.  Suspect deconditioning, continue weight loss and exercise.    2. HTN: BP is controlled.  - Continue amlodipine and valsartan.  3. Suspect OSA: I will arrange for home sleep study.   Followup prn  Loralie Champagne 11/22/2021

## 2021-11-27 ENCOUNTER — Telehealth (HOSPITAL_COMMUNITY): Payer: Self-pay | Admitting: Surgery

## 2021-11-27 NOTE — Telephone Encounter (Signed)
Stopbang assessment performed during patients clinic visit on 12/2.  Home sleep test ordered per Dr. Aundra Dubin.  Date:  11/27/2021 STOP BANG RISK ASSESSMENT S (snore) Have you been told that you snore?     YES   T (tired) Are you often tired, fatigued, or sleepy during the day?   YES  O (obstruction) Do you stop breathing, choke, or gasp during sleep? YES   P (pressure) Do you have or are you being treated for high blood pressure? YES   B (BMI) Is your body index greater than 35 kg/m? NO   A (age) Are you 57 years old or older? YES   N (neck) Do you have a neck circumference greater than 16 inches?   NO   G (gender) Are you a female? NO   TOTAL STOP/BANG "YES" ANSWERS 5                                                                       For Office Use Only              Procedure Order Form    YES to 3+ Stop Bang questions OR two clinical symptoms - patient qualifies for WatchPAT (CPT 95800)

## 2021-11-27 NOTE — Telephone Encounter (Signed)
I called patient at both listed numbers.  I left a message to request that she come by the clinic to pick up the ordered home sleep study device and call back with any concerns or questions.  She was recently seen in clinic and did not have time to wait to receive the device.

## 2021-12-02 ENCOUNTER — Telehealth (HOSPITAL_COMMUNITY): Payer: Self-pay | Admitting: *Deleted

## 2021-12-02 NOTE — Telephone Encounter (Signed)
° °  Home sleep study approved

## 2021-12-04 ENCOUNTER — Telehealth (HOSPITAL_COMMUNITY): Payer: Self-pay | Admitting: Surgery

## 2021-12-04 NOTE — Telephone Encounter (Signed)
Patient called and informed that she could proceed with ordered home sleep study- Insurance authorized.  She is scheduled for nurse visit to come to [facility] to pick up device and receive instructions for use on 12/08/21 at 2pm

## 2021-12-07 ENCOUNTER — Other Ambulatory Visit: Payer: Self-pay | Admitting: Physician Assistant

## 2021-12-08 ENCOUNTER — Encounter (HOSPITAL_COMMUNITY): Payer: BC Managed Care – PPO

## 2021-12-09 ENCOUNTER — Telehealth (HOSPITAL_COMMUNITY): Payer: Self-pay | Admitting: Surgery

## 2021-12-09 NOTE — Telephone Encounter (Signed)
I called and spoke with patient about missed appt yesterday to pick up home sleep study.  She tells me that she forgot and can come tomorrow.  I have rescheduled a nurse appt for 2 pm Dec 22 and indicated in notes that she needs home sleep study along with instructions for use.

## 2021-12-10 ENCOUNTER — Telehealth (HOSPITAL_COMMUNITY): Payer: Self-pay | Admitting: Surgery

## 2021-12-10 ENCOUNTER — Encounter (HOSPITAL_COMMUNITY): Payer: BC Managed Care – PPO

## 2021-12-10 NOTE — Telephone Encounter (Signed)
Patient came to [facility] to pick up home sleep study device.  She did not remain for her nurse appt to review instructions.  I called her and gave brief instructions and asked that she call back with concerns or questions.  I requested that she complete the study within 7 days.

## 2021-12-20 DIAGNOSIS — J939 Pneumothorax, unspecified: Secondary | ICD-10-CM

## 2021-12-20 HISTORY — DX: Pneumothorax, unspecified: J93.9

## 2021-12-21 ENCOUNTER — Telehealth: Payer: BC Managed Care – PPO | Admitting: Emergency Medicine

## 2021-12-21 DIAGNOSIS — R062 Wheezing: Secondary | ICD-10-CM | POA: Diagnosis not present

## 2021-12-21 DIAGNOSIS — J069 Acute upper respiratory infection, unspecified: Secondary | ICD-10-CM | POA: Diagnosis not present

## 2021-12-21 MED ORDER — SPACER/AERO-HOLDING CHAMBERS DEVI: 1.0000 | 0 refills | Status: DC | PRN

## 2021-12-21 MED ORDER — ALBUTEROL SULFATE HFA 108 (90 BASE) MCG/ACT IN AERS: 2.0000 | INHALATION_SPRAY | Freq: Four times a day (QID) | RESPIRATORY_TRACT | 0 refills | Status: DC | PRN

## 2021-12-21 NOTE — Patient Instructions (Signed)
°  Stephanie Gray, thank you for joining Carvel Getting, NP for today's virtual visit.  While this provider is not your primary care provider (PCP), if your PCP is located in our provider database this encounter information will be shared with them immediately following your visit.  Consent: (Patient) Stephanie Gray provided verbal consent for this virtual visit at the beginning of the encounter.  Current Medications:  Current Outpatient Medications:    albuterol (VENTOLIN HFA) 108 (90 Base) MCG/ACT inhaler, Inhale 2 puffs into the lungs every 6 (six) hours as needed for wheezing or shortness of breath., Disp: 8 g, Rfl: 0   Spacer/Aero-Holding Chambers DEVI, 1 each by Does not apply route as needed., Disp: 1 each, Rfl: 0   acetaminophen (TYLENOL) 325 MG tablet, Take 2 tablets (650 mg total) by mouth every 6 (six) hours as needed., Disp: 100 tablet, Rfl: 2   amLODipine (NORVASC) 5 MG tablet, Take 1 tablet (5 mg total) by mouth daily., Disp: 90 tablet, Rfl: 3   DULoxetine (CYMBALTA) 60 MG capsule, Take 1 capsule (60 mg total) by mouth daily., Disp: 90 capsule, Rfl: 1   Trolamine Salicylate (BLUE-EMU HEMP EX), Apply 1 tablet topically in the morning and at bedtime. Hemp muscadine, Disp: , Rfl:    valsartan (DIOVAN) 160 MG tablet, TAKE 1 TABLET BY MOUTH EVERY DAY, Disp: 90 tablet, Rfl: 0   Medications ordered in this encounter:  Meds ordered this encounter  Medications   albuterol (VENTOLIN HFA) 108 (90 Base) MCG/ACT inhaler    Sig: Inhale 2 puffs into the lungs every 6 (six) hours as needed for wheezing or shortness of breath.    Dispense:  8 g    Refill:  0   Spacer/Aero-Holding Chambers DEVI    Sig: 1 each by Does not apply route as needed.    Dispense:  1 each    Refill:  0     *If you need refills on other medications prior to your next appointment, please contact your pharmacy*  Follow-Up: Call back or seek an in-person evaluation if the symptoms worsen or if the  condition fails to improve as anticipated.  Other Instructions Continue using Mucinex and Tylenol or ibuprofen for your symptoms.  I do suggest you test yourself for COVID at home as your symptoms do sound similar to COVID symptoms.  If your cough becomes bothersome, you can try Delsym, an over-the-counter cough medicine.  Use the albuterol inhaler with a spacer 2 puffs every 4-6 hours as needed for wheezing or shortness of breath.  If the inhaler is not helping your breathing and you feel like it is getting to where you are short of breath and having trouble breathing, please seek care in the emergency room.   If you have been instructed to have an in-person evaluation today at a local Urgent Care facility, please use the link below. It will take you to a list of all of our available [facility], including address, phone number and hours of operation. Please do not delay care.  [facility]  If you or a family member do not have a primary care provider, use the link below to schedule a visit and establish care. When you choose a [facility] primary care physician or advanced practice provider, you gain a long-term partner in health. Find a Primary Care Provider  Learn more about [facility]'s in-office and virtual care options: [facility]

## 2021-12-21 NOTE — Progress Notes (Signed)
Virtual Visit Consent   Stephanie Gray, you are scheduled for a virtual visit with a [facility] provider today.     Just as with appointments in the office, your consent must be obtained to participate.  Your consent will be active for this visit and any virtual visit you may have with one of our providers in the next 365 days.     If you have a MyChart account, a copy of this consent can be sent to you electronically.  All virtual visits are billed to your insurance company just like a traditional visit in the office.    As this is a virtual visit, video technology does not allow for your provider to perform a traditional examination.  This may limit your provider's ability to fully assess your condition.  If your provider identifies any concerns that need to be evaluated in person or the need to arrange testing (such as labs, EKG, etc.), we will make arrangements to do so.     Although advances in technology are sophisticated, we cannot ensure that it will always work on either your end or our end.  If the connection with a video visit is poor, the visit may have to be switched to a telephone visit.  With either a video or telephone visit, we are not always able to ensure that we have a secure connection.     I need to obtain your verbal consent now.   Are you willing to proceed with your visit today?    Stephanie Gray has provided verbal consent on 12/21/2021 for a virtual visit (video or telephone).   Carvel Getting, NP   Date: 12/21/2021 12:43 PM   Virtual Visit via Video Note   I, Carvel Getting, connected with  Stephanie Gray  ([id number], [birth year]) on 12/21/21 at 12:30 PM EST by a telemedicine application and verified that I am speaking with the correct person using two identifiers.  Stephanie Gray was unable to locate the link for the video visit and we conducted this visit by telephone.  Location: Patient: Virtual Visit Location Patient: Home Provider: Virtual  Visit Location Provider: Home Office   I discussed the limitations of evaluation and management by telemedicine and the availability of in person appointments. The patient expressed understanding and agreed to proceed.    History of Present Illness: Stephanie Gray is a 56 y.o. who identifies as a female who was assigned female at birth, and is being seen today for cough.  She has had this cough for 2 days.  She feels like it is a deep cough and she is having wheezing.  She also was experiencing body aches, chills.  She also reports headache and fatigue.  She has a mild sore throat that she thinks is related to coughing.  She also has mild postnasal drainage.  She does not have significant nasal congestion.  She has been using Mucinex with Tylenol or ibuprofen plus turmeric and zinc for her symptoms.  She has not tested herself for COVID.  She did not get a flu shot this year and she is not up-to-date on COVID vaccines as she has not gotten her by [facility] COVID-vaccine yet.  She is not short of breath.  She has needed to use an albuterol inhaler in the past with other illnesses and think she needs one now.  She has been seeing heart failure cardiologist recently but reports that her testing results show she has no cardiovascular disease.  Review of records indicates she had a normal echo in early December and normal coronary CT that did not show CAD in early November of this year.  Patient denies lower extremity edema or weight gain.  HPI: HPI  Problems:  Patient Active Problem List   Diagnosis Date Noted   PVC's (premature ventricular contractions)    Atrial tachycardia, paroxysmal (HCC)    Family history of premature CAD 11/21/2019   Family history of bicuspid aortic valve 11/21/2019   Hypothyroidism 03/17/2018   Menorrhagia 03/17/2018   Perimenopausal disorder 03/17/2018   Lightheadedness 03/17/2018   Neuromuscular disorder ([place])    Depression    Arthritis    Anxiety    Apocrine  metaplasia of breast, right 03/03/2018   Breast mass, right 04/12/2017   Hypertension 06/28/2016   Routine general medical examination at a health care facility 04/06/2016   Elevated blood pressure reading without diagnosis of hypertension 06/12/2015   Tubular adenoma of colon 04/27/2015   Nonspecific abnormal results of thyroid function study 04/14/2009   DEPRESSION 04/14/2009   NONSPECIFIC ABNORMAL ELECTROCARDIOGRAM 04/14/2009    Allergies:  Allergies  Allergen Reactions   Benzodiazepines Other (See Comments)    Patient needed crash cart after tubal ligation   Other Other (See Comments)    Patient is extremely sensitive to all medicines, maybe needs 1/4 or 1/8 dosage of normal adult   Augmentin [Amoxicillin-Pot Clavulanate] Other (See Comments)   Lamictal [Lamotrigine] Rash   Penicillins Rash    Rash because of a history of documented adverse serious drug reaction;Medi Alert bracelet  is recommended (CHILDHOOD REACTION) Has patient had a PCN reaction causing immediate rash, facial/tongue/throat swelling, SOB or lightheadedness with hypotension:UNKNOWN Has patient had a PCN reaction causing severe rash involving mucus membranes or skin necrosis:unsure Has patient had a PCN reaction that required hospitalization:unsure Has patient had a PCN reaction occurring within the last 10 years:unsure    Medications:  Current Outpatient Medications:    albuterol (VENTOLIN HFA) 108 (90 Base) MCG/ACT inhaler, Inhale 2 puffs into the lungs every 6 (six) hours as needed for wheezing or shortness of breath., Disp: 8 g, Rfl: 0   Spacer/Aero-Holding Chambers DEVI, 1 each by Does not apply route as needed., Disp: 1 each, Rfl: 0   acetaminophen (TYLENOL) 325 MG tablet, Take 2 tablets (650 mg total) by mouth every 6 (six) hours as needed., Disp: 100 tablet, Rfl: 2   amLODipine (NORVASC) 5 MG tablet, Take 1 tablet (5 mg total) by mouth daily., Disp: 90 tablet, Rfl: 3   DULoxetine (CYMBALTA) 60 MG capsule,  Take 1 capsule (60 mg total) by mouth daily., Disp: 90 capsule, Rfl: 1   Trolamine Salicylate (BLUE-EMU HEMP EX), Apply 1 tablet topically in the morning and at bedtime. Hemp muscadine, Disp: , Rfl:    valsartan (DIOVAN) 160 MG tablet, TAKE 1 TABLET BY MOUTH EVERY DAY, Disp: 90 tablet, Rfl: 0  Observations/Objective: Patient is  in no acute distress.  Resting comfortably  at home.  No labored breathing.  Coughing spasms heard via telephone. Speech is clear and coherent with logical content.  Patient is alert and oriented at baseline.    Assessment and Plan: 1. Viral upper respiratory tract infection  2. Wheezing  Patient to continue home supportive care measures including Mucinex.  I recommended Delsym if she feels she needs a cough medicine.  I prescribed albuterol with a spacer for wheezing and cough.  Reviewed reasons for seeking a higher level of care.  Follow Up  Instructions: I discussed the assessment and treatment plan with the patient. The patient was provided an opportunity to ask questions and all were answered. The patient agreed with the plan and demonstrated an understanding of the instructions.  A copy of instructions were sent to the patient via MyChart unless otherwise noted below.   The patient was advised to call back or seek an in-person evaluation if the symptoms worsen or if the condition fails to improve as anticipated.  Time:  I spent 10 minutes with the patient via telehealth technology discussing the above problems/concerns.    Carvel Getting, NP

## 2021-12-23 ENCOUNTER — Telehealth: Payer: BC Managed Care – PPO | Admitting: Nurse Practitioner

## 2021-12-23 DIAGNOSIS — J40 Bronchitis, not specified as acute or chronic: Secondary | ICD-10-CM

## 2021-12-23 MED ORDER — BENZONATATE 100 MG PO CAPS: 100.0000 mg | ORAL_CAPSULE | Freq: Three times a day (TID) | ORAL | 0 refills | Status: DC | PRN

## 2021-12-23 MED ORDER — PREDNISONE 20 MG PO TABS: 40.0000 mg | ORAL_TABLET | Freq: Every day | ORAL | 0 refills | Status: DC

## 2021-12-23 MED ORDER — AZITHROMYCIN 250 MG PO TABS: ORAL_TABLET | ORAL | 0 refills | Status: DC

## 2021-12-23 NOTE — Patient Instructions (Signed)

## 2021-12-23 NOTE — Progress Notes (Signed)
Virtual Visit Consent   Stephanie Gray, you are scheduled for a virtual visit with Stephanie Gray, [place], a [facility] provider, today.     Just as with appointments in the office, your consent must be obtained to participate.  Your consent will be active for this visit and any virtual visit you may have with one of our providers in the next 365 days.     If you have a MyChart account, a copy of this consent can be sent to you electronically.  All virtual visits are billed to your insurance company just like a traditional visit in the office.    As this is a virtual visit, video technology does not allow for your provider to perform a traditional examination.  This may limit your provider's ability to fully assess your condition.  If your provider identifies any concerns that need to be evaluated in person or the need to arrange testing (such as labs, EKG, etc.), we will make arrangements to do so.     Although advances in technology are sophisticated, we cannot ensure that it will always work on either your end or our end.  If the connection with a video visit is poor, the visit may have to be switched to a telephone visit.  With either a video or telephone visit, we are not always able to ensure that we have a secure connection.     I need to obtain your verbal consent now.   Are you willing to proceed with your visit today? YES   Stephanie Gray has provided verbal consent on 12/23/2021 for a virtual visit (video or telephone).   Stephanie Hassell Done, FNP   Date: 12/23/2021 10:08 AM   Virtual Visit via Video Note   I, Stephanie Gray, connected with Stephanie Gray ([id number], May 04, 1966) on 12/23/21 at 10:00 AM EST by a video-enabled telemedicine application and verified that I am speaking with the correct person using two identifiers.  Location: Patient: Virtual Visit Location Patient: Home Provider: Virtual Visit Location Provider: Mobile   I discussed  the limitations of evaluation and management by telemedicine and the availability of in person appointments. The patient expressed understanding and agreed to proceed.    History of Present Illness: Stephanie Gray is a 56 y.o. who identifies as a female who was assigned female at birth, and is being seen today for cough.  HPI: Cough This is a new problem. The current episode started in the past 7 days. The problem has been gradually worsening. The problem occurs constantly. The cough is Productive of sputum. Associated symptoms include chest pain, chills, a fever, headaches, myalgias, rhinorrhea and shortness of breath. Sore throat: from coughing.The symptoms are aggravated by exercise and lying down. She has tried OTC cough suppressant for the symptoms. The treatment provided mild relief.  Covid test negative Review of Systems  Constitutional:  Positive for chills and fever.  HENT:  Positive for rhinorrhea. Sore throat: from coughing.  Respiratory:  Positive for cough and shortness of breath.   Cardiovascular:  Positive for chest pain.  Musculoskeletal:  Positive for myalgias.  Neurological:  Positive for headaches.   Problems:  Patient Active Problem List   Diagnosis Date Noted   PVC's (premature ventricular contractions)    Atrial tachycardia, paroxysmal (HCC)    Family history of premature CAD 11/21/2019   Family history of bicuspid aortic valve 11/21/2019   Hypothyroidism 03/17/2018   Menorrhagia 03/17/2018   Perimenopausal disorder 03/17/2018   Lightheadedness  03/17/2018   Neuromuscular disorder ([place])    Depression    Arthritis    Anxiety    Apocrine metaplasia of breast, right 03/03/2018   Breast mass, right 04/12/2017   Hypertension 06/28/2016   Routine general medical examination at a health care facility 04/06/2016   Elevated blood pressure reading without diagnosis of hypertension 06/12/2015   Tubular adenoma of colon 04/27/2015   Nonspecific abnormal results of  thyroid function study 04/14/2009   DEPRESSION 04/14/2009   NONSPECIFIC ABNORMAL ELECTROCARDIOGRAM 04/14/2009    Allergies:  Allergies  Allergen Reactions   Benzodiazepines Other (See Comments)    Patient needed crash cart after tubal ligation   Other Other (See Comments)    Patient is extremely sensitive to all medicines, maybe needs 1/4 or 1/8 dosage of normal adult   Augmentin [Amoxicillin-Pot Clavulanate] Other (See Comments)   Lamictal [Lamotrigine] Rash   Penicillins Rash    Rash because of a history of documented adverse serious drug reaction;Medi Alert bracelet  is recommended (CHILDHOOD REACTION) Has patient had a PCN reaction causing immediate rash, facial/tongue/throat swelling, SOB or lightheadedness with hypotension:UNKNOWN Has patient had a PCN reaction causing severe rash involving mucus membranes or skin necrosis:unsure Has patient had a PCN reaction that required hospitalization:unsure Has patient had a PCN reaction occurring within the last 10 years:unsure    Medications:  Current Outpatient Medications:    acetaminophen (TYLENOL) 325 MG tablet, Take 2 tablets (650 mg total) by mouth every 6 (six) hours as needed., Disp: 100 tablet, Rfl: 2   albuterol (VENTOLIN HFA) 108 (90 Base) MCG/ACT inhaler, Inhale 2 puffs into the lungs every 6 (six) hours as needed for wheezing or shortness of breath., Disp: 8 g, Rfl: 0   amLODipine (NORVASC) 5 MG tablet, Take 1 tablet (5 mg total) by mouth daily., Disp: 90 tablet, Rfl: 3   DULoxetine (CYMBALTA) 60 MG capsule, Take 1 capsule (60 mg total) by mouth daily., Disp: 90 capsule, Rfl: 1   Spacer/Aero-Holding Chambers DEVI, 1 each by Does not apply route as needed., Disp: 1 each, Rfl: 0   Trolamine Salicylate (BLUE-EMU HEMP EX), Apply 1 tablet topically in the morning and at bedtime. Hemp muscadine, Disp: , Rfl:    valsartan (DIOVAN) 160 MG tablet, TAKE 1 TABLET BY MOUTH EVERY DAY, Disp: 90 tablet, Rfl:  0  Observations/Objective: Patient is well-developed, well-nourished in no acute distress.  Resting comfortably  at home.  Head is normocephalic, atraumatic.  No labored breathing.  Speech is clear and coherent with logical content.  Patient is alert and oriented at baseline.  hoarse voice Cough during visit  Assessment and Plan:  Stephanie Balzarine I Halfmann in today with chief complaint of Cough   1. Bronchitis 1. Take meds as prescribed 2. Use a cool mist humidifier especially during the winter months and when heat has been humid. 3. Use saline nose sprays frequently 4. Saline irrigations of the nose can be very helpful if Gray frequently.  * 4X daily for 1 week*  * Use of a nettie pot can be helpful with this. Follow directions with this* 5. Drink plenty of fluids 6. Keep thermostat turn down low 7.For any cough or congestion- tessalon perles 8. For fever or aces or pains- take tylenol or ibuprofen appropriate for age and weight.  * for fevers greater than 101 orally you may alternate ibuprofen and tylenol every  3 hours.   Meds ordered this encounter  Medications   azithromycin (ZITHROMAX Z-PAK) 250 MG tablet  Sig: As directed    Dispense:  6 tablet    Refill:  0    Order Specific Question:   Supervising Provider    Answer:   MILLER, BRIAN [[id number]]   benzonatate (TESSALON PERLES) 100 MG capsule    Sig: Take 1 capsule (100 mg total) by mouth 3 (three) times daily as needed for cough.    Dispense:  20 capsule    Refill:  0    Order Specific Question:   Supervising Provider    Answer:   MILLER, BRIAN [[id number]]   predniSONE (DELTASONE) 20 MG tablet    Sig: Take 2 tablets (40 mg total) by mouth daily with breakfast for 5 days. 2 po daily for 5 days    Dispense:  10 tablet    Refill:  0    Order Specific Question:   Supervising Provider    Answer:   Noemi Chapel [[id number]]      Follow Up Instructions: I discussed the assessment and treatment plan with the patient. The patient  was provided an opportunity to ask questions and all were answered. The patient agreed with the plan and demonstrated an understanding of the instructions.  A copy of instructions were sent to the patient via MyChart.  The patient was advised to call back or seek an in-person evaluation if the symptoms worsen or if the condition fails to improve as anticipated.  Time:  I spent 10 minutes with the patient via telehealth technology discussing the above problems/concerns.    Stephanie Hassell Done, FNP

## 2021-12-25 ENCOUNTER — Ambulatory Visit
Admission: EM | Admit: 2021-12-25 | Discharge: 2021-12-25 | Disposition: A | Discharge status: Home / Self Care | Payer: BC Managed Care – PPO | Attending: Family Medicine | Admitting: Family Medicine

## 2021-12-25 ENCOUNTER — Telehealth (HOSPITAL_COMMUNITY): Payer: Self-pay | Admitting: Surgery

## 2021-12-25 ENCOUNTER — Encounter: Payer: Self-pay | Admitting: Emergency Medicine

## 2021-12-25 ENCOUNTER — Telehealth: Payer: BC Managed Care – PPO | Admitting: Emergency Medicine

## 2021-12-25 DIAGNOSIS — Z20822 Contact with and (suspected) exposure to covid-19: Secondary | ICD-10-CM

## 2021-12-25 DIAGNOSIS — R062 Wheezing: Secondary | ICD-10-CM

## 2021-12-25 DIAGNOSIS — J22 Unspecified acute lower respiratory infection: Secondary | ICD-10-CM | POA: Diagnosis not present

## 2021-12-25 DIAGNOSIS — B9789 Other viral agents as the cause of diseases classified elsewhere: Secondary | ICD-10-CM

## 2021-12-25 LAB — POCT INFLUENZA A/B
Influenza A, POC: NEGATIVE
Influenza B, POC: NEGATIVE

## 2021-12-25 MED ORDER — PROMETHAZINE-DM 6.25-15 MG/5ML PO SYRP: 5.0000 mL | ORAL_SOLUTION | Freq: Four times a day (QID) | ORAL | 0 refills | Status: DC | PRN

## 2021-12-25 MED ORDER — ALBUTEROL SULFATE (2.5 MG/3ML) 0.083% IN NEBU
2.5000 mg | INHALATION_SOLUTION | RESPIRATORY_TRACT | Status: AC
  Administered 2021-12-25: 2.5 mg via RESPIRATORY_TRACT

## 2021-12-25 MED ORDER — PREDNISONE 10 MG PO TABS: ORAL_TABLET | ORAL | 0 refills | Status: AC

## 2021-12-25 NOTE — Patient Instructions (Signed)
Based on what you shared with me, I feel your condition warrants further evaluation and I recommend that you be seen in a face to face visit.   NOTE: There will be NO CHARGE for this Visit   If you are having a true medical emergency please call 911.      For an urgent face to face visit, [facility] has six urgent care centers for your convenience:     [facility] [place] Get Driving Directions [phone] [place], [place] [address]    [facility]) Get Driving Directions [phone] [place], [place] [address]  [facility] [place] [facility]) Get Driving Directions [phone] [address] [place],  [place]  [address]  [facility] Get Driving Directions [phone] [place], [place], [place] [address]   [facility] Get Driving Directions  [phone] [address] [place], [place] [address]   [facility] [place] Get Driving Directions [phone] [address] [place], [place] [address]

## 2021-12-25 NOTE — Discharge Instructions (Signed)
If your symptom worsen or do not readily improve with completion of treatment, return here for evaluation or if breathing problems become severe go immediately to the Emergency Department.

## 2021-12-25 NOTE — Telephone Encounter (Signed)
Patient called and reminded to perform ordered home sleep study.  She tells me that she has a "cold and is wheezing".  She also says that she will complete when she feels better.  I encouraged her to complete as soon as she is able to.

## 2021-12-25 NOTE — ED Triage Notes (Signed)
Pt here with cough and chest congestion x 6 days. Was seen virtually and has been on abx for 2 days with no relief. Concerned for pneumonia.

## 2021-12-25 NOTE — Progress Notes (Signed)
Virtual Visit Consent   [name], you are scheduled for a virtual visit with a [facility] provider today.     Just as with appointments in the office, your consent must be obtained to participate.  Your consent will be active for this visit and any virtual visit you may have with one of our providers in the next 365 days.     If you have a MyChart account, a copy of this consent can be sent to you electronically.  All virtual visits are billed to your insurance company just like a traditional visit in the office.    As this is a virtual visit, video technology does not allow for your provider to perform a traditional examination.  This may limit your provider's ability to fully assess your condition.  If your provider identifies any concerns that need to be evaluated in person or the need to arrange testing (such as labs, EKG, etc.), we will make arrangements to do so.     Although advances in technology are sophisticated, we cannot ensure that it will always work on either your end or our end.  If the connection with a video visit is poor, the visit may have to be switched to a telephone visit.  With either a video or telephone visit, we are not always able to ensure that we have a secure connection.     I need to obtain your verbal consent now.   Are you willing to proceed with your visit today?    [name] has provided verbal consent on 12/25/2021 for a virtual visit video.   Montine Circle, PA-C   Date: 12/25/2021 10:58 AM   Virtual Visit via Video Note   I, Montine Circle, connected with  Stephanie Gray  ([id number], [birth year]) on 12/25/21 at 11:00 AM EST by a video-enabled telemedicine application and verified that I am speaking with the correct person using two identifiers.  Location: Patient: Virtual Visit Location Patient: Home Provider: Virtual Visit Location Provider: Home Office   I discussed the limitations of evaluation and management by  telemedicine and the availability of in person appointments. The patient expressed understanding and agreed to proceed.    History of Present Illness: Stephanie Gray is a 56 y.o. who identifies as a female who was assigned female at birth, and is being seen today for coughing and wheezing and SOB.  No improvement with Z-pak, prednisone, and inhaler.  Has been worsening.Marland Kitchen  HPI: HPI  Problems:  Patient Active Problem List   Diagnosis Date Noted   PVC's (premature ventricular contractions)    Atrial tachycardia, paroxysmal (HCC)    Family history of premature CAD 11/21/2019   Family history of bicuspid aortic valve 11/21/2019   Hypothyroidism 03/17/2018   Menorrhagia 03/17/2018   Perimenopausal disorder 03/17/2018   Lightheadedness 03/17/2018   Neuromuscular disorder ([place])    Depression    Arthritis    Anxiety    Apocrine metaplasia of breast, right 03/03/2018   Breast mass, right 04/12/2017   Hypertension 06/28/2016   Routine general medical examination at a health care facility 04/06/2016   Elevated blood pressure reading without diagnosis of hypertension 06/12/2015   Tubular adenoma of colon 04/27/2015   Nonspecific abnormal results of thyroid function study 04/14/2009   DEPRESSION 04/14/2009   NONSPECIFIC ABNORMAL ELECTROCARDIOGRAM 04/14/2009    Allergies:  Allergies  Allergen Reactions   Benzodiazepines Other (See Comments)    Patient needed crash cart after tubal ligation  Other Other (See Comments)    Patient is extremely sensitive to all medicines, maybe needs 1/4 or 1/8 dosage of normal adult   Augmentin [Amoxicillin-Pot Clavulanate] Other (See Comments)   Lamictal [Lamotrigine] Rash   Penicillins Rash    Rash because of a history of documented adverse serious drug reaction;Medi Alert bracelet  is recommended (CHILDHOOD REACTION) Has patient had a PCN reaction causing immediate rash, facial/tongue/throat swelling, SOB or lightheadedness with  hypotension:UNKNOWN Has patient had a PCN reaction causing severe rash involving mucus membranes or skin necrosis:unsure Has patient had a PCN reaction that required hospitalization:unsure Has patient had a PCN reaction occurring within the last 10 years:unsure    Medications:  Current Outpatient Medications:    acetaminophen (TYLENOL) 325 MG tablet, Take 2 tablets (650 mg total) by mouth every 6 (six) hours as needed., Disp: 100 tablet, Rfl: 2   albuterol (VENTOLIN HFA) 108 (90 Base) MCG/ACT inhaler, Inhale 2 puffs into the lungs every 6 (six) hours as needed for wheezing or shortness of breath., Disp: 8 g, Rfl: 0   amLODipine (NORVASC) 5 MG tablet, Take 1 tablet (5 mg total) by mouth daily., Disp: 90 tablet, Rfl: 3   azithromycin (ZITHROMAX Z-PAK) 250 MG tablet, As directed, Disp: 6 tablet, Rfl: 0   benzonatate (TESSALON PERLES) 100 MG capsule, Take 1 capsule (100 mg total) by mouth 3 (three) times daily as needed for cough., Disp: 20 capsule, Rfl: 0   DULoxetine (CYMBALTA) 60 MG capsule, Take 1 capsule (60 mg total) by mouth daily., Disp: 90 capsule, Rfl: 1   predniSONE (DELTASONE) 20 MG tablet, Take 2 tablets (40 mg total) by mouth daily with breakfast for 5 days. 2 po daily for 5 days, Disp: 10 tablet, Rfl: 0   Spacer/Aero-Holding Chambers DEVI, 1 each by Does not apply route as needed., Disp: 1 each, Rfl: 0   Trolamine Salicylate (BLUE-EMU HEMP EX), Apply 1 tablet topically in the morning and at bedtime. Hemp muscadine, Disp: , Rfl:    valsartan (DIOVAN) 160 MG tablet, TAKE 1 TABLET BY MOUTH EVERY DAY, Disp: 90 tablet, Rfl: 0  Observations/Objective: Patient is well-developed, well-nourished in no acute distress.  Resting comfortably  at home.  Head is normocephalic, atraumatic.  No labored breathing, cough, and wheezing, but not in distress Speech is clear and coherent with logical content.  Patient is alert and oriented at baseline.    Assessment and Plan: 1. Wheezing  Redirect  to UC.  Follow Up Instructions: I discussed the assessment and treatment plan with the patient. The patient was provided an opportunity to ask questions and all were answered. The patient agreed with the plan and demonstrated an understanding of the instructions.  A copy of instructions were sent to the patient via MyChart unless otherwise noted below.     The patient was advised to call back or seek an in-person evaluation if the symptoms worsen or if the condition fails to improve as anticipated.  Time:  I spent 11 minutes with the patient via telehealth technology discussing the above problems/concerns.    Montine Circle, PA-C

## 2021-12-25 NOTE — ED Provider Notes (Signed)
Roderic Palau    CSN: [id number] Arrival date & time: 12/25/21  1238      History   Chief Complaint Chief Complaint  Patient presents with   Cough   Chest Congestion    HPI Stephanie Gray is a 56 y.o. female.   HPI Patient presents today with a 1 week history of coughing and wheezing.  Patient has had multiple video visits and is currently being treated with azithromycin, prednisone and anticough medication.  She has not had any viral testing.  She has no history of asthma. She endorses shortness of breath and significant wheezing which is worse at night. She has used the prescribed albuterol inhaler but feels the medication is not getting to her lungs. She was not provided with chamber. Cough is non productive. No fever at present. Fatigue related to poor sleep and from coughing during the night. No known direct exposure to anyone positive for COVID or influenza. Past Medical History:  Diagnosis Date   Anxiety    Apocrine metaplasia of breast, right 03/03/2018   Arthritis    Atrial tachycardia, paroxysmal ([place])    noted on event monitor 02/2020   Breast mass, right 04/12/2017   Cervical disc disease    Complication of anesthesia    very sensitive to all mdications,needs lower dosage   Depression    Hypertension    Hypothyroidism 03/17/2018   Neuromuscular disorder (HCC)    numbness in 2 fingers of left hand   Perimenopausal disorder 03/17/2018   Premature atrial contractions    PVC's (premature ventricular contractions)    noted on heart monitor 02/2020   Routine general medical examination at a health care facility 04/06/2016   Tubular adenoma of colon 04/27/2015   04/07/2015 @ 70 cm ,polyp; Dr Collene Mares Due 2019     Patient Active Problem List   Diagnosis Date Noted   PVC's (premature ventricular contractions)    Atrial tachycardia, paroxysmal ([place])    Family history of premature CAD 11/21/2019   Family history of bicuspid aortic valve 11/21/2019   Hypothyroidism  03/17/2018   Menorrhagia 03/17/2018   Perimenopausal disorder 03/17/2018   Lightheadedness 03/17/2018   Neuromuscular disorder ([place])    Depression    Arthritis    Anxiety    Apocrine metaplasia of breast, right 03/03/2018   Breast mass, right 04/12/2017   Hypertension 06/28/2016   Routine general medical examination at a health care facility 04/06/2016   Elevated blood pressure reading without diagnosis of hypertension 06/12/2015   Tubular adenoma of colon 04/27/2015   Nonspecific abnormal results of thyroid function study 04/14/2009   DEPRESSION 04/14/2009   NONSPECIFIC ABNORMAL ELECTROCARDIOGRAM 04/14/2009    Past Surgical History:  Procedure Laterality Date   APPENDECTOMY     BLADDER REPAIR     BREAST LUMPECTOMY WITH RADIOACTIVE SEED LOCALIZATION Right 02/10/2017   Procedure: RIGHT BREAST LUMPECTOMY WITH RADIOACTIVE SEED LOCALIZATION;  Surgeon: Autumn Messing III, MD;  Location: [facility];  Service: General;  Laterality: Right;   CESAREAN SECTION     X 2, Dr Reino Kent   CHOLECYSTECTOMY N/A 07/11/2020   Procedure: LAPAROSCOPIC CHOLECYSTECTOMY;  Surgeon: Donnie Mesa, MD;  Location: [facility];  Service: General;  Laterality: N/A;   EYE SURGERY Bilateral    Lasik    TUBAL LIGATION     bladder laceration repair    OB History     Gravida  2   Para  2   Term  2   Preterm  0  AB  0   Living  2      SAB  0   IAB  0   Ectopic  0   Multiple      Live Births  2            Home Medications    Prior to Admission medications   Medication Sig Start Date End Date Taking? Authorizing Provider  predniSONE (DELTASONE) 10 MG tablet Take 2 tablets (20 mg total) by mouth daily with breakfast for 1 day, THEN 1 tablet (10 mg total) daily with breakfast for 1 day. 12/28/21 12/30/21 Yes Scot Jun, FNP  promethazine-dextromethorphan (PROMETHAZINE-DM) 6.25-15 MG/5ML syrup Take 5 mLs by mouth 4 (four) times daily as needed for cough. 12/25/21  Yes Scot Jun, FNP   acetaminophen (TYLENOL) 325 MG tablet Take 2 tablets (650 mg total) by mouth every 6 (six) hours as needed. 07/11/20   Earnstine Regal, PA-C  albuterol (VENTOLIN HFA) 108 (90 Base) MCG/ACT inhaler Inhale 2 puffs into the lungs every 6 (six) hours as needed for wheezing or shortness of breath. 12/21/21   Carvel Getting, NP  amLODipine (NORVASC) 5 MG tablet Take 1 tablet (5 mg total) by mouth daily. 10/13/21   Larey Dresser, MD  azithromycin (ZITHROMAX Z-PAK) 250 MG tablet As directed 12/23/21   Hassell Done, Mary-Margaret, FNP  benzonatate (TESSALON PERLES) 100 MG capsule Take 1 capsule (100 mg total) by mouth 3 (three) times daily as needed for cough. 12/23/21   Hassell Done Mary-Margaret, FNP  DULoxetine (CYMBALTA) 60 MG capsule Take 1 capsule (60 mg total) by mouth daily. 11/02/21   Arfeen, Arlyce Harman, MD  Spacer/Aero-Holding Josiah Lobo DEVI 1 each by Does not apply route as needed. 12/21/21   Carvel Getting, NP  Trolamine Salicylate (BLUE-EMU HEMP EX) Apply 1 tablet topically in the morning and at bedtime. Hemp muscadine    [provider]  valsartan (DIOVAN) 160 MG tablet TAKE 1 TABLET BY MOUTH EVERY DAY 10/15/21   Marrian Salvage, FNP    Family History Family History  Problem Relation Age of Onset   Hypertension Mother    Other Mother        congenital heart defect - bicuspid aortic valve   Diabetes Father    Heart disease Father    Hypertension Father    Stroke Maternal Grandfather 78       dementia   Cancer Paternal Grandfather        testicular   Mitral valve prolapse Maternal Grandmother    Heart disease Paternal Grandmother    Breast cancer Paternal Grandmother    Other Sister        neurologic issue    Social History Social History   Tobacco Use   Smoking status: Never   Smokeless tobacco: Never  Vaping Use   Vaping Use: Never used  Substance Use Topics   Alcohol use: Yes    Comment: Rarely   Drug use: No     Allergies   Benzodiazepines, Other, Augmentin  [amoxicillin-pot clavulanate], Lamictal [lamotrigine], and Penicillins   Review of Systems Review of Systems Pertinent negatives listed in HPI   Physical Exam Triage Vital Signs ED Triage Vitals  Enc Vitals Group     BP 12/25/21 1346 (!) 148/97     Pulse Rate 12/25/21 1346 89     Resp 12/25/21 1346 18     Temp 12/25/21 1346 98.9 F (37.2 C)     Temp Source 12/25/21 1346 Oral  SpO2 12/25/21 1346 97 %     Weight --      Height --      Head Circumference --      Peak Flow --      Pain Score 12/25/21 1352 0     Pain Loc --      Pain Edu? --      Excl. in [place]? --    No data found.  Updated Vital Signs BP (!) 148/97 (BP Location: Left Arm)    Pulse 89    Temp 98.9 F (37.2 C) (Oral)    Resp 18    LMP 06/19/2020 (Approximate)    SpO2 97%   Visual Acuity Right Eye Distance:   Left Eye Distance:   Bilateral Distance:    Right Eye Near:   Left Eye Near:    Bilateral Near:     Physical Exam Constitutional:      Appearance: She is ill-appearing.  HENT:     Head: Normocephalic and atraumatic.     Right Ear: Tympanic membrane, ear canal and external ear normal.     Left Ear: Tympanic membrane, ear canal and external ear normal.     Nose: Nose normal.     Mouth/Throat:     Mouth: Mucous membranes are moist.  Eyes:     Extraocular Movements: Extraocular movements intact.     Pupils: Pupils are equal, round, and reactive to light.  Cardiovascular:     Rate and Rhythm: Normal rate and regular rhythm.  Pulmonary:     Breath sounds: Wheezing and rhonchi present.     Comments: Expiratory wheeze present (generalized on exam) Musculoskeletal:        General: Normal range of motion.     Cervical back: Normal range of motion.  Skin:    Capillary Refill: Capillary refill takes less than 2 seconds.  Neurological:     General: No focal deficit present.     Mental Status: She is alert and oriented to person, place, and time.  Psychiatric:        Mood and Affect: Mood  normal.        Behavior: Behavior normal.        Thought Content: Thought content normal.        Judgment: Judgment normal.    UC Treatments / Results  Labs (all labs ordered are listed, but only abnormal results are displayed) Labs Reviewed  NOVEL CORONAVIRUS, NAA  POCT INFLUENZA A/B    EKG   Radiology No results found.  Procedures Procedures (including critical care time)  Medications Ordered in UC Medications  albuterol (PROVENTIL) (2.5 MG/3ML) 0.083% nebulizer solution 2.5 mg (has no administration in time range)    Initial Impression / Assessment and Plan / UC Course  I have reviewed the triage vital signs and the nursing notes.  Pertinent labs & imaging results that were available during my care of the patient were reviewed by me and considered in my medical decision making (see chart for details).    Rapid influenza negative. PCR COVID pending. Nebulizer treatment given here in clinic with Albuterol.  Patient tolerated treatment. Advised to continue with recently prescribed treatment. Extended prednisone two additional days. Promethazine DM as needed for cough. Hydrate well with fluids. Strict Return Precaution Given Final Clinical Impressions(s) / UC Diagnoses   Final diagnoses:  Encounter for laboratory testing for COVID-19 virus  Viral infection of lower respiratory system   Discharge Instructions   None    ED  Prescriptions     Medication Sig Dispense Auth. Provider   promethazine-dextromethorphan (PROMETHAZINE-DM) 6.25-15 MG/5ML syrup Take 5 mLs by mouth 4 (four) times daily as needed for cough. 180 mL Scot Jun, FNP   predniSONE (DELTASONE) 10 MG tablet Take 2 tablets (20 mg total) by mouth daily with breakfast for 1 day, THEN 1 tablet (10 mg total) daily with breakfast for 1 day. 3 tablet Scot Jun, FNP      PDMP not reviewed this encounter.   Scot Jun, FNP 12/25/21 1536

## 2021-12-26 LAB — NOVEL CORONAVIRUS, NAA: SARS-CoV-2, NAA: NOT DETECTED

## 2021-12-26 LAB — SARS-COV-2, NAA 2 DAY TAT

## 2022-01-08 ENCOUNTER — Emergency Department (HOSPITAL_COMMUNITY): Payer: BC Managed Care – PPO

## 2022-01-08 ENCOUNTER — Inpatient Hospital Stay (HOSPITAL_COMMUNITY)
Admission: EM | Admit: 2022-01-08 | Discharge: 2022-01-11 | DRG: 200 | Disposition: A | Discharge status: Home / Self Care | Payer: BC Managed Care – PPO | Attending: General Surgery | Admitting: General Surgery

## 2022-01-08 ENCOUNTER — Encounter (HOSPITAL_COMMUNITY): Payer: Self-pay

## 2022-01-08 ENCOUNTER — Other Ambulatory Visit: Payer: Self-pay

## 2022-01-08 DIAGNOSIS — M542 Cervicalgia: Secondary | ICD-10-CM | POA: Diagnosis not present

## 2022-01-08 DIAGNOSIS — I951 Orthostatic hypotension: Secondary | ICD-10-CM | POA: Diagnosis not present

## 2022-01-08 DIAGNOSIS — Z881 Allergy status to other antibiotic agents status: Secondary | ICD-10-CM | POA: Diagnosis not present

## 2022-01-08 DIAGNOSIS — Z823 Family history of stroke: Secondary | ICD-10-CM

## 2022-01-08 DIAGNOSIS — R079 Chest pain, unspecified: Secondary | ICD-10-CM | POA: Diagnosis not present

## 2022-01-08 DIAGNOSIS — S3991XA Unspecified injury of abdomen, initial encounter: Secondary | ICD-10-CM | POA: Diagnosis not present

## 2022-01-08 DIAGNOSIS — Z8249 Family history of ischemic heart disease and other diseases of the circulatory system: Secondary | ICD-10-CM | POA: Diagnosis not present

## 2022-01-08 DIAGNOSIS — R0602 Shortness of breath: Secondary | ICD-10-CM | POA: Diagnosis present

## 2022-01-08 DIAGNOSIS — S2239XA Fracture of one rib, unspecified side, initial encounter for closed fracture: Secondary | ICD-10-CM

## 2022-01-08 DIAGNOSIS — R001 Bradycardia, unspecified: Secondary | ICD-10-CM | POA: Diagnosis present

## 2022-01-08 DIAGNOSIS — M4802 Spinal stenosis, cervical region: Secondary | ICD-10-CM | POA: Diagnosis not present

## 2022-01-08 DIAGNOSIS — Z880 Allergy status to penicillin: Secondary | ICD-10-CM | POA: Diagnosis not present

## 2022-01-08 DIAGNOSIS — Z833 Family history of diabetes mellitus: Secondary | ICD-10-CM

## 2022-01-08 DIAGNOSIS — Z20822 Contact with and (suspected) exposure to covid-19: Secondary | ICD-10-CM | POA: Diagnosis present

## 2022-01-08 DIAGNOSIS — Z9049 Acquired absence of other specified parts of digestive tract: Secondary | ICD-10-CM

## 2022-01-08 DIAGNOSIS — S62522A Displaced fracture of distal phalanx of left thumb, initial encounter for closed fracture: Secondary | ICD-10-CM | POA: Diagnosis present

## 2022-01-08 DIAGNOSIS — J939 Pneumothorax, unspecified: Secondary | ICD-10-CM

## 2022-01-08 DIAGNOSIS — Z888 Allergy status to other drugs, medicaments and biological substances status: Secondary | ICD-10-CM | POA: Diagnosis not present

## 2022-01-08 DIAGNOSIS — S42001A Fracture of unspecified part of right clavicle, initial encounter for closed fracture: Secondary | ICD-10-CM | POA: Diagnosis present

## 2022-01-08 DIAGNOSIS — Z803 Family history of malignant neoplasm of breast: Secondary | ICD-10-CM

## 2022-01-08 DIAGNOSIS — I700 Atherosclerosis of aorta: Secondary | ICD-10-CM | POA: Diagnosis not present

## 2022-01-08 DIAGNOSIS — W19XXXA Unspecified fall, initial encounter: Secondary | ICD-10-CM | POA: Diagnosis not present

## 2022-01-08 DIAGNOSIS — J9811 Atelectasis: Secondary | ICD-10-CM | POA: Diagnosis not present

## 2022-01-08 DIAGNOSIS — R0789 Other chest pain: Secondary | ICD-10-CM | POA: Diagnosis not present

## 2022-01-08 DIAGNOSIS — Y9352 Activity, horseback riding: Secondary | ICD-10-CM | POA: Diagnosis not present

## 2022-01-08 DIAGNOSIS — I10 Essential (primary) hypertension: Secondary | ICD-10-CM | POA: Diagnosis present

## 2022-01-08 DIAGNOSIS — I471 Supraventricular tachycardia: Secondary | ICD-10-CM | POA: Diagnosis not present

## 2022-01-08 DIAGNOSIS — S2243XA Multiple fractures of ribs, bilateral, initial encounter for closed fracture: Secondary | ICD-10-CM | POA: Diagnosis not present

## 2022-01-08 DIAGNOSIS — I9589 Other hypotension: Secondary | ICD-10-CM | POA: Diagnosis not present

## 2022-01-08 DIAGNOSIS — M7989 Other specified soft tissue disorders: Secondary | ICD-10-CM | POA: Diagnosis not present

## 2022-01-08 DIAGNOSIS — J90 Pleural effusion, not elsewhere classified: Secondary | ICD-10-CM | POA: Diagnosis not present

## 2022-01-08 DIAGNOSIS — F419 Anxiety disorder, unspecified: Secondary | ICD-10-CM | POA: Diagnosis not present

## 2022-01-08 DIAGNOSIS — G8911 Acute pain due to trauma: Secondary | ICD-10-CM | POA: Diagnosis not present

## 2022-01-08 DIAGNOSIS — K7689 Other specified diseases of liver: Secondary | ICD-10-CM | POA: Diagnosis not present

## 2022-01-08 DIAGNOSIS — R519 Headache, unspecified: Secondary | ICD-10-CM | POA: Diagnosis not present

## 2022-01-08 DIAGNOSIS — J439 Emphysema, unspecified: Secondary | ICD-10-CM | POA: Diagnosis not present

## 2022-01-08 DIAGNOSIS — F32A Depression, unspecified: Secondary | ICD-10-CM | POA: Diagnosis present

## 2022-01-08 DIAGNOSIS — K429 Umbilical hernia without obstruction or gangrene: Secondary | ICD-10-CM | POA: Diagnosis not present

## 2022-01-08 DIAGNOSIS — E039 Hypothyroidism, unspecified: Secondary | ICD-10-CM | POA: Diagnosis present

## 2022-01-08 DIAGNOSIS — S2241XA Multiple fractures of ribs, right side, initial encounter for closed fracture: Secondary | ICD-10-CM | POA: Diagnosis not present

## 2022-01-08 DIAGNOSIS — S270XXA Traumatic pneumothorax, initial encounter: Secondary | ICD-10-CM | POA: Diagnosis not present

## 2022-01-08 DIAGNOSIS — M47812 Spondylosis without myelopathy or radiculopathy, cervical region: Secondary | ICD-10-CM | POA: Diagnosis not present

## 2022-01-08 HISTORY — DX: Fracture of one rib, unspecified side, initial encounter for closed fracture: S22.39XA

## 2022-01-08 LAB — URINALYSIS, ROUTINE W REFLEX MICROSCOPIC
Bilirubin Urine: NEGATIVE
Glucose, UA: NEGATIVE mg/dL
Hgb urine dipstick: NEGATIVE
Ketones, ur: 20 mg/dL — AB
Leukocytes,Ua: NEGATIVE
Nitrite: NEGATIVE
Protein, ur: NEGATIVE mg/dL
Specific Gravity, Urine: 1.043 — ABNORMAL HIGH (ref 1.005–1.030)
pH: 7 (ref 5.0–8.0)

## 2022-01-08 LAB — COMPREHENSIVE METABOLIC PANEL
ALT: 50 U/L — ABNORMAL HIGH (ref 0–44)
AST: 71 U/L — ABNORMAL HIGH (ref 15–41)
Albumin: 4 g/dL (ref 3.5–5.0)
Alkaline Phosphatase: 68 U/L (ref 38–126)
Anion gap: 7 (ref 5–15)
BUN: 26 mg/dL — ABNORMAL HIGH (ref 6–20)
CO2: 28 mmol/L (ref 22–32)
Calcium: 9.2 mg/dL (ref 8.9–10.3)
Chloride: 106 mmol/L (ref 98–111)
Creatinine, Ser: 0.77 mg/dL (ref 0.44–1.00)
GFR, Estimated: >60 mL/min (ref >60)
Glucose, Bld: 120 mg/dL — ABNORMAL HIGH (ref 70–99)
Potassium: 3.4 mmol/L — ABNORMAL LOW (ref 3.5–5.1)
Sodium: 141 mmol/L (ref 135–145)
Total Bilirubin: 0.3 mg/dL (ref 0.3–1.2)
Total Protein: 6.6 g/dL (ref 6.5–8.1)

## 2022-01-08 LAB — TYPE AND SCREEN
ABO/RH(D): B POS
Antibody Screen: NEGATIVE
Unit division: 0
Unit division: 0

## 2022-01-08 LAB — BPAM RBC
Blood Product Expiration Date: 202302062359
Blood Product Expiration Date: 202302132359
ISSUE DATE / TIME: 202301201530
ISSUE DATE / TIME: 202301201531
Unit Type and Rh: 5100
Unit Type and Rh: 5100

## 2022-01-08 LAB — RESP PANEL BY RT-PCR (FLU A&B, COVID) ARPGX2
Influenza A by PCR: NEGATIVE
Influenza B by PCR: NEGATIVE
SARS Coronavirus 2 by RT PCR: NEGATIVE

## 2022-01-08 LAB — I-STAT CHEM 8, ED
BUN: 30 mg/dL — ABNORMAL HIGH (ref 6–20)
Calcium, Ion: 1.18 mmol/L (ref 1.15–1.40)
Chloride: 104 mmol/L (ref 98–111)
Creatinine, Ser: 0.7 mg/dL (ref 0.44–1.00)
Glucose, Bld: 114 mg/dL — ABNORMAL HIGH (ref 70–99)
HCT: 42 % (ref 36.0–46.0)
Hemoglobin: 14.3 g/dL (ref 12.0–15.0)
Potassium: 3.5 mmol/L (ref 3.5–5.1)
Sodium: 141 mmol/L (ref 135–145)
TCO2: 29 mmol/L (ref 22–32)

## 2022-01-08 LAB — CBC
HCT: 39.8 % (ref 36.0–46.0)
Hemoglobin: 13 g/dL (ref 12.0–15.0)
MCH: 28.8 pg (ref 26.0–34.0)
MCHC: 32.7 g/dL (ref 30.0–36.0)
MCV: 88.1 fL (ref 80.0–100.0)
Platelets: 292 10*3/uL (ref 150–400)
RBC: 4.52 MIL/uL (ref 3.87–5.11)
RDW: 12.6 % (ref 11.5–15.5)
WBC: 24.7 10*3/uL — ABNORMAL HIGH (ref 4.0–10.5)
nRBC: 0 % (ref 0.0–0.2)

## 2022-01-08 LAB — MRSA NEXT GEN BY PCR, NASAL: MRSA by PCR Next Gen: NOT DETECTED

## 2022-01-08 LAB — PROTIME-INR
INR: 1 (ref 0.8–1.2)
Prothrombin Time: 13.1 seconds (ref 11.4–15.2)

## 2022-01-08 LAB — ETHANOL: Alcohol, Ethyl (B): <10 mg/dL (ref <10)

## 2022-01-08 LAB — LACTIC ACID, PLASMA: Lactic Acid, Venous: 1.1 mmol/L (ref 0.5–1.9)

## 2022-01-08 MED ORDER — ONDANSETRON HCL 4 MG/2ML IJ SOLN
4.0000 mg | Freq: Once | INTRAMUSCULAR | Status: AC
  Administered 2022-01-08: 4 mg via INTRAVENOUS
  Filled 2022-01-08: qty 2

## 2022-01-08 MED ORDER — LACTATED RINGERS IV SOLN
INTRAVENOUS | Status: DC
Start: 2022-01-08 — End: 2022-01-10

## 2022-01-08 MED ORDER — HYDROMORPHONE HCL 1 MG/ML IJ SOLN
0.5000 mg | Freq: Once | INTRAMUSCULAR | Status: AC
  Administered 2022-01-08: 0.5 mg via INTRAVENOUS
  Filled 2022-01-08: qty 1

## 2022-01-08 MED ORDER — OXYCODONE HCL 5 MG PO TABS
10.0000 mg | ORAL_TABLET | ORAL | Status: DC | PRN
  Administered 2022-01-08 – 2022-01-09 (×4): 10 mg via ORAL
  Filled 2022-01-08 (×4): qty 2

## 2022-01-08 MED ORDER — ACETAMINOPHEN 325 MG PO TABS
650.0000 mg | ORAL_TABLET | Freq: Four times a day (QID) | ORAL | Status: DC
  Administered 2022-01-08 – 2022-01-09 (×3): 650 mg via ORAL
  Filled 2022-01-08 (×3): qty 2

## 2022-01-08 MED ORDER — INFLUENZA VAC SPLIT QUAD 0.5 ML IM SUSY
0.5000 mL | PREFILLED_SYRINGE | INTRAMUSCULAR | Status: DC
  Filled 2022-01-08: qty 0.5

## 2022-01-08 MED ORDER — HYDROMORPHONE HCL 1 MG/ML IJ SOLN: 1.0000 mg | Freq: Once | INTRAMUSCULAR | Status: DC

## 2022-01-08 MED ORDER — OXYCODONE HCL 5 MG PO TABS: 5.0000 mg | ORAL_TABLET | ORAL | Status: DC | PRN

## 2022-01-08 MED ORDER — HYDROMORPHONE HCL 1 MG/ML IJ SOLN
0.5000 mg | INTRAMUSCULAR | Status: DC | PRN
  Administered 2022-01-09 – 2022-01-10 (×2): 0.5 mg via INTRAVENOUS
  Filled 2022-01-08 (×2): qty 0.5

## 2022-01-08 MED ORDER — LIDOCAINE-EPINEPHRINE (PF) 2 %-1:200000 IJ SOLN
10.0000 mL | Freq: Once | INTRAMUSCULAR | Status: DC
  Filled 2022-01-08: qty 20

## 2022-01-08 MED ORDER — IOHEXOL 350 MG/ML SOLN
100.0000 mL | Freq: Once | INTRAVENOUS | Status: AC | PRN
  Administered 2022-01-08: 100 mL via INTRAVENOUS

## 2022-01-08 MED ORDER — DOCUSATE SODIUM 100 MG PO CAPS
100.0000 mg | ORAL_CAPSULE | Freq: Two times a day (BID) | ORAL | Status: DC
  Administered 2022-01-08 – 2022-01-11 (×6): 100 mg via ORAL
  Filled 2022-01-08 (×6): qty 1

## 2022-01-08 MED ORDER — ONDANSETRON HCL 4 MG/2ML IJ SOLN
4.0000 mg | Freq: Four times a day (QID) | INTRAMUSCULAR | Status: DC | PRN
  Administered 2022-01-09 – 2022-01-10 (×2): 4 mg via INTRAVENOUS
  Filled 2022-01-08 (×3): qty 2

## 2022-01-08 MED ORDER — METHOCARBAMOL 750 MG PO TABS
750.0000 mg | ORAL_TABLET | Freq: Three times a day (TID) | ORAL | Status: DC
  Administered 2022-01-08: 750 mg via ORAL
  Filled 2022-01-08: qty 1

## 2022-01-08 MED ORDER — MELATONIN 3 MG PO TABS
3.0000 mg | ORAL_TABLET | Freq: Every evening | ORAL | Status: DC | PRN
  Administered 2022-01-09 – 2022-01-10 (×3): 3 mg via ORAL
  Filled 2022-01-08 (×3): qty 1

## 2022-01-08 MED ORDER — ENOXAPARIN SODIUM 30 MG/0.3ML IJ SOSY
30.0000 mg | PREFILLED_SYRINGE | Freq: Two times a day (BID) | INTRAMUSCULAR | Status: DC
  Administered 2022-01-09 – 2022-01-10 (×4): 30 mg via SUBCUTANEOUS
  Filled 2022-01-08 (×5): qty 0.3

## 2022-01-08 MED ORDER — ONDANSETRON 4 MG PO TBDP: 4.0000 mg | ORAL_TABLET | Freq: Four times a day (QID) | ORAL | Status: DC | PRN

## 2022-01-08 NOTE — ED Provider Triage Note (Signed)
Emergency Medicine Provider Triage Evaluation Note  Stephanie Gray , a 56 y.o. female  was evaluated in triage.  Pt complains of rib and chest pain after falling off of a horse onto concrete  Review of Systems  Positive: Short of breath Negative: No loss of conciousness   Physical Exam  BP 110/75 (BP Location: Right Arm)    Pulse 65    Temp 97.7 F (36.5 C) (Oral)    Resp 18    Ht 5\' 1"  (1.549 m)    Wt 63 kg    LMP 06/19/2020 (Approximate)    SpO2 99%    BMI 26.26 kg/m  Gen:   Awake, no distress   Resp:  Normal effort  MSK:   Moves extremities without difficulty  Other:    Medical Decision Making  Medically screening exam initiated at 2:05 PM.  Appropriate orders placed.  [name] was informed that the remainder of the evaluation will be completed by another provider, this initial triage assessment does not replace that evaluation, and the importance of remaining in the ED until their evaluation is complete.  Pt crying in pain and having difficulty breathing.  RN notified to room pt.    Fransico Meadow, [place] 01/08/22 1407

## 2022-01-08 NOTE — Progress Notes (Signed)
Pt arrived to [facility] from ED. Pt is A&Ox4 c/o pain 8/10 to right ribs see eMAR for intervention. CHG bath completed. Assessment and VS documented. Pt oriented to unit lying bed call bell within reach.

## 2022-01-08 NOTE — ED Provider Notes (Signed)
[facility] EMERGENCY DEPARTMENT Provider Note   CSN: [id number] Arrival date & time: 01/08/22  1258     History  Chief Complaint  Patient presents with   Gerda Diss I Canal is a 56 y.o. female.  56 yo F with a chief complaint of a fall from a horse.  The patient was horseback riding and had gone over a significant jump and through turn she had a falling right words and backwards off of the horse and landed on the concrete.  And struck her head primarily and had significant damage to her helmet.  Complaining mostly of right upper back pain and difficulty breathing.  It sounds like it knocked the wind out of her and she had some significant difficulty breathing for a few minutes.  Denied loss of consciousness.  Denies neck pain leg pain abdominal pain.  Denies blood thinner use.  The history is provided by the patient and a friend.  Fall This is a new problem. The current episode started 1 to 2 hours ago. The problem occurs rarely. The problem has not changed since onset.Associated symptoms include chest pain, headaches and shortness of breath. Nothing aggravates the symptoms. Nothing relieves the symptoms. She has tried nothing for the symptoms. The treatment provided no relief.      Home Medications Prior to Admission medications   Medication Sig Start Date End Date Taking? Authorizing Provider  albuterol (VENTOLIN HFA) 108 (90 Base) MCG/ACT inhaler Inhale 2 puffs into the lungs every 6 (six) hours as needed for wheezing or shortness of breath. 12/21/21  Yes Carvel Getting, NP  amLODipine (NORVASC) 5 MG tablet Take 1 tablet (5 mg total) by mouth daily. 10/13/21  Yes Larey Dresser, MD  DULoxetine (CYMBALTA) 60 MG capsule Take 1 capsule (60 mg total) by mouth daily. 11/02/21  Yes Arfeen, Arlyce Harman, MD  Trolamine Salicylate (BLUE-EMU HEMP EX) Take 1 tablet by mouth daily. Hemp muscadine   Yes [provider]  Turmeric (QC TUMERIC COMPLEX PO) Take 1  tablet by mouth daily.   Yes [provider]  valsartan (DIOVAN) 160 MG tablet TAKE 1 TABLET BY MOUTH EVERY DAY Patient taking differently: Take 160 mg by mouth daily. 10/15/21  Yes Marrian Salvage, FNP  acetaminophen (TYLENOL) 325 MG tablet Take 2 tablets (650 mg total) by mouth every 6 (six) hours as needed. Patient not taking: Reported on 01/08/2022 07/11/20   Earnstine Regal, PA-C  azithromycin (ZITHROMAX Z-PAK) 250 MG tablet As directed Patient not taking: Reported on 01/08/2022 12/23/21   Chevis Pretty, FNP  benzonatate (TESSALON PERLES) 100 MG capsule Take 1 capsule (100 mg total) by mouth 3 (three) times daily as needed for cough. Patient not taking: Reported on 01/08/2022 12/23/21   Chevis Pretty, FNP  promethazine-dextromethorphan (PROMETHAZINE-DM) 6.25-15 MG/5ML syrup Take 5 mLs by mouth 4 (four) times daily as needed for cough. Patient not taking: Reported on 01/08/2022 12/25/21   Scot Jun, FNP  Spacer/Aero-Holding Josiah Lobo DEVI 1 each by Does not apply route as needed. Patient not taking: Reported on 01/08/2022 12/21/21   Carvel Getting, NP      Allergies    Benzodiazepines, Other, Augmentin [amoxicillin-pot clavulanate], Lamictal [lamotrigine], and Penicillins    Review of Systems   Review of Systems  Constitutional:  Negative for chills and fever.  HENT:  Negative for congestion and rhinorrhea.   Eyes:  Negative for redness and visual disturbance.  Respiratory:  Positive for shortness of breath.  Negative for wheezing.   Cardiovascular:  Positive for chest pain. Negative for palpitations.  Gastrointestinal:  Negative for nausea and vomiting.  Genitourinary:  Negative for dysuria and urgency.  Musculoskeletal:  Positive for arthralgias and myalgias.  Skin:  Negative for pallor and wound.  Neurological:  Positive for headaches. Negative for dizziness.   Physical Exam Updated Vital Signs BP 106/70 (BP Location: Left Arm)    Pulse 77    Temp  98.2 F (36.8 C) (Oral)    Resp 12    Ht 5\' 1"  (1.549 m)    Wt 63 kg    LMP 06/19/2020 (Approximate)    SpO2 98%    BMI 26.26 kg/m  Physical Exam Vitals and nursing note reviewed.  Constitutional:      General: She is not in acute distress.    Appearance: She is well-developed. She is not diaphoretic.  HENT:     Head: Normocephalic and atraumatic.  Eyes:     Pupils: Pupils are equal, round, and reactive to light.  Cardiovascular:     Rate and Rhythm: Normal rate and regular rhythm.     Heart sounds: No murmur heard.   No friction rub. No gallop.  Pulmonary:     Effort: Pulmonary effort is normal.     Breath sounds: No wheezing or rales.  Abdominal:     General: There is no distension.     Palpations: Abdomen is soft.     Tenderness: There is no abdominal tenderness.  Musculoskeletal:        General: Tenderness present.     Cervical back: Normal range of motion and neck supple.     Comments: Tenderness worse about the right posterior rib angles.  Pain and swelling to the left distal thumb.  Full range of motion at the interphalangeal joints and the MCP.  Palpated from head to toe without other obvious noted areas of bony tenderness.  Skin:    General: Skin is warm and dry.  Neurological:     Mental Status: She is alert and oriented to person, place, and time.  Psychiatric:        Behavior: Behavior normal.    ED Results / Procedures / Treatments   Labs (all labs ordered are listed, but only abnormal results are displayed) Labs Reviewed  COMPREHENSIVE METABOLIC PANEL - Abnormal; Notable for the following components:      Result Value   Potassium 3.4 (*)    Glucose, Bld 120 (*)    BUN 26 (*)    AST 71 (*)    ALT 50 (*)    All other components within normal limits  CBC - Abnormal; Notable for the following components:   WBC 24.7 (*)    All other components within normal limits  URINALYSIS, ROUTINE W REFLEX MICROSCOPIC - Abnormal; Notable for the following components:    Specific Gravity, Urine 1.043 (*)    Ketones, ur 20 (*)    All other components within normal limits  I-STAT CHEM 8, ED - Abnormal; Notable for the following components:   BUN 30 (*)    Glucose, Bld 114 (*)    All other components within normal limits  RESP PANEL BY RT-PCR (FLU A&B, COVID) ARPGX2  MRSA NEXT GEN BY PCR, NASAL  ETHANOL  LACTIC ACID, PLASMA  PROTIME-INR  HIV ANTIBODY (ROUTINE TESTING W REFLEX)  CBC  BASIC METABOLIC PANEL  TYPE AND SCREEN    EKG None  Radiology CT HEAD WO CONTRAST  Result  Date: 01/08/2022 CLINICAL DATA:  Fall from horse.  Right chest pain. EXAM: CT HEAD WITHOUT CONTRAST CT CERVICAL SPINE WITHOUT CONTRAST TECHNIQUE: Multidetector CT imaging of the head and cervical spine was performed following the standard protocol without intravenous contrast. Multiplanar CT image reconstructions of the cervical spine were also generated. RADIATION DOSE REDUCTION: This exam was performed according to the departmental dose-optimization program which includes automated exposure control, adjustment of the mA and/or kV according to patient size and/or use of iterative reconstruction technique. COMPARISON:  None. FINDINGS: CT HEAD FINDINGS Brain: The brainstem, cerebellum, cerebral peduncles, thalami, basal ganglia, basilar cisterns, and ventricular system appear within normal limits. No intracranial hemorrhage, mass lesion, or acute CVA. Vascular: Unremarkable Skull: Unremarkable Sinuses/Orbits: Chronic left sphenoid sinusitis. Oval-shaped 1.1 by 0.7 cm left frontal sinus lesion along the anterior wall, appearance favoring either a ground-glass density osteoma or small focus of fibrous dysplasia. Other: No supplemental non-categorized findings. CT CERVICAL SPINE FINDINGS Alignment: 1.5 mm degenerative retrolisthesis at C5-6. Dextroconvex cervical scoliosis which may be positional. Skull base and vertebrae: No fracture or acute bony findings identified. Soft tissues and spinal canal:  Gas tracking along the right strap musculature related to the known right pneumothorax. Disc levels: Borderline right foraminal stenosis at C5-6 due to intervertebral and facet spurring. Loss of disc height at C5-6 due to degenerative disc disease. Substantial degenerative facet arthropathy on the right at C4-5. Upper chest: Pneumothorax visualized at the right lung apex, as shown on recent chest CT. Other: No supplemental non-categorized findings. IMPRESSION: 1. No acute intracranial findings are acute cervical spine findings. 2. Cervical spondylosis and degenerative disc disease. 3. Known right-sided pneumothorax with a small amount of gas tracking along the right strap musculature. 4. Chronic left sphenoid sinusitis. 5. Oval-shaped ground-glass density lesion along the anterior wall the left frontal sinus, appearance favoring either a low-density osteoma or a small focus of fibrous dysplasia. Electronically Signed   By: Van Clines M.D.   On: 01/08/2022 16:56   CT CERVICAL SPINE WO CONTRAST  Result Date: 01/08/2022 CLINICAL DATA:  Fall from horse.  Right chest pain. EXAM: CT HEAD WITHOUT CONTRAST CT CERVICAL SPINE WITHOUT CONTRAST TECHNIQUE: Multidetector CT imaging of the head and cervical spine was performed following the standard protocol without intravenous contrast. Multiplanar CT image reconstructions of the cervical spine were also generated. RADIATION DOSE REDUCTION: This exam was performed according to the departmental dose-optimization program which includes automated exposure control, adjustment of the mA and/or kV according to patient size and/or use of iterative reconstruction technique. COMPARISON:  None. FINDINGS: CT HEAD FINDINGS Brain: The brainstem, cerebellum, cerebral peduncles, thalami, basal ganglia, basilar cisterns, and ventricular system appear within normal limits. No intracranial hemorrhage, mass lesion, or acute CVA. Vascular: Unremarkable Skull: Unremarkable Sinuses/Orbits:  Chronic left sphenoid sinusitis. Oval-shaped 1.1 by 0.7 cm left frontal sinus lesion along the anterior wall, appearance favoring either a ground-glass density osteoma or small focus of fibrous dysplasia. Other: No supplemental non-categorized findings. CT CERVICAL SPINE FINDINGS Alignment: 1.5 mm degenerative retrolisthesis at C5-6. Dextroconvex cervical scoliosis which may be positional. Skull base and vertebrae: No fracture or acute bony findings identified. Soft tissues and spinal canal: Gas tracking along the right strap musculature related to the known right pneumothorax. Disc levels: Borderline right foraminal stenosis at C5-6 due to intervertebral and facet spurring. Loss of disc height at C5-6 due to degenerative disc disease. Substantial degenerative facet arthropathy on the right at C4-5. Upper chest: Pneumothorax visualized at the right lung apex, as  shown on recent chest CT. Other: No supplemental non-categorized findings. IMPRESSION: 1. No acute intracranial findings are acute cervical spine findings. 2. Cervical spondylosis and degenerative disc disease. 3. Known right-sided pneumothorax with a small amount of gas tracking along the right strap musculature. 4. Chronic left sphenoid sinusitis. 5. Oval-shaped ground-glass density lesion along the anterior wall the left frontal sinus, appearance favoring either a low-density osteoma or a small focus of fibrous dysplasia. Electronically Signed   By: Van Clines M.D.   On: 01/08/2022 16:56   CT CHEST ABDOMEN PELVIS W CONTRAST  Result Date: 01/08/2022 CLINICAL DATA:  Fall from a horse.  Head and body trauma. EXAM: CT CHEST, ABDOMEN, AND PELVIS WITH CONTRAST TECHNIQUE: Multidetector CT imaging of the chest, abdomen and pelvis was performed following the standard protocol during bolus administration of intravenous contrast. RADIATION DOSE REDUCTION: This exam was performed according to the departmental dose-optimization program which includes  automated exposure control, adjustment of the mA and/or kV according to patient size and/or use of iterative reconstruction technique. CONTRAST:  140mL OMNIPAQUE IOHEXOL 350 MG/ML SOLN COMPARISON:  Chest radiograph 01/08/2022 and CT examinations from 07/11/2020 10/21/2021 FINDINGS: CT CHEST FINDINGS Cardiovascular: Upper normal heart size. No acute vascular findings. Mediastinum/Nodes: No substantial mediastinal hematoma. The esophagus appears unremarkable. Lungs/Pleura: Proximally 10% right pneumothorax. Trace amount of right pleural fluid, making this a hydropneumothorax. Dependent density in the right lung is primarily from atelectasis although a component of mild contusion in the right lower lobe cannot be readily excluded. Small amount of gas tracks in the soft tissues of the right chest wall and up along the right strap muscles. Linear subsegmental atelectasis in the lingula and left lower lobe. Musculoskeletal: Mildly comminuted midclavicular fracture on image 13 of series 4. Old well corticated medial clavicular head ossicle on image 26 series 4. Acute fractures the right first, second, third, fourth, fifth, sixth, eighth, and ninth ribs noted the second rib fracture may be minimally comminuted and the fourth and fifth rib fractures are minimally displaced. The third, fourth, and fifth rib fractures are segmental. CT ABDOMEN PELVIS FINDINGS Hepatobiliary: Two fluid density right hepatic lobe cysts are present. Prior cholecystectomy. Common bile duct mildly prominent at 0.8 cm diameter, although this may be a physiologic response to cholecystectomy. No perihepatic ascites or well-defined hepatic laceration identified. Pancreas: Unremarkable Spleen: Unremarkable Adrenals/Urinary Tract: Unremarkable Stomach/Bowel: Unremarkable Vascular/Lymphatic: Minimal aortic atherosclerotic calcification. Reproductive: Unremarkable Other: No supplemental non-categorized findings. Musculoskeletal: Small umbilical hernia  contains adipose tissue. Edema along the right flank potentially from bruising, image 68 series 3. IMPRESSION: 1. Acute fractures of the right first through sixth ribs as well as the eighth and ninth ribs, with segmental fractures at the third, fourth, and fifth ribs. Mildly comminuted fracture the right midclavicle. 2. Approximally 10% right pneumothorax with trace right pleural effusion. Dependent airspace opacity in the right lung is primarily atelectatic although there could be a component of pulmonary contusion. 3. Other imaging findings of potential clinical significance: Trace abdominal aortic atherosclerotic calcification. Small umbilical hernia contains adipose tissue. Probable bruising in the subcutaneous tissues of the right flank. I reviewed these findings in person at the workstation with the trauma team at the time of interpretation on 01/08/2022 at 4:10 pm, including Dr. Georganna Skeans, who verbally acknowledged these results. Electronically Signed   By: Van Clines M.D.   On: 01/08/2022 16:27   DG Chest Port 1 View  Result Date: 01/08/2022 CLINICAL DATA:  Thrown from horse. EXAM: PORTABLE CHEST 1 VIEW COMPARISON:  07/11/2020 FINDINGS: 1425 hours. Small right apical pneumothorax evident with acute fracture noted in the right second, third, and fourth ribs. No substantial pleural effusion. Left lung clear The cardiopericardial silhouette is within normal limits for size. Possible mid right clavicle fracture. Telemetry leads overlie the chest. IMPRESSION: 1. Small right apical pneumothorax with fractures of the right second, third and fourth ribs. 2. Possible mid right clavicle fracture. Electronically Signed   By: Misty Stanley M.D.   On: 01/08/2022 14:39   DG Finger Thumb Left  Result Date: 01/08/2022 CLINICAL DATA:  Trauma and pain. EXAM: LEFT THUMB 2+V COMPARISON:  None. FINDINGS: Transverse fracture involving the proximal portion of the distal phalanx of the thumb. Minimal  displacement of distal fracture fragment volarly. No intra-articular extension. Overlying soft tissue swelling. IMPRESSION: Distal phalangeal fracture of the thumb. Electronically Signed   By: Abigail Miyamoto M.D.   On: 01/08/2022 14:39    Procedures Procedures   Emergency Focused Ultrasound Exam Limited Ultrasound of the Abdomen and Pericardium (FAST Exam)  Performed and interpreted by Dr. Tyrone Nine Indication: Trauma Multiple views of the abdomen and pericardium are obtained with a multi-frequency probe. Findings: no anechoic fluid in abdomen, no anechoic fluid surrounding heart Interpretation: no hemoperitoneum, no pericardial effusion, without tamponade difficult window in the left upper quadrant but no obvious fluid about the spleen, kidney or lung. Images archived electronically.  CPT Codes: cardiac [phone], abdomen [phone] (study includes both codes)    Medications Ordered in ED Medications  lidocaine-EPINEPHrine (XYLOCAINE W/EPI) 2 %-1:200000 (PF) injection 10 mL (10 mLs Intradermal Not Given 01/08/22 1530)  enoxaparin (LOVENOX) injection 30 mg (has no administration in time range)  acetaminophen (TYLENOL) tablet 650 mg (650 mg Oral Given 01/08/22 1814)  oxyCODONE (Oxy IR/ROXICODONE) immediate release tablet 5 mg (has no administration in time range)  oxyCODONE (Oxy IR/ROXICODONE) immediate release tablet 10 mg (10 mg Oral Given 01/08/22 1814)  HYDROmorphone (DILAUDID) injection 0.5 mg (has no administration in time range)  ondansetron (ZOFRAN-ODT) disintegrating tablet 4 mg (has no administration in time range)    Or  ondansetron (ZOFRAN) injection 4 mg (has no administration in time range)  melatonin tablet 3 mg (has no administration in time range)  docusate sodium (COLACE) capsule 100 mg (has no administration in time range)  lactated ringers infusion ( Intravenous New Bag/Given 01/08/22 1813)  methocarbamol (ROBAXIN) tablet 750 mg (has no administration in time range)  influenza vac  split quadrivalent PF (FLUARIX) injection 0.5 mL (has no administration in time range)  ondansetron (ZOFRAN) injection 4 mg (4 mg Intravenous Given 01/08/22 1602)  HYDROmorphone (DILAUDID) injection 0.5 mg (0.5 mg Intravenous Given 01/08/22 1602)  iohexol (OMNIPAQUE) 350 MG/ML injection 100 mL (100 mLs Intravenous Contrast Given 01/08/22 1555)    ED Course/ Medical Decision Making/ A&P                           Medical Decision Making Amount and/or Complexity of Data Reviewed Labs: ordered. Radiology: ordered.  Risk Prescription drug management. Decision regarding hospitalization.   56 yo F with a chief complaint of a fall from a horse.  She was apparently going at a fairly high rate of speed and she fell backwards off the horse and struck her head.  Complaining mostly of pain to the right upper back and difficulty breathing.  We will obtain a CT scan of the head through the pelvis.  Plain film of the chest.  Blood work.  Treat pain.  Reassess.  Plain film of the chest with multiple rib fractures on the right subcutaneous emphysema as viewed by me.  No obvious pneumothorax.  Radiology read with small apical pneumothorax.  I discussed the case with Dr. Grandville Silos, trauma surgery.  Will have trauma come and evaluated bedside.  Patient had blood drawn and had an episode where she became bradycardic and hypotensive.  I suspect likely this was vasovagal but activated as a level 1.  Plain film of the left thumb viewed by me with a very minimally displaced distal phalanx fracture.  I discussed this with Hilbert Odor covering for hand.  Initial plan for trephination of the nail is my initial exam was concerning for a significant subungual hematoma though on repeat exam not greater than about 5%.  Will place in a static splint.  Trauma admit.   CRITICAL CARE Performed by: Cecilio Asper   Total critical care time: 35 minutes  Critical care time was exclusive of separately billable  procedures and treating other patients.  Critical care was necessary to treat or prevent imminent or life-threatening deterioration.  Critical care was time spent personally by me on the following activities: development of treatment plan with patient and/or surrogate as well as nursing, discussions with consultants, evaluation of patient's response to treatment, examination of patient, obtaining history from patient or surrogate, ordering and performing treatments and interventions, ordering and review of laboratory studies, ordering and review of radiographic studies, pulse oximetry and re-evaluation of patient's condition.  The patients results and plan were reviewed and discussed.   Any x-rays performed were independently reviewed by myself.   Differential diagnosis were considered with the presenting HPI.  Medications  lidocaine-EPINEPHrine (XYLOCAINE W/EPI) 2 %-1:200000 (PF) injection 10 mL (10 mLs Intradermal Not Given 01/08/22 1530)  enoxaparin (LOVENOX) injection 30 mg (has no administration in time range)  acetaminophen (TYLENOL) tablet 650 mg (650 mg Oral Given 01/08/22 1814)  oxyCODONE (Oxy IR/ROXICODONE) immediate release tablet 5 mg (has no administration in time range)  oxyCODONE (Oxy IR/ROXICODONE) immediate release tablet 10 mg (10 mg Oral Given 01/08/22 1814)  HYDROmorphone (DILAUDID) injection 0.5 mg (has no administration in time range)  ondansetron (ZOFRAN-ODT) disintegrating tablet 4 mg (has no administration in time range)    Or  ondansetron (ZOFRAN) injection 4 mg (has no administration in time range)  melatonin tablet 3 mg (has no administration in time range)  docusate sodium (COLACE) capsule 100 mg (has no administration in time range)  lactated ringers infusion ( Intravenous New Bag/Given 01/08/22 1813)  methocarbamol (ROBAXIN) tablet 750 mg (has no administration in time range)  influenza vac split quadrivalent PF (FLUARIX) injection 0.5 mL (has no administration in  time range)  ondansetron (ZOFRAN) injection 4 mg (4 mg Intravenous Given 01/08/22 1602)  HYDROmorphone (DILAUDID) injection 0.5 mg (0.5 mg Intravenous Given 01/08/22 1602)  iohexol (OMNIPAQUE) 350 MG/ML injection 100 mL (100 mLs Intravenous Contrast Given 01/08/22 1555)    Vitals:   01/08/22 1630 01/08/22 1645 01/08/22 1700 01/08/22 1738  BP: 124/84 119/74 111/72 106/70  Pulse: 89 90 85 77  Resp: 13 (!) 8 12 12   Temp:    98.2 F (36.8 C)  TempSrc:    Oral  SpO2: 98% 99% 99% 98%  Weight:      Height:        Final diagnoses:  Chest pain  Closed fracture of multiple ribs of right side, initial encounter  Closed displaced fracture of distal phalanx of left thumb,  initial encounter  Traumatic pneumothorax, initial encounter    Admission/ observation were discussed with the admitting physician, patient and/or family and they are comfortable with the plan.          Final Clinical Impression(s) / ED Diagnoses Final diagnoses:  Chest pain  Closed fracture of multiple ribs of right side, initial encounter  Closed displaced fracture of distal phalanx of left thumb, initial encounter  Traumatic pneumothorax, initial encounter    Rx / DC Orders ED Discharge Orders     None         Deno Etienne, DO 01/08/22 1956

## 2022-01-08 NOTE — H&P (Signed)
Stephanie Gray [birth year]  [id number].    Requesting MD: Dr. Deno Etienne Chief Complaint/Reason for Consult: Level 1  Primary Survey: airway intact, breath sounds intact bilaterally, pulses intact peripherally  GCS: 15  HPI: Stephanie Gray is a 56 y.o. female who presented to the ED after falling from horse.  Patient was horseback riding when she fell backwards off the horse, landing on concrete and striking her head.  She was wearing a helmet. No LOC.  She complains of right upper back/rib pain and shortness of breath due to pain with inspiration. FAST negative. Chest x-ray done in the trauma bay showed right apical pneumothorax with R rib fx's, possible R clavicle fx. She also had a L thumb xray done that showed a distal phalangeal fx. Trauma was called for admission. During my exam the patient developed an episode of light-headedness, bradycardia with HR 49-53, followed by hypotension with a systolic BP 86'V. Pt was upgraded to a level 1 trauma for hypotension.   Patient husband at bedside states patient is very sensitive to medications. Has had an elective surgery cancelled in the past because she was given a benzo before anesthesia and the code team head to be called as a precaution due to her sxs.  PMHx: HTN, Hypothyroidism,  Surgical Hx: C-Section, Appendectomy, Cholecystectomy, Tubal ligation, Bladder Repair, R Breast Lumpectomy Allergies: PCN, Benzo's, Lamictal  Blood thinners: none   ROS: Review of Systems  Constitutional: Negative.   HENT: Negative.    Eyes: Negative.   Respiratory:  Positive for shortness of breath.   Cardiovascular:  Positive for chest pain.  Gastrointestinal: Negative.   Genitourinary: Negative.   Musculoskeletal:  Positive for falls.  Skin: Negative.   Neurological: Negative.   Endo/Heme/Allergies:  Positive for environmental allergies.  Psychiatric/Behavioral:  Positive for depression.    Family History  Problem Relation Age of Onset    Hypertension Mother    Other Mother        congenital heart defect - bicuspid aortic valve   Diabetes Father    Heart disease Father    Hypertension Father    Stroke Maternal Grandfather 37       dementia   Cancer Paternal Grandfather        testicular   Mitral valve prolapse Maternal Grandmother    Heart disease Paternal Grandmother    Breast cancer Paternal Grandmother    Other Sister        neurologic issue    Past Medical History:  Diagnosis Date   Anxiety    Apocrine metaplasia of breast, right 03/03/2018   Arthritis    Atrial tachycardia, paroxysmal ([place])    noted on event monitor 02/2020   Breast mass, right 04/12/2017   Cervical disc disease    Complication of anesthesia    very sensitive to all mdications,needs lower dosage   Depression    Hypertension    Hypothyroidism 03/17/2018   Neuromuscular disorder (HCC)    numbness in 2 fingers of left hand   Perimenopausal disorder 03/17/2018   Premature atrial contractions    PVC's (premature ventricular contractions)    noted on heart monitor 02/2020   Routine general medical examination at a health care facility 04/06/2016   Tubular adenoma of colon 04/27/2015   04/07/2015 @ 70 cm ,polyp; Dr Collene Mares Due 2019     Past Surgical History:  Procedure Laterality Date   APPENDECTOMY     BLADDER REPAIR     BREAST LUMPECTOMY WITH  RADIOACTIVE SEED LOCALIZATION Right 02/10/2017   Procedure: RIGHT BREAST LUMPECTOMY WITH RADIOACTIVE SEED LOCALIZATION;  Surgeon: Autumn Messing III, MD;  Location: [place];  Service: General;  Laterality: Right;   CESAREAN SECTION     X 2, Dr Reino Kent   CHOLECYSTECTOMY N/A 07/11/2020   Procedure: LAPAROSCOPIC CHOLECYSTECTOMY;  Surgeon: Donnie Mesa, MD;  Location: [place];  Service: General;  Laterality: N/A;   EYE SURGERY Bilateral    Lasik    TUBAL LIGATION     bladder laceration repair    Social History:  reports that she has never smoked. She has never used smokeless tobacco. She reports current alcohol  use. She reports that she does not use drugs.  Allergies:  Allergies  Allergen Reactions   Benzodiazepines Other (See Comments)    Patient needed crash cart after tubal ligation   Other Other (See Comments)    Patient is extremely sensitive to all medicines, maybe needs 1/4 or 1/8 dosage of normal adult   Augmentin [Amoxicillin-Pot Clavulanate] Other (See Comments)   Lamictal [Lamotrigine] Rash   Penicillins Rash    Rash because of a history of documented adverse serious drug reaction;Medi Alert bracelet  is recommended (CHILDHOOD REACTION) Has patient had a PCN reaction causing immediate rash, facial/tongue/throat swelling, SOB or lightheadedness with hypotension:UNKNOWN Has patient had a PCN reaction causing severe rash involving mucus membranes or skin necrosis:unsure Has patient had a PCN reaction that required hospitalization:unsure Has patient had a PCN reaction occurring within the last 10 years:unsure     (Not in a hospital admission)    Physical Exam: Blood pressure 122/72, pulse 81, temperature 97.7 F (36.5 C), temperature source Oral, resp. rate 18, height 5\' 1"  (1.549 m), weight 63 kg, last menstrual period 06/19/2020, SpO2 100 %. General: pleasant, WD/WN white female who is laying in bed, appears uncomfortable HEENT: head is normocephalic, atraumatic.  Sclera are noninjected.  PERRL.  Ears and nose without any masses or lesions.  Mouth is pink and moist. Good dentition.  Heart: sinus rhythm, Normal s1,s2. No obvious murmurs, gallops, or rubs noted.  Palpable pedal pulses bilaterally  Lungs: R posterior-lateral chest wall TTP, normal effort on 2L [place], splinted respirations due to pain, CTAB, diminished breath sounds bilateral bases,no wheezes, rhonchi, or rales noted.  Abd:  Soft, NT/ND, +BS, no masses, hernias, or organomegaly Skin: warm and dry with no masses, lesions, or rashes Psych: A&Ox4 with an appropriate affect Neuro: cranial nerves grossly intact, equal  strength in BUE/BLE bilaterally, normal speech, thought process intact, moves all extremities, gait not assessed Msk:  RUE: tenderness over R clavicle, no tenderness over elbow, forearm, wrists or hand. Radial 2+.  LUE: No gross deformities. Able passive/active shoulder, elbow, wrist and hand range of motion without pain.  No tenderness over shoulder, upper arm, elbow, forearm, wrists. There is pain and swelling of left thumb. Radial 2+.  RLE: Able passive/active range of motion of hip, knee, ankle and all digits of the foot without pain.  No lower extremity edema.  No calf tenderness.   LLE:  Able passive/active range of motion of hip, knee, ankle and all digits of the foot without pain. No lower extremity edema.  No calf tenderness.   Back: not fully examined .  Results for orders placed or performed during the hospital encounter of 01/08/22 (from the past 48 hour(s))  Type and screen Ordered by PROVIDER DEFAULT     Status: None (Preliminary result)   Collection Time: 01/08/22  3:20 PM  Result Value Ref Range   ABO/RH(D) B POS    Antibody Screen PENDING    Sample Expiration      01/11/2022,2359 Performed at [facility], [place] [address], [place], [place] [address]    Unit Number [id number]    Blood Component Type RED CELLS,LR    Unit division 00    Status of Unit ISSUED    Unit tag comment EMERGENCY RELEASE    Transfusion Status OK TO TRANSFUSE    Crossmatch Result PENDING    Unit Number [id number]    Blood Component Type RED CELLS,LR    Unit division 00    Status of Unit ISSUED    Unit tag comment EMERGENCY RELEASE    Transfusion Status OK TO TRANSFUSE    Crossmatch Result PENDING   I-Stat Chem 8, ED     Status: Abnormal   Collection Time: 01/08/22  3:28 PM  Result Value Ref Range   Sodium 141 135 - 145 mmol/L   Potassium 3.5 3.5 - 5.1 mmol/L   Chloride 104 98 - 111 mmol/L   BUN 30 (H) 6 - 20 mg/dL   Creatinine, Ser 0.70 0.44 - 1.00 mg/dL   Glucose, Bld 114 (H)  70 - 99 mg/dL    Comment: Glucose reference range applies only to samples taken after fasting for at least 8 hours.   Calcium, Ion 1.18 1.15 - 1.40 mmol/L   TCO2 29 22 - 32 mmol/L   Hemoglobin 14.3 12.0 - 15.0 g/dL   HCT 42.0 36.0 - 46.0 %   DG Chest Port 1 View  Result Date: 01/08/2022 CLINICAL DATA:  Thrown from horse. EXAM: PORTABLE CHEST 1 VIEW COMPARISON:  07/11/2020 FINDINGS: 1425 hours. Small right apical pneumothorax evident with acute fracture noted in the right second, third, and fourth ribs. No substantial pleural effusion. Left lung clear The cardiopericardial silhouette is within normal limits for size. Possible mid right clavicle fracture. Telemetry leads overlie the chest. IMPRESSION: 1. Small right apical pneumothorax with fractures of the right second, third and fourth ribs. 2. Possible mid right clavicle fracture. Electronically Signed   By: Misty Stanley M.D.   On: 01/08/2022 14:39   DG Finger Thumb Left  Result Date: 01/08/2022 CLINICAL DATA:  Trauma and pain. EXAM: LEFT THUMB 2+V COMPARISON:  None. FINDINGS: Transverse fracture involving the proximal portion of the distal phalanx of the thumb. Minimal displacement of distal fracture fragment volarly. No intra-articular extension. Overlying soft tissue swelling. IMPRESSION: Distal phalangeal fracture of the thumb. Electronically Signed   By: Abigail Miyamoto M.D.   On: 01/08/2022 14:39    Anti-infectives (From admission, onward)    None       Assessment/Plan Fall From Horse R apical PTX - currently oxygenating on 2L [place], repeat CXR in AM R 1-6, 8-9 rib fx's - multimodal pain control, patient states she is sensitive to medications, will keep this in mind when dosing narcotics R clavicle fx - ortho c/s; sling and OP follow up per Dr. Mable Fill L thumb distal phalangeal fx - Hand consulted by Dr. Tyrone Nine, splinted Hypotension and bradycardia- possible vasovagal event, resolved in ED with IVF and observation; continue telemetry    FEN - Regular, IVF VTE - SCD's, Lovenox ID - None  Foley - None Dispo - Admit to trauma service for observation, pain control, PT/OT and orthopedic consults  Critical care 70min  Georganna Skeans, MD, MPH, FACS Please use [email or web address] to contact on call provider

## 2022-01-08 NOTE — Progress Notes (Signed)
Trauma Event Note   Rounded on patient. IS at bedside. CAGE AID completed. No needs at this time.  Last imported Vital Signs BP (!) 109/55 (BP Location: Left Arm)    Pulse 73    Temp 98.5 F (36.9 C) (Oral)    Resp 19    Ht 5\' 1"  (1.549 m)    Wt 139 lb (63 kg)    LMP 06/19/2020 (Approximate)    SpO2 99%    BMI 26.26 kg/m   Trending CBC Recent Labs    01/08/22 1520 01/08/22 1528  WBC 24.7*  --   HGB 13.0 14.3  HCT 39.8 42.0  PLT 292  --     Trending Coag's Recent Labs    01/08/22 1520  INR 1.0    Trending BMET Recent Labs    01/08/22 1520 01/08/22 1528  NA 141 141  K 3.4* 3.5  CL 106 104  CO2 28  --   BUN 26* 30*  CREATININE 0.77 0.70  GLUCOSE 120* 114*       O   Trauma Response RN  Please call TRN at [phone] for further assistance.

## 2022-01-08 NOTE — ED Triage Notes (Signed)
Pt BIB [facility] from home c/o a fall off of a horse onto cement. Pt hit her head but was wearing a helmet. Pt is c/o right rib pain.    150 mcg fent 18g LAC

## 2022-01-08 NOTE — TOC CAGE-AID Note (Signed)
Transition of Care [facility]) - CAGE-AID Screening   Patient Details  Name: Stephanie Gray MRN: [id number] Date of Birth: 05/12/1966  Transition of Care [facility] CM/SW Contact:    Army Melia, RN Phone [phone] 01/08/2022, 9:48 PM   Clinical Narrative:  Presents to hospital after a fall from a horse. Denies alcohol or drug use, no need for resources.  CAGE-AID Screening:    Have You Ever Felt You Ought to Cut Down on Your Drinking or Drug Use?: No Have People Annoyed You By Critizing Your Drinking Or Drug Use?: No Have You Felt Bad Or Guilty About Your Drinking Or Drug Use?: No Have You Ever Had a Drink or Used Drugs First Thing In The Morning to Steady Your Nerves or to Get Rid of a Hangover?: No CAGE-AID Score: 0  Substance Abuse Education Offered: No

## 2022-01-08 NOTE — Progress Notes (Signed)
Orthopedic Tech Progress Note Patient Details:  Stephanie Gray Apr 21, 1966 [id number]  Patient ID: [name], female   DOB: 09-08-1966, 56 y.o.   MRN: [id number]  [name]  01/08/2022, 3:54 PM Responded to level 1 trauma

## 2022-01-08 NOTE — Progress Notes (Signed)
..  Trauma Response Nurse Documentation   Stephanie Gray is a 56 y.o. female arriving to [facility] ED via EMS  On No antithrombotic. Trauma was activated as a Level 1 by EDP/ TRN based on the following trauma criteria Anytime Systolic Blood Pressure < 90. Trauma team at the bedside on patient arrival. Patient cleared for CT by Dr. Grandville Silos. Patient to CT with team. GCS 15.  History   Past Medical History:  Diagnosis Date   Anxiety    Apocrine metaplasia of breast, right 03/03/2018   Arthritis    Atrial tachycardia, paroxysmal ([place])    noted on event monitor 02/2020   Breast mass, right 04/12/2017   Cervical disc disease    Complication of anesthesia    very sensitive to all mdications,needs lower dosage   Depression    Hypertension    Hypothyroidism 03/17/2018   Neuromuscular disorder (HCC)    numbness in 2 fingers of left hand   Perimenopausal disorder 03/17/2018   Premature atrial contractions    PVC's (premature ventricular contractions)    noted on heart monitor 02/2020   Routine general medical examination at a health care facility 04/06/2016   Tubular adenoma of colon 04/27/2015   04/07/2015 @ 70 cm ,polyp; Dr Collene Mares Due 2019      Past Surgical History:  Procedure Laterality Date   APPENDECTOMY     BLADDER REPAIR     BREAST LUMPECTOMY WITH RADIOACTIVE SEED LOCALIZATION Right 02/10/2017   Procedure: RIGHT BREAST LUMPECTOMY WITH RADIOACTIVE SEED LOCALIZATION;  Surgeon: Autumn Messing III, MD;  Location: [place];  Service: General;  Laterality: Right;   CESAREAN SECTION     X 2, Dr Reino Kent   CHOLECYSTECTOMY N/A 07/11/2020   Procedure: LAPAROSCOPIC CHOLECYSTECTOMY;  Surgeon: Donnie Mesa, MD;  Location: [place];  Service: General;  Laterality: N/A;   EYE SURGERY Bilateral    Lasik    TUBAL LIGATION     bladder laceration repair       Initial Focused Assessment (If applicable, or please see trauma documentation): Pt was in rm 5 - became hypotensive, in the 60s- Trauma PA at the  bedside- IV NS started- 500cc bolus given, pt still remained hypotensive-- Level 1 activated. Transported to CT with primary nurse Berkley Harvey, RN and TRN, Dr Grandville Silos and PA also at bedside.   CT's Completed:   CT Head, CT C-Spine, CT Chest w/ contrast, and CT abdomen/pelvis w/ contrast   Interventions: CT scans-- regular xrays, lab work   Plan for disposition:  Admission to floor    [place] RN  Please call TRN at [phone] for further assistance.

## 2022-01-09 ENCOUNTER — Inpatient Hospital Stay (HOSPITAL_COMMUNITY): Payer: BC Managed Care – PPO

## 2022-01-09 LAB — BASIC METABOLIC PANEL
Anion gap: 8 (ref 5–15)
BUN: 16 mg/dL (ref 6–20)
CO2: 25 mmol/L (ref 22–32)
Calcium: 8.7 mg/dL — ABNORMAL LOW (ref 8.9–10.3)
Chloride: 105 mmol/L (ref 98–111)
Creatinine, Ser: 0.77 mg/dL (ref 0.44–1.00)
GFR, Estimated: >60 mL/min (ref >60)
Glucose, Bld: 121 mg/dL — ABNORMAL HIGH (ref 70–99)
Potassium: 3.5 mmol/L (ref 3.5–5.1)
Sodium: 138 mmol/L (ref 135–145)

## 2022-01-09 LAB — CBC
HCT: 35.1 % — ABNORMAL LOW (ref 36.0–46.0)
Hemoglobin: 11.7 g/dL — ABNORMAL LOW (ref 12.0–15.0)
MCH: 29.1 pg (ref 26.0–34.0)
MCHC: 33.3 g/dL (ref 30.0–36.0)
MCV: 87.3 fL (ref 80.0–100.0)
Platelets: 256 10*3/uL (ref 150–400)
RBC: 4.02 MIL/uL (ref 3.87–5.11)
RDW: 12.8 % (ref 11.5–15.5)
WBC: 9.9 10*3/uL (ref 4.0–10.5)
nRBC: 0 % (ref 0.0–0.2)

## 2022-01-09 LAB — HIV ANTIBODY (ROUTINE TESTING W REFLEX): HIV Screen 4th Generation wRfx: NONREACTIVE

## 2022-01-09 LAB — BLOOD PRODUCT ORDER (VERBAL) VERIFICATION

## 2022-01-09 MED ORDER — METHOCARBAMOL 500 MG PO TABS
1000.0000 mg | ORAL_TABLET | Freq: Three times a day (TID) | ORAL | Status: DC
  Administered 2022-01-09 (×3): 1000 mg via ORAL
  Filled 2022-01-09 (×3): qty 2

## 2022-01-09 MED ORDER — GUAIFENESIN ER 600 MG PO TB12
600.0000 mg | ORAL_TABLET | Freq: Two times a day (BID) | ORAL | Status: DC
  Administered 2022-01-09 – 2022-01-11 (×5): 600 mg via ORAL
  Filled 2022-01-09 (×5): qty 1

## 2022-01-09 MED ORDER — LIDOCAINE 5 % EX PTCH
1.0000 | MEDICATED_PATCH | CUTANEOUS | Status: DC
  Administered 2022-01-09 – 2022-01-11 (×3): 1 via TRANSDERMAL
  Filled 2022-01-09 (×3): qty 1

## 2022-01-09 MED ORDER — ACETAMINOPHEN 500 MG PO TABS
1000.0000 mg | ORAL_TABLET | Freq: Four times a day (QID) | ORAL | Status: DC
  Administered 2022-01-09 – 2022-01-11 (×7): 1000 mg via ORAL
  Filled 2022-01-09 (×9): qty 2

## 2022-01-09 NOTE — Progress Notes (Signed)
Progress Note     Subjective: Pt reports right sided chest pain. Coughing with a lot of thick mucus that is hard to get up. Pulling 1000 on IS. Denies SOB. Denies abdominal pain. Denies LE pain.   Objective: Vital signs in last 24 hours: Temp:  [97.7 F (36.5 C)-98.7 F (37.1 C)] 97.9 F (36.6 C) (01/21 0754) Pulse Rate:  [50-90] 71 (01/21 0754) Resp:  [8-21] 21 (01/21 0754) BP: (64-150)/(45-85) 127/75 (01/21 0754) SpO2:  [93 %-100 %] 100 % (01/21 0754) Weight:  [63 kg] 63 kg (01/20 1306) Last BM Date: 01/08/22  Intake/Output from previous day: 01/20 0701 - 01/21 0700 In: 1076.1 [P.O.:480; I.V.:596.1] Out: 1750 [Urine:1750] Intake/Output this shift: No intake/output data recorded.  PE: General: pleasant, WD, WN female who is laying in bed and appears uncomfortable HEENT: head is normocephalic, atraumatic.  Sclera are noninjected.   Heart: regular, rate, and rhythm. Palpable radial and pedal pulses bilaterally Lungs: CTAB, no wheezes, rhonchi, or rales noted.  Respiratory effort nonlabored Abd: soft, NT, ND MS: RUE in sling with mild edema over right shoulder, ROM intact at R wrist and R hand NVI. LUE normal. BLE normal  Skin: warm and dry with no masses, lesions, or rashes Neuro: Cranial nerves 2-12 grossly intact, sensation is normal throughout Psych: A&Ox3 with an appropriate affect.    Lab Results:  Recent Labs    01/08/22 1520 01/08/22 1528 01/09/22 0231  WBC 24.7*  --  9.9  HGB 13.0 14.3 11.7*  HCT 39.8 42.0 35.1*  PLT 292  --  256   BMET Recent Labs    01/08/22 1520 01/08/22 1528 01/09/22 0231  NA 141 141 138  K 3.4* 3.5 3.5  CL 106 104 105  CO2 28  --  25  GLUCOSE 120* 114* 121*  BUN 26* 30* 16  CREATININE 0.77 0.70 0.77  CALCIUM 9.2  --  8.7*   PT/INR Recent Labs    01/08/22 1520  LABPROT 13.1  INR 1.0   CMP     Component Value Date/Time   NA 138 01/09/2022 0231   K 3.5 01/09/2022 0231   CL 105 01/09/2022 0231   CO2 25  01/09/2022 0231   GLUCOSE 121 (H) 01/09/2022 0231   BUN 16 01/09/2022 0231   CREATININE 0.77 01/09/2022 0231   CALCIUM 8.7 (L) 01/09/2022 0231   PROT 6.6 01/08/2022 1520   ALBUMIN 4.0 01/08/2022 1520   AST 71 (H) 01/08/2022 1520   ALT 50 (H) 01/08/2022 1520   ALKPHOS 68 01/08/2022 1520   BILITOT 0.3 01/08/2022 1520   GFRNONAA >60 01/09/2022 0231   GFRAA >60 07/11/2020 0612   Lipase     Component Value Date/Time   LIPASE 48 07/11/2020 0612       Studies/Results: CT HEAD WO CONTRAST  Result Date: 01/08/2022 CLINICAL DATA:  Fall from horse.  Right chest pain. EXAM: CT HEAD WITHOUT CONTRAST CT CERVICAL SPINE WITHOUT CONTRAST TECHNIQUE: Multidetector CT imaging of the head and cervical spine was performed following the standard protocol without intravenous contrast. Multiplanar CT image reconstructions of the cervical spine were also generated. RADIATION DOSE REDUCTION: This exam was performed according to the departmental dose-optimization program which includes automated exposure control, adjustment of the mA and/or kV according to patient size and/or use of iterative reconstruction technique. COMPARISON:  None. FINDINGS: CT HEAD FINDINGS Brain: The brainstem, cerebellum, cerebral peduncles, thalami, basal ganglia, basilar cisterns, and ventricular system appear within normal limits. No intracranial hemorrhage, mass lesion,  or acute CVA. Vascular: Unremarkable Skull: Unremarkable Sinuses/Orbits: Chronic left sphenoid sinusitis. Oval-shaped 1.1 by 0.7 cm left frontal sinus lesion along the anterior wall, appearance favoring either a ground-glass density osteoma or small focus of fibrous dysplasia. Other: No supplemental non-categorized findings. CT CERVICAL SPINE FINDINGS Alignment: 1.5 mm degenerative retrolisthesis at C5-6. Dextroconvex cervical scoliosis which may be positional. Skull base and vertebrae: No fracture or acute bony findings identified. Soft tissues and spinal canal: Gas  tracking along the right strap musculature related to the known right pneumothorax. Disc levels: Borderline right foraminal stenosis at C5-6 due to intervertebral and facet spurring. Loss of disc height at C5-6 due to degenerative disc disease. Substantial degenerative facet arthropathy on the right at C4-5. Upper chest: Pneumothorax visualized at the right lung apex, as shown on recent chest CT. Other: No supplemental non-categorized findings. IMPRESSION: 1. No acute intracranial findings are acute cervical spine findings. 2. Cervical spondylosis and degenerative disc disease. 3. Known right-sided pneumothorax with a small amount of gas tracking along the right strap musculature. 4. Chronic left sphenoid sinusitis. 5. Oval-shaped ground-glass density lesion along the anterior wall the left frontal sinus, appearance favoring either a low-density osteoma or a small focus of fibrous dysplasia. Electronically Signed   By: Van Clines M.D.   On: 01/08/2022 16:56   CT CERVICAL SPINE WO CONTRAST  Result Date: 01/08/2022 CLINICAL DATA:  Fall from horse.  Right chest pain. EXAM: CT HEAD WITHOUT CONTRAST CT CERVICAL SPINE WITHOUT CONTRAST TECHNIQUE: Multidetector CT imaging of the head and cervical spine was performed following the standard protocol without intravenous contrast. Multiplanar CT image reconstructions of the cervical spine were also generated. RADIATION DOSE REDUCTION: This exam was performed according to the departmental dose-optimization program which includes automated exposure control, adjustment of the mA and/or kV according to patient size and/or use of iterative reconstruction technique. COMPARISON:  None. FINDINGS: CT HEAD FINDINGS Brain: The brainstem, cerebellum, cerebral peduncles, thalami, basal ganglia, basilar cisterns, and ventricular system appear within normal limits. No intracranial hemorrhage, mass lesion, or acute CVA. Vascular: Unremarkable Skull: Unremarkable Sinuses/Orbits:  Chronic left sphenoid sinusitis. Oval-shaped 1.1 by 0.7 cm left frontal sinus lesion along the anterior wall, appearance favoring either a ground-glass density osteoma or small focus of fibrous dysplasia. Other: No supplemental non-categorized findings. CT CERVICAL SPINE FINDINGS Alignment: 1.5 mm degenerative retrolisthesis at C5-6. Dextroconvex cervical scoliosis which may be positional. Skull base and vertebrae: No fracture or acute bony findings identified. Soft tissues and spinal canal: Gas tracking along the right strap musculature related to the known right pneumothorax. Disc levels: Borderline right foraminal stenosis at C5-6 due to intervertebral and facet spurring. Loss of disc height at C5-6 due to degenerative disc disease. Substantial degenerative facet arthropathy on the right at C4-5. Upper chest: Pneumothorax visualized at the right lung apex, as shown on recent chest CT. Other: No supplemental non-categorized findings. IMPRESSION: 1. No acute intracranial findings are acute cervical spine findings. 2. Cervical spondylosis and degenerative disc disease. 3. Known right-sided pneumothorax with a small amount of gas tracking along the right strap musculature. 4. Chronic left sphenoid sinusitis. 5. Oval-shaped ground-glass density lesion along the anterior wall the left frontal sinus, appearance favoring either a low-density osteoma or a small focus of fibrous dysplasia. Electronically Signed   By: Van Clines M.D.   On: 01/08/2022 16:56   CT CHEST ABDOMEN PELVIS W CONTRAST  Result Date: 01/08/2022 CLINICAL DATA:  Fall from a horse.  Head and body trauma. EXAM: CT CHEST,  ABDOMEN, AND PELVIS WITH CONTRAST TECHNIQUE: Multidetector CT imaging of the chest, abdomen and pelvis was performed following the standard protocol during bolus administration of intravenous contrast. RADIATION DOSE REDUCTION: This exam was performed according to the departmental dose-optimization program which includes  automated exposure control, adjustment of the mA and/or kV according to patient size and/or use of iterative reconstruction technique. CONTRAST:  166mL OMNIPAQUE IOHEXOL 350 MG/ML SOLN COMPARISON:  Chest radiograph 01/08/2022 and CT examinations from 07/11/2020 10/21/2021 FINDINGS: CT CHEST FINDINGS Cardiovascular: Upper normal heart size. No acute vascular findings. Mediastinum/Nodes: No substantial mediastinal hematoma. The esophagus appears unremarkable. Lungs/Pleura: Proximally 10% right pneumothorax. Trace amount of right pleural fluid, making this a hydropneumothorax. Dependent density in the right lung is primarily from atelectasis although a component of mild contusion in the right lower lobe cannot be readily excluded. Small amount of gas tracks in the soft tissues of the right chest wall and up along the right strap muscles. Linear subsegmental atelectasis in the lingula and left lower lobe. Musculoskeletal: Mildly comminuted midclavicular fracture on image 13 of series 4. Old well corticated medial clavicular head ossicle on image 26 series 4. Acute fractures the right first, second, third, fourth, fifth, sixth, eighth, and ninth ribs noted the second rib fracture may be minimally comminuted and the fourth and fifth rib fractures are minimally displaced. The third, fourth, and fifth rib fractures are segmental. CT ABDOMEN PELVIS FINDINGS Hepatobiliary: Two fluid density right hepatic lobe cysts are present. Prior cholecystectomy. Common bile duct mildly prominent at 0.8 cm diameter, although this may be a physiologic response to cholecystectomy. No perihepatic ascites or well-defined hepatic laceration identified. Pancreas: Unremarkable Spleen: Unremarkable Adrenals/Urinary Tract: Unremarkable Stomach/Bowel: Unremarkable Vascular/Lymphatic: Minimal aortic atherosclerotic calcification. Reproductive: Unremarkable Other: No supplemental non-categorized findings. Musculoskeletal: Small umbilical hernia  contains adipose tissue. Edema along the right flank potentially from bruising, image 68 series 3. IMPRESSION: 1. Acute fractures of the right first through sixth ribs as well as the eighth and ninth ribs, with segmental fractures at the third, fourth, and fifth ribs. Mildly comminuted fracture the right midclavicle. 2. Approximally 10% right pneumothorax with trace right pleural effusion. Dependent airspace opacity in the right lung is primarily atelectatic although there could be a component of pulmonary contusion. 3. Other imaging findings of potential clinical significance: Trace abdominal aortic atherosclerotic calcification. Small umbilical hernia contains adipose tissue. Probable bruising in the subcutaneous tissues of the right flank. I reviewed these findings in person at the workstation with the trauma team at the time of interpretation on 01/08/2022 at 4:10 pm, including Dr. Georganna Skeans, who verbally acknowledged these results. Electronically Signed   By: Van Clines M.D.   On: 01/08/2022 16:27   DG Chest Port 1 View  Result Date: 01/08/2022 CLINICAL DATA:  Thrown from horse. EXAM: PORTABLE CHEST 1 VIEW COMPARISON:  07/11/2020 FINDINGS: 1425 hours. Small right apical pneumothorax evident with acute fracture noted in the right second, third, and fourth ribs. No substantial pleural effusion. Left lung clear The cardiopericardial silhouette is within normal limits for size. Possible mid right clavicle fracture. Telemetry leads overlie the chest. IMPRESSION: 1. Small right apical pneumothorax with fractures of the right second, third and fourth ribs. 2. Possible mid right clavicle fracture. Electronically Signed   By: Misty Stanley M.D.   On: 01/08/2022 14:39   DG Finger Thumb Left  Result Date: 01/08/2022 CLINICAL DATA:  Trauma and pain. EXAM: LEFT THUMB 2+V COMPARISON:  None. FINDINGS: Transverse fracture involving the proximal portion of the  distal phalanx of the thumb. Minimal  displacement of distal fracture fragment volarly. No intra-articular extension. Overlying soft tissue swelling. IMPRESSION: Distal phalangeal fracture of the thumb. Electronically Signed   By: Abigail Miyamoto M.D.   On: 01/08/2022 14:39    Anti-infectives: Anti-infectives (From admission, onward)    None        Assessment/Plan Fall From Horse R apical PTX - currently oxygenating on 2L [place], repeat CXR in AM, CXR this AM with stable R apical PTX R 1-6, 8-9 rib fx's - multimodal pain control, patient states she is sensitive to medications, will keep this in mind when dosing narcotics R clavicle fx - ortho c/s; sling and OP follow up per Dr. Mable Fill L thumb distal phalangeal fx - Hand consulted by Dr. Tyrone Nine, splinted Hypotension and bradycardia- possible vasovagal event, resolved in ED with IVF and observation; continue telemetry    FEN - Regular, IVF VTE - SCD's, Lovenox ID - None  Foley - None  Dispo - Pain control. PT/OT to see. Keep on supplemental O2 today and encourage IS. Possible discharge in 1-2 days pending therapy evals and medical readiness.   LOS: 1 day   I reviewed last 24 h vitals and pain scores, last 48 h intake and output, last 24 h labs and trends, and last 24 h imaging results.  This care required moderate level of medical decision making.    Norm Parcel, [facility] 01/09/2022, 9:08 AM Please see Amion for pager number during day hours 7:00am-4:30pm

## 2022-01-09 NOTE — Progress Notes (Signed)
Trauma Event Note  TRN rounded on patient. A/ox4, pt stable at this time, VS WDL. Denies SHOB, reports pain is well controlled. No needs at this time.   Last imported Vital Signs BP 107/71 (BP Location: Left Arm)    Pulse 61    Temp (!) 97.5 F (36.4 C) (Oral)    Resp 18    Ht 5\' 1"  (1.549 m)    Wt 139 lb (63 kg)    LMP 06/19/2020 (Approximate)    SpO2 95%    BMI 26.26 kg/m   Trending CBC Recent Labs    01/08/22 1520 01/08/22 1528 01/09/22 0231  WBC 24.7*  --  9.9  HGB 13.0 14.3 11.7*  HCT 39.8 42.0 35.1*  PLT 292  --  256    Trending Coag's Recent Labs    01/08/22 1520  INR 1.0    Trending BMET Recent Labs    01/08/22 1520 01/08/22 1528 01/09/22 0231  NA 141 141 138  K 3.4* 3.5 3.5  CL 106 104 105  CO2 28  --  25  BUN 26* 30* 16  CREATININE 0.77 0.70 0.77  GLUCOSE 120* 114* 121*       O   Trauma Response RN  Please call TRN at [phone] for further assistance.

## 2022-01-09 NOTE — Evaluation (Signed)
Physical Therapy Evaluation Patient Details Name: Stephanie Gray MRN: [id number] DOB: 15-Jul-1966 Today's Date: 01/09/2022  History of Present Illness  56 yo F admitted s/p fall from Horse  R apical PTX, R 1-6, 8-9 rib fx's, R clavicle fx - ortho c/s, L thumb distal phalangeal fx.  Hypotension and bradycardia- possible vasovagal event in the ED.  Clinical Impression  Patient admitted with above diagnosis. Patient presents with generalized weakness, impaired balance, decreased activity tolerance, and pain. Patient functioning at min guard level with handheld support by husband per patient request. Patient with increased pain during coughing. Educated patient on bracing with pillow to reduce pain, patient verbalized understanding. Encouraged patient and husband to continue ambulating to improve activity tolerance. Patient will benefit from skilled PT services during acute stay to address listed deficits. No PT follow up recommended at this time.        Recommendations for follow up therapy are one component of a multi-disciplinary discharge planning process, led by the attending physician.  Recommendations may be updated based on patient status, additional functional criteria and insurance authorization.  Follow Up Recommendations No PT follow up    Assistance Recommended at Discharge Intermittent Supervision/Assistance  Patient can return home with the following  A little help with walking and/or transfers;Assistance with cooking/housework;Assist for transportation;A little help with bathing/dressing/bathroom    Equipment Recommendations None recommended by PT  Recommendations for Other Services       Functional Status Assessment Patient has had a recent decline in their functional status and demonstrates the ability to make significant improvements in function in a reasonable and predictable amount of time.     Precautions / Restrictions Precautions Precautions: Fall Precaution  Comments: Watch BP Restrictions Weight Bearing Restrictions: No Other Position/Activity Restrictions: Advised to restrict AROM R shoulder to 90*.  Limit pushing and pulling, sling for comfort and community use.  Patient lost thumb spica when transported from ED.      Mobility  Bed Mobility               General bed mobility comments: in recliner on arrival    Transfers Overall transfer level: Needs assistance Equipment used: 1 person hand held assist Transfers: Sit to/from Stand Sit to Stand: Min assist           General transfer comment: assist to stand from recliner.    Ambulation/Gait Ambulation/Gait assistance: Min guard Gait Distance (Feet): 200 Feet Assistive device: 1 person hand held assist Gait Pattern/deviations: Step-through pattern, Decreased stride length Gait velocity: decreased     General Gait Details: husband providing HHA support per patient request  Stairs            Wheelchair Mobility    Modified Rankin (Stroke Patients Only)       Balance Overall balance assessment: Mild deficits observed, not formally tested                                           Pertinent Vitals/Pain Pain Assessment Pain Assessment: Faces Faces Pain Scale: Hurts even more Pain Location: Ribs with cough Pain Descriptors / Indicators: Tender Pain Intervention(s): Monitored during session, Repositioned    Home Living Family/patient expects to be discharged to:: Private residence (parent's home) Living Arrangements: Spouse/significant other;Parent Available Help at Discharge: Family;Available 24 hours/day Type of Home: House Home Access: Ramped entrance       Home Layout:  One level Home Equipment: Shower seat;Grab bars - tub/shower;Hand held shower head      Prior Function Prior Level of Function : Independent/Modified Independent             Mobility Comments: Ind ADLs Comments: Ind     Hand Dominance   Dominant  Hand: Right    Extremity/Trunk Assessment   Upper Extremity Assessment Upper Extremity Assessment: Defer to OT evaluation RUE Deficits / Details: limit pushing/pulling and no AROM above 90* RUE Sensation: WNL RUE Coordination: WNL LUE Deficits / Details: Thumb fx LUE Sensation: WNL LUE Coordination: WNL    Lower Extremity Assessment Lower Extremity Assessment: Generalized weakness    Cervical / Trunk Assessment Cervical / Trunk Assessment: Normal  Communication   Communication: No difficulties  Cognition Arousal/Alertness: Awake/alert Behavior During Therapy: WFL for tasks assessed/performed Overall Cognitive Status: Within Functional Limits for tasks assessed                                          General Comments General comments (skin integrity, edema, etc.): VSS on RA    Exercises     Assessment/Plan    PT Assessment Patient needs continued PT services  PT Problem List Decreased strength;Decreased activity tolerance;Decreased balance;Decreased mobility       PT Treatment Interventions DME instruction;Gait training;Functional mobility training;Therapeutic activities;Therapeutic exercise;Balance training;Patient/family education    PT Goals (Current goals can be found in the Care Plan section)  Acute Rehab PT Goals Patient Stated Goal: to go home PT Goal Formulation: With patient Time For Goal Achievement: 01/23/22 Potential to Achieve Goals: Good    Frequency Min 3X/week     Co-evaluation               AM-PAC PT "6 Clicks" Mobility  Outcome Measure Help needed turning from your back to your side while in a flat bed without using bedrails?: A Little Help needed moving from lying on your back to sitting on the side of a flat bed without using bedrails?: A Little Help needed moving to and from a bed to a chair (including a wheelchair)?: A Little Help needed standing up from a chair using your arms (e.g., wheelchair or bedside  chair)?: A Little Help needed to walk in hospital room?: A Little Help needed climbing 3-5 steps with a railing? : A Little 6 Click Score: 18    End of Session   Activity Tolerance: Patient tolerated treatment well Patient left: in chair;with call bell/phone within reach;with family/visitor present Nurse Communication: Mobility status PT Visit Diagnosis: Unsteadiness on feet (R26.81);Muscle weakness (generalized) (M62.81)    Time: 4270-6237 PT Time Calculation (min) (ACUTE ONLY): 23 min   Charges:   PT Evaluation $PT Eval Low Complexity: 1 Low PT Treatments $Therapeutic Activity: 8-22 mins         A. Gilford Rile PT, DPT Acute Rehabilitation Services Pager [phone] Office [phone]   Linna Hoff 01/09/2022, 2:34 PM

## 2022-01-09 NOTE — Evaluation (Addendum)
Occupational Therapy Evaluation Patient Details Name: Stephanie Gray MRN: [id number] DOB: May 12, 1966 Today's Date: 01/09/2022   History of Present Illness 56 yo F admitted s/p fall from Horse  R apical PTX, R 1-6, 8-9 rib fx's, R clavicle fx - ortho c/s, L thumb distal phalangeal fx.  Hypotension and bradycardia- possible vasovagal event in the ED.   Clinical Impression   Patient admitted for the diagnosis above.  PTA she lives with her spouse, and needed no assist with any aspect of mobility, ADL/IADL.  Pain and soreness are th barrier.  BP dropped with movement but rebounded to 107/71 after 2 minutes.  She should progress quickly.  Instructed on shoulder rom to 90*, and elbow distal HEP.  OT will follow in the acute setting, no post acute OT is indicated.  She will initially d/c to her parents home, fully handicapped accessible.      Recommendations for follow up therapy are one component of a multi-disciplinary discharge planning process, led by the attending physician.  Recommendations may be updated based on patient status, additional functional criteria and insurance authorization.   Follow Up Recommendations  No OT follow up    Assistance Recommended at Discharge Intermittent Supervision/Assistance  Patient can return home with the following      Functional Status Assessment  Patient has had a recent decline in their functional status and demonstrates the ability to make significant improvements in function in a reasonable and predictable amount of time.  Equipment Recommendations  None recommended by OT    Recommendations for Other Services       Precautions / Restrictions Precautions Precautions: Fall Precaution Comments: Watch BP Restrictions Weight Bearing Restrictions: No Other Position/Activity Restrictions: Advised to restrict AROM R shoulder to 90*.  Limit pushing and pulling, sling for comfort and community use.  Patient lost thumb spica when transported from  ED.      Mobility Bed Mobility Overal bed mobility: Needs Assistance Bed Mobility: Sidelying to Sit                Transfers Overall transfer level: Needs assistance   Transfers: Sit to/from Stand, Bed to chair/wheelchair/BSC Sit to Stand: Min guard     Step pivot transfers: Min guard     General transfer comment: BP dropped 86/53 - symptomatic      Balance Overall balance assessment: Mild deficits observed, not formally tested                                         ADL either performed or assessed with clinical judgement   ADL                                         General ADL Comments: generalized min guard on her feet, Mod A with UB dressing, should progress quickly     Vision Patient Visual Report: No change from baseline       Perception Perception Perception: Not tested   Praxis Praxis Praxis: Not tested    Pertinent Vitals/Pain Pain Assessment Pain Assessment: Faces Faces Pain Scale: Hurts even more Pain Location: Ribs with cough and bed mobility Pain Descriptors / Indicators: Tender Pain Intervention(s): Monitored during session     Hand Dominance Right   Extremity/Trunk Assessment Upper Extremity Assessment Upper Extremity Assessment: RUE  deficits/detail;LUE deficits/detail RUE Deficits / Details: limit pushing/pulling and no AROM above 90* RUE Sensation: WNL RUE Coordination: WNL LUE Deficits / Details: Thumb fx LUE Sensation: WNL LUE Coordination: WNL   Lower Extremity Assessment Lower Extremity Assessment: Defer to PT evaluation   Cervical / Trunk Assessment Cervical / Trunk Assessment: Normal   Communication Communication Communication: No difficulties   Cognition Arousal/Alertness: Awake/alert Behavior During Therapy: WFL for tasks assessed/performed Overall Cognitive Status: Within Functional Limits for tasks assessed                                       General  Comments   2L O2 via [place]    Exercises     Shoulder Instructions      Home Living Family/patient expects to be discharged to:: Private residence (Parents home) Living Arrangements: Spouse/significant other;Parent Available Help at Discharge: Family;Available 24 hours/day Type of Home: House Home Access: Ramped entrance     Home Layout: One level     Bathroom Shower/Tub: Occupational psychologist: Handicapped height Bathroom Accessibility: Yes How Accessible: Accessible via wheelchair Home Equipment: Shower seat;Grab bars - tub/shower;Hand held shower head          Prior Functioning/Environment Prior Level of Function : Independent/Modified Independent             Mobility Comments: Ind ADLs Comments: Ind        OT Problem List: Impaired balance (sitting and/or standing);Pain;Impaired UE functional use      OT Treatment/Interventions: Self-care/ADL training;Balance training;Therapeutic activities    OT Goals(Current goals can be found in the care plan section) Acute Rehab OT Goals Patient Stated Goal: I'm going to my parents home first. OT Goal Formulation: With patient Time For Goal Achievement: 01/23/22 Potential to Achieve Goals: Good ADL Goals Pt Will Perform Grooming: Independently;standing Pt Will Perform Lower Body Bathing: Independently;sit to/from stand Pt Will Perform Lower Body Dressing: Independently;sit to/from stand Pt Will Transfer to Toilet: Independently;ambulating;regular height toilet  OT Frequency: Min 2X/week    Co-evaluation              AM-PAC OT "6 Clicks" Daily Activity     Outcome Measure Help from another person eating meals?: None Help from another person taking care of personal grooming?: None Help from another person toileting, which includes using toliet, bedpan, or urinal?: A Little Help from another person bathing (including washing, rinsing, drying)?: A Little Help from another person to put on and taking  off regular upper body clothing?: A Lot Help from another person to put on and taking off regular lower body clothing?: A Little 6 Click Score: 19   End of Session    Activity Tolerance: Patient tolerated treatment well Patient left: in chair;with call bell/phone within reach;with family/visitor present  OT Visit Diagnosis: Unsteadiness on feet (R26.81);Pain Pain - Right/Left: Right Pain - part of body: Arm                Time: 1030-1055 OT Time Calculation (min): 25 min Charges:  OT General Charges $OT Visit: 1 Visit OT Evaluation $OT Eval Moderate Complexity: 1 Mod OT Treatments $Therapeutic Activity: 8-22 mins  01/09/2022  RP, OTR/L  Acute Rehabilitation Services  Office:  [phone]   Metta Clines 01/09/2022, 11:10 AM

## 2022-01-10 ENCOUNTER — Inpatient Hospital Stay (HOSPITAL_COMMUNITY): Payer: BC Managed Care – PPO

## 2022-01-10 LAB — BASIC METABOLIC PANEL
Anion gap: 7 (ref 5–15)
BUN: 14 mg/dL (ref 6–20)
CO2: 26 mmol/L (ref 22–32)
Calcium: 8.8 mg/dL — ABNORMAL LOW (ref 8.9–10.3)
Chloride: 105 mmol/L (ref 98–111)
Creatinine, Ser: 0.65 mg/dL (ref 0.44–1.00)
GFR, Estimated: >60 mL/min (ref >60)
Glucose, Bld: 119 mg/dL — ABNORMAL HIGH (ref 70–99)
Potassium: 4.1 mmol/L (ref 3.5–5.1)
Sodium: 138 mmol/L (ref 135–145)

## 2022-01-10 LAB — CBC
HCT: 35.6 % — ABNORMAL LOW (ref 36.0–46.0)
Hemoglobin: 11.5 g/dL — ABNORMAL LOW (ref 12.0–15.0)
MCH: 28.3 pg (ref 26.0–34.0)
MCHC: 32.3 g/dL (ref 30.0–36.0)
MCV: 87.7 fL (ref 80.0–100.0)
Platelets: 236 10*3/uL (ref 150–400)
RBC: 4.06 MIL/uL (ref 3.87–5.11)
RDW: 12.9 % (ref 11.5–15.5)
WBC: 7.4 10*3/uL (ref 4.0–10.5)
nRBC: 0 % (ref 0.0–0.2)

## 2022-01-10 MED ORDER — TRAMADOL HCL 50 MG PO TABS
50.0000 mg | ORAL_TABLET | Freq: Four times a day (QID) | ORAL | Status: DC | PRN
  Administered 2022-01-10 – 2022-01-11 (×3): 100 mg via ORAL
  Filled 2022-01-10 (×3): qty 2

## 2022-01-10 MED ORDER — POLYETHYLENE GLYCOL 3350 17 G PO PACK: 17.0000 g | PACK | Freq: Every day | ORAL | Status: DC | PRN

## 2022-01-10 MED ORDER — SODIUM CHLORIDE 0.9 % IV SOLN: 250.0000 mL | INTRAVENOUS | Status: DC | PRN

## 2022-01-10 MED ORDER — SODIUM CHLORIDE 0.9% FLUSH: 3.0000 mL | INTRAVENOUS | Status: DC | PRN

## 2022-01-10 MED ORDER — METHOCARBAMOL 500 MG PO TABS
1000.0000 mg | ORAL_TABLET | Freq: Four times a day (QID) | ORAL | Status: DC
  Administered 2022-01-10 – 2022-01-11 (×5): 1000 mg via ORAL
  Filled 2022-01-10 (×5): qty 2

## 2022-01-10 MED ORDER — IBUPROFEN 200 MG PO TABS
600.0000 mg | ORAL_TABLET | Freq: Three times a day (TID) | ORAL | Status: DC
  Administered 2022-01-10 – 2022-01-11 (×4): 600 mg via ORAL
  Filled 2022-01-10 (×4): qty 3

## 2022-01-10 MED ORDER — SODIUM CHLORIDE 0.9% FLUSH
3.0000 mL | Freq: Two times a day (BID) | INTRAVENOUS | Status: DC
  Administered 2022-01-10 (×2): 3 mL via INTRAVENOUS

## 2022-01-10 MED ORDER — KETOROLAC TROMETHAMINE 10 MG PO TABS: 10.0000 mg | ORAL_TABLET | Freq: Three times a day (TID) | ORAL | Status: DC | PRN

## 2022-01-10 NOTE — TOC Progression Note (Signed)
Transition of Care [facility]) - Progression Note    Patient Details  Name: Stephanie Gray MRN: [id number] Date of Birth: 12-08-1966  Transition of Care [facility]) CM/SW Contact  Zenon Mayo, RN Phone Number: 01/10/2022, 1:44 PM  Clinical Narrative:     Transition of Care (TOC) Screening Note   Patient Details  Name: Stephanie Gray Date of Birth: Jan 09, 1966   Transition of Care [facility]) CM/SW Contact:    Zenon Mayo, RN Phone Number: 01/10/2022, 1:44 PM    Transition of Care Department [facility]) has reviewed patient and no TOC needs have been identified at this time. We will continue to monitor patient advancement through interdisciplinary progression rounds. If new patient transition needs arise, please place a TOC consult.          Expected Discharge Plan and Services                                                 Social Determinants of Health (SDOH) Interventions    Readmission Risk Interventions No flowsheet data found.

## 2022-01-10 NOTE — Progress Notes (Signed)
Progress Note     Subjective: Patient mobilized well with therapies yesterday. Reports n/v with oxycodone. Pain still present but maybe slightly better from yesterday. Denies SOB.   Objective: Vital signs in last 24 hours: Temp:  [97.5 F (36.4 C)-98.8 F (37.1 C)] 97.7 F (36.5 C) (01/22 0324) Pulse Rate:  [60-70] 60 (01/22 0324) Resp:  [13-18] 13 (01/22 0324) BP: (104-113)/(67-71) 106/70 (01/22 0324) SpO2:  [95 %-98 %] 95 % (01/21 1553) Last BM Date: 01/08/22  Intake/Output from previous day: 01/21 0701 - 01/22 0700 In: 240 [P.O.:240] Out: 900 [Urine:900] Intake/Output this shift: No intake/output data recorded.  PE: General: pleasant, WD, WN female who is laying in bed and appears uncomfortable HEENT: head is normocephalic, atraumatic.  Sclera are noninjected.   Heart: regular, rate, and rhythm. Palpable radial and pedal pulses bilaterally Lungs: CTAB, no wheezes, rhonchi, or rales noted.  Respiratory effort nonlabored Abd: soft, NT, ND MS: RUE in sling with mild edema over right shoulder, ROM intact at R wrist and R hand NVI. LUE normal. BLE normal  Skin: warm and dry with no masses, lesions, or rashes Neuro: Cranial nerves 2-12 grossly intact, sensation is normal throughout Psych: A&Ox3 with an appropriate affect.    Lab Results:  Recent Labs    01/09/22 0231 01/10/22 0336  WBC 9.9 7.4  HGB 11.7* 11.5*  HCT 35.1* 35.6*  PLT 256 236   BMET Recent Labs    01/09/22 0231 01/10/22 0336  NA 138 138  K 3.5 4.1  CL 105 105  CO2 25 26  GLUCOSE 121* 119*  BUN 16 14  CREATININE 0.77 0.65  CALCIUM 8.7* 8.8*   PT/INR Recent Labs    01/08/22 1520  LABPROT 13.1  INR 1.0   CMP     Component Value Date/Time   NA 138 01/10/2022 0336   K 4.1 01/10/2022 0336   CL 105 01/10/2022 0336   CO2 26 01/10/2022 0336   GLUCOSE 119 (H) 01/10/2022 0336   BUN 14 01/10/2022 0336   CREATININE 0.65 01/10/2022 0336   CALCIUM 8.8 (L) 01/10/2022 0336   PROT 6.6  01/08/2022 1520   ALBUMIN 4.0 01/08/2022 1520   AST 71 (H) 01/08/2022 1520   ALT 50 (H) 01/08/2022 1520   ALKPHOS 68 01/08/2022 1520   BILITOT 0.3 01/08/2022 1520   GFRNONAA >60 01/10/2022 0336   GFRAA >60 07/11/2020 0612   Lipase     Component Value Date/Time   LIPASE 48 07/11/2020 0612       Studies/Results: CT HEAD WO CONTRAST  Result Date: 01/08/2022 CLINICAL DATA:  Fall from horse.  Right chest pain. EXAM: CT HEAD WITHOUT CONTRAST CT CERVICAL SPINE WITHOUT CONTRAST TECHNIQUE: Multidetector CT imaging of the head and cervical spine was performed following the standard protocol without intravenous contrast. Multiplanar CT image reconstructions of the cervical spine were also generated. RADIATION DOSE REDUCTION: This exam was performed according to the departmental dose-optimization program which includes automated exposure control, adjustment of the mA and/or kV according to patient size and/or use of iterative reconstruction technique. COMPARISON:  None. FINDINGS: CT HEAD FINDINGS Brain: The brainstem, cerebellum, cerebral peduncles, thalami, basal ganglia, basilar cisterns, and ventricular system appear within normal limits. No intracranial hemorrhage, mass lesion, or acute CVA. Vascular: Unremarkable Skull: Unremarkable Sinuses/Orbits: Chronic left sphenoid sinusitis. Oval-shaped 1.1 by 0.7 cm left frontal sinus lesion along the anterior wall, appearance favoring either a ground-glass density osteoma or small focus of fibrous dysplasia. Other: No supplemental non-categorized findings.  CT CERVICAL SPINE FINDINGS Alignment: 1.5 mm degenerative retrolisthesis at C5-6. Dextroconvex cervical scoliosis which may be positional. Skull base and vertebrae: No fracture or acute bony findings identified. Soft tissues and spinal canal: Gas tracking along the right strap musculature related to the known right pneumothorax. Disc levels: Borderline right foraminal stenosis at C5-6 due to intervertebral  and facet spurring. Loss of disc height at C5-6 due to degenerative disc disease. Substantial degenerative facet arthropathy on the right at C4-5. Upper chest: Pneumothorax visualized at the right lung apex, as shown on recent chest CT. Other: No supplemental non-categorized findings. IMPRESSION: 1. No acute intracranial findings are acute cervical spine findings. 2. Cervical spondylosis and degenerative disc disease. 3. Known right-sided pneumothorax with a small amount of gas tracking along the right strap musculature. 4. Chronic left sphenoid sinusitis. 5. Oval-shaped ground-glass density lesion along the anterior wall the left frontal sinus, appearance favoring either a low-density osteoma or a small focus of fibrous dysplasia. Electronically Signed   By: Van Clines M.D.   On: 01/08/2022 16:56   CT CERVICAL SPINE WO CONTRAST  Result Date: 01/08/2022 CLINICAL DATA:  Fall from horse.  Right chest pain. EXAM: CT HEAD WITHOUT CONTRAST CT CERVICAL SPINE WITHOUT CONTRAST TECHNIQUE: Multidetector CT imaging of the head and cervical spine was performed following the standard protocol without intravenous contrast. Multiplanar CT image reconstructions of the cervical spine were also generated. RADIATION DOSE REDUCTION: This exam was performed according to the departmental dose-optimization program which includes automated exposure control, adjustment of the mA and/or kV according to patient size and/or use of iterative reconstruction technique. COMPARISON:  None. FINDINGS: CT HEAD FINDINGS Brain: The brainstem, cerebellum, cerebral peduncles, thalami, basal ganglia, basilar cisterns, and ventricular system appear within normal limits. No intracranial hemorrhage, mass lesion, or acute CVA. Vascular: Unremarkable Skull: Unremarkable Sinuses/Orbits: Chronic left sphenoid sinusitis. Oval-shaped 1.1 by 0.7 cm left frontal sinus lesion along the anterior wall, appearance favoring either a ground-glass density  osteoma or small focus of fibrous dysplasia. Other: No supplemental non-categorized findings. CT CERVICAL SPINE FINDINGS Alignment: 1.5 mm degenerative retrolisthesis at C5-6. Dextroconvex cervical scoliosis which may be positional. Skull base and vertebrae: No fracture or acute bony findings identified. Soft tissues and spinal canal: Gas tracking along the right strap musculature related to the known right pneumothorax. Disc levels: Borderline right foraminal stenosis at C5-6 due to intervertebral and facet spurring. Loss of disc height at C5-6 due to degenerative disc disease. Substantial degenerative facet arthropathy on the right at C4-5. Upper chest: Pneumothorax visualized at the right lung apex, as shown on recent chest CT. Other: No supplemental non-categorized findings. IMPRESSION: 1. No acute intracranial findings are acute cervical spine findings. 2. Cervical spondylosis and degenerative disc disease. 3. Known right-sided pneumothorax with a small amount of gas tracking along the right strap musculature. 4. Chronic left sphenoid sinusitis. 5. Oval-shaped ground-glass density lesion along the anterior wall the left frontal sinus, appearance favoring either a low-density osteoma or a small focus of fibrous dysplasia. Electronically Signed   By: Van Clines M.D.   On: 01/08/2022 16:56   CT CHEST ABDOMEN PELVIS W CONTRAST  Result Date: 01/08/2022 CLINICAL DATA:  Fall from a horse.  Head and body trauma. EXAM: CT CHEST, ABDOMEN, AND PELVIS WITH CONTRAST TECHNIQUE: Multidetector CT imaging of the chest, abdomen and pelvis was performed following the standard protocol during bolus administration of intravenous contrast. RADIATION DOSE REDUCTION: This exam was performed according to the departmental dose-optimization program which includes automated  exposure control, adjustment of the mA and/or kV according to patient size and/or use of iterative reconstruction technique. CONTRAST:  132mL OMNIPAQUE  IOHEXOL 350 MG/ML SOLN COMPARISON:  Chest radiograph 01/08/2022 and CT examinations from 07/11/2020 10/21/2021 FINDINGS: CT CHEST FINDINGS Cardiovascular: Upper normal heart size. No acute vascular findings. Mediastinum/Nodes: No substantial mediastinal hematoma. The esophagus appears unremarkable. Lungs/Pleura: Proximally 10% right pneumothorax. Trace amount of right pleural fluid, making this a hydropneumothorax. Dependent density in the right lung is primarily from atelectasis although a component of mild contusion in the right lower lobe cannot be readily excluded. Small amount of gas tracks in the soft tissues of the right chest wall and up along the right strap muscles. Linear subsegmental atelectasis in the lingula and left lower lobe. Musculoskeletal: Mildly comminuted midclavicular fracture on image 13 of series 4. Old well corticated medial clavicular head ossicle on image 26 series 4. Acute fractures the right first, second, third, fourth, fifth, sixth, eighth, and ninth ribs noted the second rib fracture may be minimally comminuted and the fourth and fifth rib fractures are minimally displaced. The third, fourth, and fifth rib fractures are segmental. CT ABDOMEN PELVIS FINDINGS Hepatobiliary: Two fluid density right hepatic lobe cysts are present. Prior cholecystectomy. Common bile duct mildly prominent at 0.8 cm diameter, although this may be a physiologic response to cholecystectomy. No perihepatic ascites or well-defined hepatic laceration identified. Pancreas: Unremarkable Spleen: Unremarkable Adrenals/Urinary Tract: Unremarkable Stomach/Bowel: Unremarkable Vascular/Lymphatic: Minimal aortic atherosclerotic calcification. Reproductive: Unremarkable Other: No supplemental non-categorized findings. Musculoskeletal: Small umbilical hernia contains adipose tissue. Edema along the right flank potentially from bruising, image 68 series 3. IMPRESSION: 1. Acute fractures of the right first through sixth ribs  as well as the eighth and ninth ribs, with segmental fractures at the third, fourth, and fifth ribs. Mildly comminuted fracture the right midclavicle. 2. Approximally 10% right pneumothorax with trace right pleural effusion. Dependent airspace opacity in the right lung is primarily atelectatic although there could be a component of pulmonary contusion. 3. Other imaging findings of potential clinical significance: Trace abdominal aortic atherosclerotic calcification. Small umbilical hernia contains adipose tissue. Probable bruising in the subcutaneous tissues of the right flank. I reviewed these findings in person at the workstation with the trauma team at the time of interpretation on 01/08/2022 at 4:10 pm, including Dr. Georganna Skeans, who verbally acknowledged these results. Electronically Signed   By: Van Clines M.D.   On: 01/08/2022 16:27   DG CHEST PORT 1 VIEW  Result Date: 01/10/2022 CLINICAL DATA:  Chest pain.  Evaluate for pneumothorax. EXAM: PORTABLE CHEST 1 VIEW COMPARISON:  01/09/2022 FINDINGS: Heart size and mediastinal contours are unremarkable. Interval decrease in lung volumes. The previous right apical pneumothorax is no longer visualized. There is new atelectasis within the right base. New small left pleural effusions suspected.Right posterior rib fracture deformities are unchanged. Mid shaft of right clavicle fracture is again noted IMPRESSION: 1. New right base atelectasis. 2. New small left pleural effusion. 3. Right apical pneumothorax no longer visualized. Electronically Signed   By: Kerby Moors M.D.   On: 01/10/2022 08:31   DG Chest Port 1 View  Result Date: 01/09/2022 CLINICAL DATA:  Chest pain, pneumothorax follow-up EXAM: PORTABLE CHEST 1 VIEW COMPARISON:  Chest x-ray 01/08/2022 FINDINGS: Heart size is normal. Mediastinum appears stable. No focal consolidation or significant pleural effusion visualized. Stable small right apical pneumothorax. Redemonstration of multiple  right-sided rib fractures and right clavicle fracture. Small amount of subcutaneous emphysema again seen in the right  upper chest. IMPRESSION: No significant change in the small right pneumothorax. Electronically Signed   By: Ofilia Neas M.D.   On: 01/09/2022 09:06   DG Chest Port 1 View  Result Date: 01/08/2022 CLINICAL DATA:  Thrown from horse. EXAM: PORTABLE CHEST 1 VIEW COMPARISON:  07/11/2020 FINDINGS: 1425 hours. Small right apical pneumothorax evident with acute fracture noted in the right second, third, and fourth ribs. No substantial pleural effusion. Left lung clear The cardiopericardial silhouette is within normal limits for size. Possible mid right clavicle fracture. Telemetry leads overlie the chest. IMPRESSION: 1. Small right apical pneumothorax with fractures of the right second, third and fourth ribs. 2. Possible mid right clavicle fracture. Electronically Signed   By: Misty Stanley M.D.   On: 01/08/2022 14:39   DG Finger Thumb Left  Result Date: 01/08/2022 CLINICAL DATA:  Trauma and pain. EXAM: LEFT THUMB 2+V COMPARISON:  None. FINDINGS: Transverse fracture involving the proximal portion of the distal phalanx of the thumb. Minimal displacement of distal fracture fragment volarly. No intra-articular extension. Overlying soft tissue swelling. IMPRESSION: Distal phalangeal fracture of the thumb. Electronically Signed   By: Abigail Miyamoto M.D.   On: 01/08/2022 14:39    Anti-infectives: Anti-infectives (From admission, onward)    None        Assessment/Plan Fall From Horse R apical PTX - currently oxygenating on 2L [place], CXR with resolution in R apical PTX R 1-6, 8-9 rib fx's - multimodal pain control, patient states she is sensitive to medications, will keep this in mind when dosing narcotics R clavicle fx - ortho c/s; sling and OP follow up per Dr. Mable Fill L thumb distal phalangeal fx - Hand consulted by Dr. Tyrone Nine, splinted Hypotension and bradycardia - resolved in ED with IV  fluids, rhythm has been normal, 1 episode of orthostasis with OT but none since    FEN - Regular, SLIV VTE - SCD's, Lovenox ID - None  Foley - None   Dispo - Transition from oxycodone to tramadol and see if patient tolerates this better. Added scheduled motrin. Likely home tomorrow AM. Wean oxygen   LOS: 2 days   I reviewed last 24 h vitals and pain scores, last 48 h intake and output, last 24 h labs and trends, last 24 h imaging results, and therapy notes .  This care required low level of medical decision making.    Norm Parcel, [facility] 01/10/2022, 8:42 AM Please see Amion for pager number during day hours 7:00am-4:30pm

## 2022-01-11 ENCOUNTER — Other Ambulatory Visit: Payer: Self-pay | Admitting: Family

## 2022-01-11 LAB — BASIC METABOLIC PANEL
Anion gap: 8 (ref 5–15)
BUN: 14 mg/dL (ref 6–20)
CO2: 28 mmol/L (ref 22–32)
Calcium: 8.9 mg/dL (ref 8.9–10.3)
Chloride: 101 mmol/L (ref 98–111)
Creatinine, Ser: 0.76 mg/dL (ref 0.44–1.00)
GFR, Estimated: >60 mL/min (ref >60)
Glucose, Bld: 96 mg/dL (ref 70–99)
Potassium: 4 mmol/L (ref 3.5–5.1)
Sodium: 137 mmol/L (ref 135–145)

## 2022-01-11 MED ORDER — LIDOCAINE 5 % EX PTCH: 1.0000 | MEDICATED_PATCH | CUTANEOUS | 0 refills | Status: DC

## 2022-01-11 MED ORDER — TRAMADOL HCL 50 MG PO TABS: 50.0000 mg | ORAL_TABLET | Freq: Four times a day (QID) | ORAL | 0 refills | Status: DC | PRN

## 2022-01-11 MED ORDER — METHOCARBAMOL 1000 MG PO TABS: 1000.0000 mg | ORAL_TABLET | Freq: Four times a day (QID) | ORAL | 0 refills | Status: DC | PRN

## 2022-01-11 MED ORDER — METHOCARBAMOL 500 MG PO TABS: 1000.0000 mg | ORAL_TABLET | Freq: Four times a day (QID) | ORAL | 0 refills | Status: DC | PRN

## 2022-01-11 MED ORDER — ACETAMINOPHEN 500 MG PO TABS: 1000.0000 mg | ORAL_TABLET | Freq: Four times a day (QID) | ORAL | Status: AC | PRN

## 2022-01-11 MED ORDER — IBUPROFEN 600 MG PO TABS: 600.0000 mg | ORAL_TABLET | Freq: Three times a day (TID) | ORAL | 0 refills | Status: DC

## 2022-01-11 MED ORDER — POLYETHYLENE GLYCOL 3350 17 G PO PACK: 17.0000 g | PACK | Freq: Every day | ORAL | Status: AC | PRN

## 2022-01-11 MED ORDER — DOCUSATE SODIUM 100 MG PO CAPS: 100.0000 mg | ORAL_CAPSULE | Freq: Every day | ORAL | Status: DC | PRN

## 2022-01-11 NOTE — Discharge Summary (Addendum)
Physician Discharge Summary  Patient ID: Stephanie Gray MRN: [id number] DOB/AGE: [birth year] 56 y.o.  Admit date: 01/08/2022 Discharge date: 01/11/2022  Discharge Diagnoses Fall from horse Right apical pneumothorax Right 1-6 and 8-9 rib fractures Right clavicle fracture Left thumb distal phalangeal fracture Hypotension and bradycardia, resolved  Consultants Orthopedic surgery  Hand surgery   Procedures None   HPI: Patient is a 56 y.o. female who presented to the ED after falling from horse. Patient was horseback riding when she fell backwards off the horse, landing on concrete and striking her head. She was wearing a helmet. No LOC. She complained of right upper back/rib pain and shortness of breath due to pain with inspiration. FAST negative. Chest x-ray done in the trauma bay showed right apical pneumothorax with R rib fx's, possible R clavicle fx. She also had a L thumb xray done that showed a distal phalangeal fx. Trauma was called for admission. During initial exam the patient developed an episode of light-headedness, bradycardia with HR 49-53, followed by hypotension with a systolic BP 50'Y. Pt was upgraded to a level 1 trauma for hypotension. Patient's husband at bedside stated patient is very sensitive to medications. Has had an elective surgery cancelled in the past because she was given a benzo before anesthesia and the code team head to be called as a precaution due to her sxs.  Hospital Course: Patient admitted and placed on tele monitoring. Orthopedic surgery consulted and recommended non-operative management with sling and outpatient follow up. Hand surgery consulted by ED provider and recommended splint and outpatient follow up. Follow up CXR 1/21 showed stable right apical PTX, patient kept on supplemental oxygen and follow up film 1/22 showed resolution in R PTX. Patient was evaluated by therapies and no follow up or equipment was recommended. Patient had no further  episodes of hypotension or bradycardia. Home antihypertensives were held and BP was normal, recommended patient continue to hold these medications at home and follow up with PCP.  On 01/11/22 patient was tolerating a diet, voiding appropriately, VSS, pain well controlled and overall felt stable for discharge home. Follow up as outlined below.   PE: General: pleasant, WD, WN female who is laying in bed and appears uncomfortable HEENT: head is normocephalic, atraumatic.  Sclera are noninjected.   Heart: regular, rate, and rhythm. Palpable radial and pedal pulses bilaterally Lungs: CTAB, no wheezes, rhonchi, or rales noted.  Respiratory effort nonlabored Abd: soft, NT, ND MS: RUE in sling with mild edema over right shoulder, ROM intact at R wrist and R hand NVI. LUE normal. BLE normal  Skin: warm and dry with no masses, lesions, or rashes Neuro: Cranial nerves 2-12 grossly intact, sensation is normal throughout Psych: A&Ox3 with an appropriate affect.   I or a member of my team have reviewed this patient in the Controlled Substance Database   Allergies as of 01/11/2022       Reactions   Benzodiazepines Other (See Comments)   Patient needed crash cart after tubal ligation   Other Other (See Comments)   Patient is extremely sensitive to all medicines, maybe needs 1/4 or 1/8 dosage of normal adult   Augmentin [amoxicillin-pot Clavulanate] Other (See Comments)   Lamictal [lamotrigine] Rash   Penicillins Rash   Rash because of a history of documented adverse serious drug reaction;Medi Alert bracelet  is recommended (CHILDHOOD REACTION) Has patient had a PCN reaction causing immediate rash, facial/tongue/throat swelling, SOB or lightheadedness with hypotension:UNKNOWN Has patient had a PCN reaction causing severe  rash involving mucus membranes or skin necrosis:unsure Has patient had a PCN reaction that required hospitalization:unsure Has patient had a PCN reaction occurring within the last 10  years:unsure        Medication List     STOP taking these medications    amLODipine 5 MG tablet Commonly known as: NORVASC   valsartan 160 MG tablet Commonly known as: DIOVAN       TAKE these medications    acetaminophen 500 MG tablet Commonly known as: TYLENOL Take 2 tablets (1,000 mg total) by mouth every 6 (six) hours as needed for mild pain or fever.   albuterol 108 (90 Base) MCG/ACT inhaler Commonly known as: VENTOLIN HFA Inhale 2 puffs into the lungs every 6 (six) hours as needed for wheezing or shortness of breath.   BLUE-EMU HEMP EX Take 1 tablet by mouth daily. Hemp muscadine   docusate sodium 100 MG capsule Commonly known as: COLACE Take 1 capsule (100 mg total) by mouth daily as needed for mild constipation.   DULoxetine 60 MG capsule Commonly known as: CYMBALTA Take 1 capsule (60 mg total) by mouth daily.   ibuprofen 600 MG tablet Commonly known as: ADVIL Take 1 tablet (600 mg total) by mouth 3 (three) times daily.   lidocaine 5 % Commonly known as: LIDODERM Place 1 patch onto the skin daily. Remove & Discard patch within 12 hours or as directed by MD   Methocarbamol 1000 MG Tabs Take 1,000 mg by mouth every 6 (six) hours as needed for muscle spasms.   polyethylene glycol 17 g packet Commonly known as: MIRALAX / GLYCOLAX Take 17 g by mouth daily as needed for mild constipation.   QC TUMERIC COMPLEX PO Take 1 tablet by mouth daily.   traMADol 50 MG tablet Commonly known as: ULTRAM Take 1-2 tablets (50-100 mg total) by mouth every 6 (six) hours as needed for moderate pain or severe pain (50 mg for moderate pain, 100 mg for severe pain).          Follow-up Information     Marrian Salvage, FNP. Schedule an appointment as soon as possible for a visit in 1 week(s).   Specialty: Internal Medicine Why: For further pain control related to rib fractures. Can also ask about resuming antihypertensive meds. Contact information: [place] [address] [phone]         Georgeanna Harrison, MD. Schedule an appointment as soon as possible for a visit in 1 week(s).   Specialty: Orthopedic Surgery Why: To follow up for clavicle fracture. Contact information: [place] [address] [phone]         [facility].   Why: You can also follow up with previous orthopedic surgeon for clavicle fracture if preferred. Contact information: [place] [address] [phone]         Leanora Cover, MD. Schedule an appointment as soon as possible for a visit in 1 week(s).   Specialty: Orthopedic Surgery Why: For follow up of left thumb fracture, also ok to follow up with hand surgeon in your previous ortho practice if you prefer. Contact information: [place] [address] [phone]                 Signed: Norm Parcel , [facility] Surgery 01/11/2022, 8:22 AM Please see Amion for pager number during day hours 7:00am-4:30pm

## 2022-01-11 NOTE — Progress Notes (Signed)
D/C education given to Pt and all questions answered. No printed prescriptions to give or equipment to deliver. IVs removed. Pt taken to car with all belongings.   ?

## 2022-01-11 NOTE — Progress Notes (Signed)
Orthopedic Tech Progress Note Patient Details:  Stephanie Gray 1966/06/21 [id number]  Ortho Devices Type of Ortho Device: Finger splint Ortho Device/Splint Location: LUE Ortho Device/Splint Interventions: Ordered, Application, Adjustment   Post Interventions Patient Tolerated: Well Instructions Provided: Care of device  Janit Pagan 01/11/2022, 9:27 AM

## 2022-01-11 NOTE — Progress Notes (Signed)
PT Cancellation Note  Patient Details Name: Stephanie Gray MRN: [id number] DOB: 15-May-1966   Cancelled Treatment:    Reason Eval/Treat Not Completed: Other (comment) (pt in transport chair for D/C and denied any further needs at this time)    B  01/11/2022, 10:27 AM Bayard Males, PT Acute Rehabilitation Services Pager: [phone] Office: [phone]

## 2022-01-13 ENCOUNTER — Telehealth: Payer: BC Managed Care – PPO | Admitting: Physician Assistant

## 2022-01-13 DIAGNOSIS — K5903 Drug induced constipation: Secondary | ICD-10-CM | POA: Diagnosis not present

## 2022-01-13 DIAGNOSIS — T402X5A Adverse effect of other opioids, initial encounter: Secondary | ICD-10-CM

## 2022-01-13 NOTE — Progress Notes (Signed)
Virtual Visit Consent   Stephanie Gray, you are scheduled for a virtual visit with a [facility] provider today.     Just as with appointments in the office, your consent must be obtained to participate.  Your consent will be active for this visit and any virtual visit you may have with one of our providers in the next 365 days.     If you have a MyChart account, a copy of this consent can be sent to you electronically.  All virtual visits are billed to your insurance company just like a traditional visit in the office.    As this is a virtual visit, video technology does not allow for your provider to perform a traditional examination.  This may limit your provider's ability to fully assess your condition.  If your provider identifies any concerns that need to be evaluated in person or the need to arrange testing (such as labs, EKG, etc.), we will make arrangements to do so.     Although advances in technology are sophisticated, we cannot ensure that it will always work on either your end or our end.  If the connection with a video visit is poor, the visit may have to be switched to a telephone visit.  With either a video or telephone visit, we are not always able to ensure that we have a secure connection.     I need to obtain your verbal consent now.   Are you willing to proceed with your visit today?    Stephanie Gray has provided verbal consent on 01/13/2022 for a virtual visit (video or telephone).   Stephanie Gray, [place]   Date: 01/13/2022 1:14 PM   Virtual Visit via Video Note   I, Stephanie Gray, connected with  Stephanie Gray  ([id number], 21-Jan-1966) on 01/13/22 at  1:15 PM EST by a video-enabled telemedicine application and verified that I am speaking with the correct person using two identifiers.  Location: Patient: Virtual Visit Location Patient: Home Provider: Virtual Visit Location Provider: Home Office   I discussed the limitations of evaluation  and management by telemedicine and the availability of in person appointments. The patient expressed understanding and agreed to proceed.    History of Present Illness: Stephanie Gray is a 56 y.o. who identifies as a female who was assigned female at birth, and is being seen today for opioid-induced constipation. Recently had a fall from a horse with multiple fractures. Was started on pain medication and soon after noting constipation. Has not had a BM in about 5 days. Notes abdominal discomfort. No nausea or vomiting.  Has stopped all of her pain medicine. Is increasing fiber and fluid intake.    HPI: HPI  Problems:  Patient Active Problem List   Diagnosis Date Noted   Multiple fractures of ribs, right side, initial encounter for closed fracture 01/08/2022   PVC's (premature ventricular contractions)    Atrial tachycardia, paroxysmal (HCC)    Family history of premature CAD 11/21/2019   Family history of bicuspid aortic valve 11/21/2019   Hypothyroidism 03/17/2018   Menorrhagia 03/17/2018   Perimenopausal disorder 03/17/2018   Lightheadedness 03/17/2018   Neuromuscular disorder ([place])    Depression    Arthritis    Anxiety    Apocrine metaplasia of breast, right 03/03/2018   Breast mass, right 04/12/2017   Hypertension 06/28/2016   Routine general medical examination at a health care facility 04/06/2016   Elevated blood pressure reading without diagnosis of  hypertension 06/12/2015   Tubular adenoma of colon 04/27/2015   Nonspecific abnormal results of thyroid function study 04/14/2009   DEPRESSION 04/14/2009   NONSPECIFIC ABNORMAL ELECTROCARDIOGRAM 04/14/2009    Allergies:  Allergies  Allergen Reactions   Benzodiazepines Other (See Comments)    Patient needed crash cart after tubal ligation   Other Other (See Comments)    Patient is extremely sensitive to all medicines, maybe needs 1/4 or 1/8 dosage of normal adult   Augmentin [Amoxicillin-Pot Clavulanate] Other (See  Comments)   Lamictal [Lamotrigine] Rash   Penicillins Rash    Rash because of a history of documented adverse serious drug reaction;Medi Alert bracelet  is recommended (CHILDHOOD REACTION) Has patient had a PCN reaction causing immediate rash, facial/tongue/throat swelling, SOB or lightheadedness with hypotension:UNKNOWN Has patient had a PCN reaction causing severe rash involving mucus membranes or skin necrosis:unsure Has patient had a PCN reaction that required hospitalization:unsure Has patient had a PCN reaction occurring within the last 10 years:unsure    Medications:  Current Outpatient Medications:    acetaminophen (TYLENOL) 500 MG tablet, Take 2 tablets (1,000 mg total) by mouth every 6 (six) hours as needed for mild pain or fever., Disp: , Rfl:    albuterol (VENTOLIN HFA) 108 (90 Base) MCG/ACT inhaler, Inhale 2 puffs into the lungs every 6 (six) hours as needed for wheezing or shortness of breath., Disp: 8 g, Rfl: 0   docusate sodium (COLACE) 100 MG capsule, Take 1 capsule (100 mg total) by mouth daily as needed for mild constipation., Disp: , Rfl:    DULoxetine (CYMBALTA) 60 MG capsule, Take 1 capsule (60 mg total) by mouth daily., Disp: 90 capsule, Rfl: 1   ibuprofen (ADVIL) 600 MG tablet, Take 1 tablet (600 mg total) by mouth 3 (three) times daily., Disp: 30 tablet, Rfl: 0   lidocaine (LIDODERM) 5 %, Place 1 patch onto the skin daily. Remove & Discard patch within 12 hours or as directed by MD, Disp: 30 patch, Rfl: 0   methocarbamol (ROBAXIN) 500 MG tablet, Take 2 tablets (1,000 mg total) by mouth every 6 (six) hours as needed for muscle spasms., Disp: 60 tablet, Rfl: 0   polyethylene glycol (MIRALAX / GLYCOLAX) 17 g packet, Take 17 g by mouth daily as needed for mild constipation., Disp: , Rfl:    traMADol (ULTRAM) 50 MG tablet, Take 1-2 tablets (50-100 mg total) by mouth every 6 (six) hours as needed for moderate pain or severe pain (50 mg for moderate pain, 100 mg for severe  pain)., Disp: 30 tablet, Rfl: 0   Trolamine Salicylate (BLUE-EMU HEMP EX), Take 1 tablet by mouth daily. Hemp muscadine, Disp: , Rfl:    Turmeric (QC TUMERIC COMPLEX PO), Take 1 tablet by mouth daily., Disp: , Rfl:   Observations/Objective: Patient is well-developed, well-nourished in no acute distress.  Resting comfortably at home.  Head is normocephalic, atraumatic.  No labored breathing. Speech is clear and coherent with logical content.  Patient is alert and oriented at baseline.    Assessment and Plan: 1. Therapeutic opioid induced constipation  Has been able to stop medications, even Tramadol. Supportive measures and full bowel regimen reviewed with patient as noted below. Strict ER precautions discussed.    "Start a daily probiotic (Align, Culturelle, Digestive Advantage, etc.). Take 2 Tbs of Milk of Magnesia in a 4 oz glass of warmed prune juice every 2-3 days to help promote bowel movement and to keep more regular over the next week.  I would  go ahead and just add a Dulcolax stool softener to regimen. If this does not promote a bowel movement, please be evaluated in person"  Follow Up Instructions: I discussed the assessment and treatment plan with the patient. The patient was provided an opportunity to ask questions and all were answered. The patient agreed with the plan and demonstrated an understanding of the instructions.  A copy of instructions were sent to the patient via MyChart unless otherwise noted below.    The patient was advised to call back or seek an in-person evaluation if the symptoms worsen or if the condition fails to improve as anticipated.  Time:  I spent 12 minutes with the patient via telehealth technology discussing the above problems/concerns.    Stephanie Rio, PA-C

## 2022-01-13 NOTE — Patient Instructions (Addendum)
Stephanie Rout Noga, thank you for joining Leeanne Rio, PA-C for today's virtual visit.  While this provider is not your primary care provider (PCP), if your PCP is located in our provider database this encounter information will be shared with them immediately following your visit.  Consent: (Patient) Stephanie Gray provided verbal consent for this virtual visit at the beginning of the encounter.  Current Medications:  Current Outpatient Medications:    acetaminophen (TYLENOL) 500 MG tablet, Take 2 tablets (1,000 mg total) by mouth every 6 (six) hours as needed for mild pain or fever., Disp: , Rfl:    albuterol (VENTOLIN HFA) 108 (90 Base) MCG/ACT inhaler, Inhale 2 puffs into the lungs every 6 (six) hours as needed for wheezing or shortness of breath., Disp: 8 g, Rfl: 0   docusate sodium (COLACE) 100 MG capsule, Take 1 capsule (100 mg total) by mouth daily as needed for mild constipation., Disp: , Rfl:    DULoxetine (CYMBALTA) 60 MG capsule, Take 1 capsule (60 mg total) by mouth daily., Disp: 90 capsule, Rfl: 1   ibuprofen (ADVIL) 600 MG tablet, Take 1 tablet (600 mg total) by mouth 3 (three) times daily., Disp: 30 tablet, Rfl: 0   lidocaine (LIDODERM) 5 %, Place 1 patch onto the skin daily. Remove & Discard patch within 12 hours or as directed by MD, Disp: 30 patch, Rfl: 0   methocarbamol (ROBAXIN) 500 MG tablet, Take 2 tablets (1,000 mg total) by mouth every 6 (six) hours as needed for muscle spasms., Disp: 60 tablet, Rfl: 0   polyethylene glycol (MIRALAX / GLYCOLAX) 17 g packet, Take 17 g by mouth daily as needed for mild constipation., Disp: , Rfl:    traMADol (ULTRAM) 50 MG tablet, Take 1-2 tablets (50-100 mg total) by mouth every 6 (six) hours as needed for moderate pain or severe pain (50 mg for moderate pain, 100 mg for severe pain)., Disp: 30 tablet, Rfl: 0   Trolamine Salicylate (BLUE-EMU HEMP EX), Take 1 tablet by mouth daily. Hemp muscadine, Disp: , Rfl:    Turmeric  (QC TUMERIC COMPLEX PO), Take 1 tablet by mouth daily., Disp: , Rfl:    Medications ordered in this encounter:  No orders of the defined types were placed in this encounter.    *If you need refills on other medications prior to your next appointment, please contact your pharmacy*  Follow-Up: Call back or seek an in-person evaluation if the symptoms worsen or if the condition fails to improve as anticipated.  Other Instructions I encourage you to increase hydration and the amount of fiber in your diet.    Start a daily probiotic (Align, Culturelle, Digestive Advantage, etc.). Take 2 Tbs of Milk of Magnesia in a 4 oz glass of warmed prune juice every 2-3 days to help promote bowel movement and to keep more regular over the next week.  I would go ahead and just add a Dulcolax stool softener to regimen. If this does not promote a bowel movement, please be evaluated in person.   After initial BMs, if you want or need to, you can switch to a dose of Miralax (over-the-counter) once every few days.       If you have been instructed to have an in-person evaluation today at a local Urgent Care facility, please use the link below. It will take you to a list of all of our available [facility], including address, phone number and hours of operation. Please do not delay  care.  [facility]  If you or a family member do not have a primary care provider, use the link below to schedule a visit and establish care. When you choose a [facility] primary care physician or advanced practice provider, you gain a long-term partner in health. Find a Primary Care Provider  Learn more about [facility] in-office and virtual care options: [facility]

## 2022-01-14 ENCOUNTER — Other Ambulatory Visit: Payer: Self-pay

## 2022-01-14 ENCOUNTER — Encounter (HOSPITAL_COMMUNITY): Payer: Self-pay | Admitting: Orthopedic Surgery

## 2022-01-14 DIAGNOSIS — S42021A Displaced fracture of shaft of right clavicle, initial encounter for closed fracture: Secondary | ICD-10-CM | POA: Diagnosis not present

## 2022-01-14 DIAGNOSIS — R0781 Pleurodynia: Secondary | ICD-10-CM | POA: Diagnosis not present

## 2022-01-14 DIAGNOSIS — M25519 Pain in unspecified shoulder: Secondary | ICD-10-CM | POA: Diagnosis not present

## 2022-01-14 NOTE — H&P (Signed)
Patient's anticipated LOS is less than 2 midnights, meeting these requirements: - Younger than 57 - Lives within 1 hour of care - Has a competent adult at home to recover with post-op recover - NO history of  - Chronic pain requiring opiods  - Diabetes  - Coronary Artery Disease  - Heart failure  - Heart attack  - Stroke  - DVT/VTE  - Cardiac arrhythmia  - Respiratory Failure/COPD  - Renal failure  - Anemia  - Advanced Liver disease     Stephanie Gray is an 56 y.o. female.    Chief Complaint: right shoulder pain  HPI: Pt is a 56 y.o. female complaining of right shoulder pain for multiple years. Pain had continually increased since the beginning. X-rays in the clinic right displaced clavicle fracture. Various options are discussed with the patient. Risks, benefits and expectations were discussed with the patient. Patient understand the risks, benefits and expectations and wishes to proceed with surgery.   PCP:  Marrian Salvage, FNP  D/C Plans: Home  PMH: Past Medical History:  Diagnosis Date   Anemia    after c section   Anxiety    Apocrine metaplasia of breast, right 03/03/2018   Arthritis    Atrial tachycardia, paroxysmal ([place])    noted on event monitor 02/2020   Breast mass, right 04/12/2017   Cervical disc disease    Complication of anesthesia    very sensitive to all mdications,needs lower dosage   Depression    History of COVID-19    History of HELLP syndrome, currently pregnant, third trimester    Hypertension    hasnt taken medication since 01/09/2022 because bp was low   Hypothyroidism 03/17/2018   pr denies   Neuromuscular disorder (HCC)    numbness in 2 fingers of left hand   Perimenopausal disorder 03/17/2018   Premature atrial contractions    PVC's (premature ventricular contractions)    noted on heart monitor 02/2020   Rib fracture 01/08/2022   right second, third and fourth ribs   Routine general medical examination at a health care  facility 04/06/2016   Tubular adenoma of colon 04/27/2015   04/07/2015 @ 70 cm ,polyp; Dr Collene Mares Due 2019     PSH: Past Surgical History:  Procedure Laterality Date   APPENDECTOMY     BLADDER REPAIR     BREAST LUMPECTOMY WITH RADIOACTIVE SEED LOCALIZATION Right 02/10/2017   Procedure: RIGHT BREAST LUMPECTOMY WITH RADIOACTIVE SEED LOCALIZATION;  Surgeon: Autumn Messing III, MD;  Location: [facility];  Service: General;  Laterality: Right;   CESAREAN SECTION     X 2, Dr Reino Kent   CHOLECYSTECTOMY N/A 07/11/2020   Procedure: LAPAROSCOPIC CHOLECYSTECTOMY;  Surgeon: Donnie Mesa, MD;  Location: [facility];  Service: General;  Laterality: N/A;   EYE SURGERY Bilateral    Lasik    TUBAL LIGATION     bladder laceration repair    Social History:  reports that she has never smoked. She has never used smokeless tobacco. She reports current alcohol use. She reports that she does not use drugs.  Allergies:  Allergies  Allergen Reactions   Benzodiazepines Other (See Comments)    Patient needed crash cart after tubal ligation   Other Other (See Comments)    Patient is extremely sensitive to all medicines, maybe needs 1/4 or 1/8 dosage of normal adult   Augmentin [Amoxicillin-Pot Clavulanate] Other (See Comments)   Lamictal [Lamotrigine] Rash   Penicillins Rash    Rash because of a  history of documented adverse serious drug reaction;Medi Alert bracelet  is recommended (CHILDHOOD REACTION) Has patient had a PCN reaction causing immediate rash, facial/tongue/throat swelling, SOB or lightheadedness with hypotension:UNKNOWN Has patient had a PCN reaction causing severe rash involving mucus membranes or skin necrosis:unsure Has patient had a PCN reaction that required hospitalization:unsure Has patient had a PCN reaction occurring within the last 10 years:unsure     Medications: No current facility-administered medications for this encounter.   Current Outpatient Medications  Medication Sig Dispense Refill    acetaminophen (TYLENOL) 500 MG tablet Take 2 tablets (1,000 mg total) by mouth every 6 (six) hours as needed for mild pain or fever.     albuterol (VENTOLIN HFA) 108 (90 Base) MCG/ACT inhaler Inhale 2 puffs into the lungs every 6 (six) hours as needed for wheezing or shortness of breath. 8 g 0   docusate sodium (COLACE) 100 MG capsule Take 1 capsule (100 mg total) by mouth daily as needed for mild constipation.     DULoxetine (CYMBALTA) 60 MG capsule Take 1 capsule (60 mg total) by mouth daily. 90 capsule 1   ibuprofen (ADVIL) 600 MG tablet Take 1 tablet (600 mg total) by mouth 3 (three) times daily. 30 tablet 0   lidocaine (LIDODERM) 5 % Place 1 patch onto the skin daily. Remove & Discard patch within 12 hours or as directed by MD 30 patch 0   methocarbamol (ROBAXIN) 500 MG tablet Take 2 tablets (1,000 mg total) by mouth every 6 (six) hours as needed for muscle spasms. 60 tablet 0   polyethylene glycol (MIRALAX / GLYCOLAX) 17 g packet Take 17 g by mouth daily as needed for mild constipation.     traMADol (ULTRAM) 50 MG tablet Take 1-2 tablets (50-100 mg total) by mouth every 6 (six) hours as needed for moderate pain or severe pain (50 mg for moderate pain, 100 mg for severe pain). 30 tablet 0   Trolamine Salicylate (BLUE-EMU HEMP EX) Take 1 tablet by mouth daily. Hemp muscadine     Turmeric (QC TUMERIC COMPLEX PO) Take 1 tablet by mouth daily.      No results found for this or any previous visit (from the past 48 hour(s)). No results found.  ROS: Pain with rom of the right upper extremity  Physical Exam: Alert and oriented 56 y.o. female in no acute distress Cranial nerves 2-12 intact Cervical spine: full rom with no tenderness, nv intact distally Chest: active breath sounds bilaterally, no wheeze rhonchi or rales Heart: regular rate and rhythm, no murmur Abd: non tender non distended with active bowel sounds Hip is stable with rom  Right shoulder with mild edema and ecchymosis Mild  deformity at right midshaft clavicle Nv intact distally Guarded rom of right upper extremity due to clavicle fracture  Assessment/Plan Assessment: right clavicle fracture, multiple right rib fractures  Plan:  Patient will undergo a right clavicle ORIF by Dr. Veverly Fells at [facility] Risks benefits and expectations were discussed with the patient. Patient understand risks, benefits and expectations and wishes to proceed. Preoperative templating of the joint replacement has been completed, documented, and submitted to the Operating Room personnel in order to optimize intra-operative equipment management.   Merla Riches PA-C, [facility] is now [facility] [employer] [address] [place], [place], [place] [address] Phone: [phone] [email or web address] Facebook   [employer]

## 2022-01-14 NOTE — Progress Notes (Signed)
For Short Stay: [facility] appointment date:N/A Date of COVID positive in last 37 days: N/A   For Anesthesia: PCP - [place], FNP Cardiologist - Larey Dresser, MD  Chest x-ray - 01/10/22 in epic EKG - 10/13/21 in epic Stress Test - N/A ECHO - 11/20/21 in epic Cardiac Cath - N/A Pacemaker/ICD device last checked: N/A Pacemaker orders received:N/A Device Rep notified:N/A  Spinal Cord Stimulator: N/A  Sleep Study -N/A CPAP - N/A  Fasting Blood Sugar - N/A  Checks Blood Sugar __N/A___ times a day Date and result of last Hgb A1c- N/A  Blood Thinner Instructions:N/A Aspirin Instructions: N/A Last Dose: N/A  Activity level: Able to exercise without chest pain and/or shortness of breath       Anesthesia review: 01/08/22 Small right apical pneumothorax after being thrown from horse  Patient denies shortness of breath, fever, cough and chest pain at PAT appointment   Patient verbalized understanding of instructions that were given to them at the PAT appointment. Patient was also instructed that they will need to review over the PAT instructions again at home before surgery.

## 2022-01-15 ENCOUNTER — Ambulatory Visit (HOSPITAL_COMMUNITY): Payer: BC Managed Care – PPO | Admitting: Anesthesiology

## 2022-01-15 ENCOUNTER — Ambulatory Visit (HOSPITAL_COMMUNITY): Payer: BC Managed Care – PPO

## 2022-01-15 ENCOUNTER — Encounter (HOSPITAL_COMMUNITY)
Admission: RE | Disposition: A | Discharge status: Home / Self Care | Payer: Self-pay | Source: Home / Self Care | Attending: Orthopedic Surgery

## 2022-01-15 ENCOUNTER — Encounter (HOSPITAL_COMMUNITY): Payer: Self-pay | Admitting: Orthopedic Surgery

## 2022-01-15 ENCOUNTER — Other Ambulatory Visit: Payer: Self-pay

## 2022-01-15 ENCOUNTER — Ambulatory Visit (HOSPITAL_COMMUNITY)
Admission: RE | Admit: 2022-01-15 | Discharge: 2022-01-15 | Disposition: A | Discharge status: Home / Self Care | Payer: BC Managed Care – PPO | Attending: Orthopedic Surgery | Admitting: Orthopedic Surgery

## 2022-01-15 DIAGNOSIS — F419 Anxiety disorder, unspecified: Secondary | ICD-10-CM | POA: Diagnosis not present

## 2022-01-15 DIAGNOSIS — F32A Depression, unspecified: Secondary | ICD-10-CM | POA: Insufficient documentation

## 2022-01-15 DIAGNOSIS — S42001A Fracture of unspecified part of right clavicle, initial encounter for closed fracture: Secondary | ICD-10-CM | POA: Insufficient documentation

## 2022-01-15 DIAGNOSIS — S2241XA Multiple fractures of ribs, right side, initial encounter for closed fracture: Secondary | ICD-10-CM | POA: Diagnosis not present

## 2022-01-15 DIAGNOSIS — X58XXXA Exposure to other specified factors, initial encounter: Secondary | ICD-10-CM | POA: Diagnosis not present

## 2022-01-15 DIAGNOSIS — S42021A Displaced fracture of shaft of right clavicle, initial encounter for closed fracture: Secondary | ICD-10-CM | POA: Diagnosis not present

## 2022-01-15 DIAGNOSIS — S42001B Fracture of unspecified part of right clavicle, initial encounter for open fracture: Secondary | ICD-10-CM

## 2022-01-15 DIAGNOSIS — J984 Other disorders of lung: Secondary | ICD-10-CM | POA: Diagnosis not present

## 2022-01-15 HISTORY — DX: Supervision of pregnancy with other poor reproductive or obstetric history, third trimester: O09.293

## 2022-01-15 HISTORY — DX: Anemia, unspecified: D64.9

## 2022-01-15 HISTORY — PX: ORIF CLAVICULAR FRACTURE: SHX5055

## 2022-01-15 HISTORY — DX: Personal history of COVID-19: Z86.16

## 2022-01-15 SURGERY — OPEN REDUCTION INTERNAL FIXATION (ORIF) CLAVICULAR FRACTURE
Anesthesia: General | Laterality: Right

## 2022-01-15 MED ORDER — FENTANYL CITRATE (PF) 100 MCG/2ML IJ SOLN
INTRAMUSCULAR | Status: AC
  Filled 2022-01-15: qty 2

## 2022-01-15 MED ORDER — FENTANYL CITRATE (PF) 100 MCG/2ML IJ SOLN
INTRAMUSCULAR | Status: DC | PRN
  Administered 2022-01-15 (×2): 50 ug via INTRAVENOUS

## 2022-01-15 MED ORDER — PROPOFOL 10 MG/ML IV BOLUS
INTRAVENOUS | Status: DC | PRN
  Administered 2022-01-15: 150 mg via INTRAVENOUS

## 2022-01-15 MED ORDER — ROCURONIUM BROMIDE 10 MG/ML (PF) SYRINGE
PREFILLED_SYRINGE | INTRAVENOUS | Status: AC
  Filled 2022-01-15: qty 10

## 2022-01-15 MED ORDER — OXYCODONE HCL 5 MG PO TABS: 5.0000 mg | ORAL_TABLET | Freq: Once | ORAL | Status: DC | PRN

## 2022-01-15 MED ORDER — OXYCODONE HCL 5 MG/5ML PO SOLN: 5.0000 mg | Freq: Once | ORAL | Status: DC | PRN

## 2022-01-15 MED ORDER — DEXAMETHASONE SODIUM PHOSPHATE 10 MG/ML IJ SOLN
INTRAMUSCULAR | Status: DC | PRN
  Administered 2022-01-15: 4 mg via INTRAVENOUS

## 2022-01-15 MED ORDER — ROCURONIUM BROMIDE 10 MG/ML (PF) SYRINGE
PREFILLED_SYRINGE | INTRAVENOUS | Status: DC | PRN
Start: 2022-01-15 — End: 2022-01-15
  Administered 2022-01-15: 100 mg via INTRAVENOUS

## 2022-01-15 MED ORDER — LACTATED RINGERS IV SOLN: INTRAVENOUS | Status: DC

## 2022-01-15 MED ORDER — CHLORHEXIDINE GLUCONATE 0.12 % MT SOLN
15.0000 mL | Freq: Once | OROMUCOSAL | Status: AC
  Administered 2022-01-15: 15 mL via OROMUCOSAL

## 2022-01-15 MED ORDER — BUPIVACAINE-EPINEPHRINE (PF) 0.25% -1:200000 IJ SOLN
INTRAMUSCULAR | Status: AC
  Filled 2022-01-15: qty 30

## 2022-01-15 MED ORDER — PROMETHAZINE HCL 25 MG/ML IJ SOLN: 6.2500 mg | INTRAMUSCULAR | Status: DC | PRN

## 2022-01-15 MED ORDER — KETOROLAC TROMETHAMINE 30 MG/ML IJ SOLN
INTRAMUSCULAR | Status: DC | PRN
  Administered 2022-01-15: 30 mg via INTRAVENOUS

## 2022-01-15 MED ORDER — EPHEDRINE SULFATE-NACL 50-0.9 MG/10ML-% IV SOSY
PREFILLED_SYRINGE | INTRAVENOUS | Status: DC | PRN
Start: 2022-01-15 — End: 2022-01-15
  Administered 2022-01-15 (×2): 5 mg via INTRAVENOUS
  Administered 2022-01-15: 10 mg via INTRAVENOUS
  Administered 2022-01-15: 5 mg via INTRAVENOUS

## 2022-01-15 MED ORDER — PHENYLEPHRINE HCL-NACL 20-0.9 MG/250ML-% IV SOLN
INTRAVENOUS | Status: DC | PRN
  Administered 2022-01-15: 40 ug/min via INTRAVENOUS

## 2022-01-15 MED ORDER — CEFAZOLIN SODIUM-DEXTROSE 2-4 GM/100ML-% IV SOLN
2.0000 g | INTRAVENOUS | Status: AC
  Administered 2022-01-15: 2 g via INTRAVENOUS
  Filled 2022-01-15: qty 100

## 2022-01-15 MED ORDER — LIDOCAINE 2% (20 MG/ML) 5 ML SYRINGE
INTRAMUSCULAR | Status: DC | PRN
Start: 2022-01-15 — End: 2022-01-15
  Administered 2022-01-15: 60 mg via INTRAVENOUS

## 2022-01-15 MED ORDER — SUGAMMADEX SODIUM 200 MG/2ML IV SOLN
INTRAVENOUS | Status: DC | PRN
  Administered 2022-01-15: 250 mg via INTRAVENOUS

## 2022-01-15 MED ORDER — TRAMADOL HCL 50 MG PO TABS
50.0000 mg | ORAL_TABLET | Freq: Four times a day (QID) | ORAL | 0 refills | Status: DC | PRN
Start: 2022-01-15 — End: 2022-09-26

## 2022-01-15 MED ORDER — HYDROMORPHONE HCL 2 MG/ML IJ SOLN
INTRAMUSCULAR | Status: AC
  Filled 2022-01-15: qty 1

## 2022-01-15 MED ORDER — KETAMINE HCL 10 MG/ML IJ SOLN
INTRAMUSCULAR | Status: DC | PRN
  Administered 2022-01-15: 30 mg via INTRAVENOUS

## 2022-01-15 MED ORDER — ACETAMINOPHEN 10 MG/ML IV SOLN
INTRAVENOUS | Status: AC
  Filled 2022-01-15: qty 100

## 2022-01-15 MED ORDER — PHENYLEPHRINE 40 MCG/ML (10ML) SYRINGE FOR IV PUSH (FOR BLOOD PRESSURE SUPPORT)
PREFILLED_SYRINGE | INTRAVENOUS | Status: DC | PRN
  Administered 2022-01-15 (×4): 80 ug via INTRAVENOUS

## 2022-01-15 MED ORDER — HYDROMORPHONE HCL 1 MG/ML IJ SOLN
INTRAMUSCULAR | Status: DC | PRN
Start: 2022-01-15 — End: 2022-01-15
  Administered 2022-01-15 (×2): .5 mg via INTRAVENOUS

## 2022-01-15 MED ORDER — KETAMINE HCL 50 MG/5ML IJ SOSY
PREFILLED_SYRINGE | INTRAMUSCULAR | Status: AC
  Filled 2022-01-15: qty 5

## 2022-01-15 MED ORDER — FENTANYL CITRATE PF 50 MCG/ML IJ SOSY
50.0000 ug | PREFILLED_SYRINGE | INTRAMUSCULAR | Status: DC
  Filled 2022-01-15: qty 2

## 2022-01-15 MED ORDER — KETOROLAC TROMETHAMINE 30 MG/ML IJ SOLN: 30.0000 mg | Freq: Once | INTRAMUSCULAR | Status: DC | PRN

## 2022-01-15 MED ORDER — PROPOFOL 10 MG/ML IV BOLUS
INTRAVENOUS | Status: AC
  Filled 2022-01-15: qty 20

## 2022-01-15 MED ORDER — MIDAZOLAM HCL 5 MG/5ML IJ SOLN
INTRAMUSCULAR | Status: DC | PRN
  Administered 2022-01-15: 2 mg via INTRAVENOUS

## 2022-01-15 MED ORDER — BUPIVACAINE-EPINEPHRINE 0.25% -1:200000 IJ SOLN
INTRAMUSCULAR | Status: DC | PRN
  Administered 2022-01-15: 20 mL

## 2022-01-15 MED ORDER — HYDROMORPHONE HCL 1 MG/ML IJ SOLN: 0.2500 mg | INTRAMUSCULAR | Status: DC | PRN

## 2022-01-15 MED ORDER — ONDANSETRON HCL 4 MG/2ML IJ SOLN
INTRAMUSCULAR | Status: DC | PRN
  Administered 2022-01-15: 4 mg via INTRAVENOUS

## 2022-01-15 MED ORDER — ORAL CARE MOUTH RINSE: 15.0000 mL | Freq: Once | OROMUCOSAL | Status: AC

## 2022-01-15 MED ORDER — MIDAZOLAM HCL 2 MG/2ML IJ SOLN
INTRAMUSCULAR | Status: AC
  Filled 2022-01-15: qty 2

## 2022-01-15 MED ORDER — ACETAMINOPHEN 10 MG/ML IV SOLN
INTRAVENOUS | Status: DC | PRN
  Administered 2022-01-15: 1000 mg via INTRAVENOUS

## 2022-01-15 MED ORDER — 0.9 % SODIUM CHLORIDE (POUR BTL) OPTIME
TOPICAL | Status: DC | PRN
Start: 2022-01-15 — End: 2022-01-15
  Administered 2022-01-15: 1000 mL

## 2022-01-15 SURGICAL SUPPLY — 53 items
BAG COUNTER SPONGE SURGICOUNT (BAG) ×1 IMPLANT
BAG SPEC THK2 15X12 ZIP CLS (MISCELLANEOUS) ×1
BAG SPNG CNTER NS LX DISP (BAG) ×1
BAG ZIPLOCK 12X15 (MISCELLANEOUS) ×2 IMPLANT
BIT DRILL CLAV ALPS 2.7X145 (BIT) ×1 IMPLANT
BOOTIES KNEE HIGH SLOAN (MISCELLANEOUS) ×4 IMPLANT
CLSR STERI-STRIP ANTIMIC 1/2X4 (GAUZE/BANDAGES/DRESSINGS) ×1 IMPLANT
COVER SURGICAL LIGHT HANDLE (MISCELLANEOUS) ×2 IMPLANT
DECANTER SPIKE VIAL GLASS SM (MISCELLANEOUS) ×2 IMPLANT
DRAPE ORTHO SPLIT 77X108 STRL (DRAPES) ×4
DRAPE POUCH INSTRU U-SHP 10X18 (DRAPES) ×2 IMPLANT
DRAPE SURG 17X11 SM STRL (DRAPES) ×2 IMPLANT
DRAPE SURG ORHT 6 SPLT 77X108 (DRAPES) ×2 IMPLANT
DRAPE U-SHAPE 47X51 STRL (DRAPES) ×2 IMPLANT
DRSG EMULSION OIL 3X3 NADH (GAUZE/BANDAGES/DRESSINGS) ×2 IMPLANT
DRSG PAD ABDOMINAL 8X10 ST (GAUZE/BANDAGES/DRESSINGS) ×2 IMPLANT
DURAPREP 26ML APPLICATOR (WOUND CARE) ×2 IMPLANT
ELECT NDL TIP 2.8 STRL (NEEDLE) ×1 IMPLANT
ELECT NEEDLE TIP 2.8 STRL (NEEDLE) ×2 IMPLANT
ELECT REM PT RETURN 15FT ADLT (MISCELLANEOUS) ×2 IMPLANT
GAUZE SPONGE 4X4 12PLY STRL (GAUZE/BANDAGES/DRESSINGS) ×2 IMPLANT
GLOVE SURG ORTHO LTX SZ7.5 (GLOVE) ×2 IMPLANT
GLOVE SURG ORTHO LTX SZ8.5 (GLOVE) ×2 IMPLANT
GLOVE SURG UNDER POLY LF SZ7.5 (GLOVE) ×2 IMPLANT
GLOVE SURG UNDER POLY LF SZ8.5 (GLOVE) ×2 IMPLANT
GOWN STRL REUS W/TWL XL LVL3 (GOWN DISPOSABLE) ×4 IMPLANT
KIT BASIN OR (CUSTOM PROCEDURE TRAY) ×2 IMPLANT
KIT TURNOVER KIT A (KITS) ×1 IMPLANT
MANIFOLD NEPTUNE II (INSTRUMENTS) ×2 IMPLANT
NDL SAFETY ECLIPSE 18X1.5 (NEEDLE) ×1 IMPLANT
NEEDLE HYPO 18GX1.5 SHARP (NEEDLE) ×2
NS IRRIG 1000ML POUR BTL (IV SOLUTION) ×2 IMPLANT
PACK SHOULDER (CUSTOM PROCEDURE TRAY) ×2 IMPLANT
PLATE CLAV ANT 100 9H (Plate) ×1 IMPLANT
PROTECTOR NERVE ULNAR (MISCELLANEOUS) ×2 IMPLANT
SCREW CORT LP 3.5X12 (Screw) ×1 IMPLANT
SCREW CORT LP 3.5X14 (Screw) ×3 IMPLANT
SCREW CORT LP T15 3.5X16 (Screw) ×3 IMPLANT
SCREW TIS LP 3.5X18 NS (Screw) ×2 IMPLANT
SLING ARM FOAM STRAP MED (SOFTGOODS) ×1 IMPLANT
SLING ARM IMMOBILIZER LRG (SOFTGOODS) ×2 IMPLANT
SPONGE T-LAP 18X18 ~~LOC~~+RFID (SPONGE) ×1 IMPLANT
SPONGE T-LAP 4X18 ~~LOC~~+RFID (SPONGE) ×1 IMPLANT
STRIP CLOSURE SKIN 1/2X4 (GAUZE/BANDAGES/DRESSINGS) ×2 IMPLANT
SUCTION FRAZIER HANDLE 12FR (TUBING) ×2
SUCTION TUBE FRAZIER 12FR DISP (TUBING) ×1 IMPLANT
SUT FIBERWIRE #2 38 T-5 BLUE (SUTURE) ×2
SUT MNCRL AB 3-0 PS2 18 (SUTURE) ×2 IMPLANT
SUT VIC AB 0 CT1 36 (SUTURE) ×2 IMPLANT
SUT VIC AB 2-0 CT1 27 (SUTURE) ×2
SUT VIC AB 2-0 CT1 TAPERPNT 27 (SUTURE) ×1 IMPLANT
SUTURE FIBERWR #2 38 T-5 BLUE (SUTURE) IMPLANT
TOWEL OR 17X26 10 PK STRL BLUE (TOWEL DISPOSABLE) ×2 IMPLANT

## 2022-01-15 NOTE — Anesthesia Procedure Notes (Signed)
Procedure Name: Intubation Date/Time: 01/15/2022 2:09 PM Performed by: Lavina Hamman, CRNA Pre-anesthesia Checklist: Patient identified, Emergency Drugs available, Suction available, Patient being monitored and Timeout performed Patient Re-evaluated:Patient Re-evaluated prior to induction Oxygen Delivery Method: Circle system utilized Preoxygenation: Pre-oxygenation with 100% oxygen Induction Type: IV induction Ventilation: Mask ventilation without difficulty Laryngoscope Size: Mac and 3 Grade View: Grade II Tube type: Oral Tube size: 7.0 mm Number of attempts: 1 Airway Equipment and Method: Stylet Placement Confirmation: ETT inserted through vocal cords under direct vision, positive ETCO2, CO2 detector and breath sounds checked- equal and bilateral Secured at: 21 cm Tube secured with: Tape Dental Injury: Teeth and Oropharynx as per pre-operative assessment  Comments: ATOI

## 2022-01-15 NOTE — Discharge Instructions (Signed)
Ice to the shoulder constantly.  Keep the incision covered and clean and dry for one week, then ok to get it wet in the shower.  May remove your bandage on Monday next week and leave open to air  DO NOT reach behind your back or push up out of a chair with the operative arm. Be gentle with the right arm  Use a sling while you are up and around for comfort, may remove while seated.  Keep pillow propped behind the operative elbow.  Follow up with Dr Veverly Fells in two weeks in the office, call [phone] for appt

## 2022-01-15 NOTE — Transfer of Care (Signed)
Immediate Anesthesia Transfer of Care Note  Patient: Stephanie Gray  Procedure(s) Performed: OPEN REDUCTION INTERNAL FIXATION (ORIF) CLAVICULAR FRACTURE (Right)  Patient Location: PACU  Anesthesia Type:General  Level of Consciousness: sedated  Airway & Oxygen Therapy: Patient Spontanous Breathing and Patient connected to face mask oxygen  Post-op Assessment: Report given to RN and Post -op Vital signs reviewed and stable  Post vital signs: Reviewed and stable  Last Vitals:  Vitals Value Taken Time  BP    Temp    Pulse 98 01/15/22 1533  Resp 28 01/15/22 1533  SpO2 95 % 01/15/22 1533  Vitals shown include unvalidated device data.  Last Pain:  Vitals:   01/15/22 1109  TempSrc:   PainSc: 5       Patients Stated Pain Goal: 4 (43/32/95 1884)  Complications: No notable events documented.

## 2022-01-15 NOTE — Interval H&P Note (Signed)
History and Physical Interval Note:  01/15/2022 1:04 PM  Stephanie Gray  has presented today for surgery, with the diagnosis of right clavicle fracture.  The various methods of treatment have been discussed with the patient and family. After consideration of risks, benefits and other options for treatment, the patient has consented to  Procedure(s): OPEN REDUCTION INTERNAL FIXATION (ORIF) CLAVICULAR FRACTURE (Right) as a surgical intervention.  The patient's history has been reviewed, patient examined, no change in status, stable for surgery.  I have reviewed the patient's chart and labs.  Questions were answered to the patient's satisfaction.     Augustin Schooling

## 2022-01-15 NOTE — Brief Op Note (Signed)
01/15/2022  3:29 PM  PATIENT:  Stephanie Gray  56 y.o. female  PRE-OPERATIVE DIAGNOSIS:  right clavicle fracture, displaced and comminuted  POST-OPERATIVE DIAGNOSIS:  right clavicle fracture, displaced and comminuted  PROCEDURE:  Procedure(s): OPEN REDUCTION INTERNAL FIXATION (ORIF) CLAVICULAR FRACTURE (Right)  SURGEON:  Surgeon(s) and Role:    Netta Cedars, MD - Primary  PHYSICIAN ASSISTANT:   ASSISTANTS: [place]ura Bruns, PA-C   ANESTHESIA:  local and general  EBL:  25 mL   BLOOD ADMINISTERED:none  DRAINS: none   LOCAL MEDICATIONS USED:  MARCAINE     SPECIMEN:  No Specimen  DISPOSITION OF SPECIMEN:  N/A  COUNTS:  YES  TOURNIQUET:  * No tourniquets in log *  DICTATION: .Other Dictation: Dictation Number [id number]  PLAN OF CARE: Discharge to home after PACU  PATIENT DISPOSITION:  PACU - hemodynamically stable.   Delay start of Pharmacological VTE agent (>24hrs) due to surgical blood loss or risk of bleeding: not applicable

## 2022-01-15 NOTE — Anesthesia Postprocedure Evaluation (Signed)
Anesthesia Post Note  Patient: Stephanie Gray  Procedure(s) Performed: OPEN REDUCTION INTERNAL FIXATION (ORIF) CLAVICULAR FRACTURE (Right)     Patient location during evaluation: PACU Anesthesia Type: General Level of consciousness: awake and alert, patient cooperative and oriented Pain management: pain level controlled Vital Signs Assessment: post-procedure vital signs reviewed and stable Respiratory status: spontaneous breathing, nonlabored ventilation and respiratory function stable Cardiovascular status: blood pressure returned to baseline and stable Postop Assessment: no apparent nausea or vomiting, able to ambulate and adequate PO intake Anesthetic complications: no   No notable events documented.  Last Vitals:  Vitals:   01/15/22 1615 01/15/22 1630  BP: 104/61 98/64  Pulse: 86 95  Resp: 16 (!) 8  Temp:  36.8 C  SpO2: 94% 92%    Last Pain:  Vitals:   01/15/22 1630  TempSrc:   PainSc: 0-No pain                 ,E. 

## 2022-01-15 NOTE — Anesthesia Preprocedure Evaluation (Signed)
Anesthesia Evaluation  Patient identified by MRN, date of birth, ID band Patient awake    Reviewed: Allergy & Precautions, NPO status , Patient's Chart, lab work & pertinent test results  Airway Mallampati: II  TM Distance: >3 FB Neck ROM: Full    Dental no notable dental hx.    Pulmonary neg pulmonary ROS,    Pulmonary exam normal breath sounds clear to auscultation       Cardiovascular hypertension, Normal cardiovascular exam Rhythm:Regular Rate:Normal     Neuro/Psych Anxiety Depression negative neurological ROS     GI/Hepatic negative GI ROS, Neg liver ROS,   Endo/Other  negative endocrine ROS  Renal/GU negative Renal ROS  negative genitourinary   Musculoskeletal negative musculoskeletal ROS (+)   Abdominal   Peds negative pediatric ROS (+)  Hematology   Anesthesia Other Findings   Reproductive/Obstetrics negative OB ROS                             Anesthesia Physical Anesthesia Plan  ASA: 2  Anesthesia Plan: General   Post-op Pain Management:    Induction: Intravenous  PONV Risk Score and Plan: 3 and Ondansetron, Dexamethasone, Midazolam and Treatment may vary due to age or medical condition  Airway Management Planned: Oral ETT  Additional Equipment:   Intra-op Plan:   Post-operative Plan: Extubation in OR  Informed Consent: I have reviewed the patients History and Physical, chart, labs and discussed the procedure including the risks, benefits and alternatives for the proposed anesthesia with the patient or authorized representative who has indicated his/her understanding and acceptance.     Dental advisory given  Plan Discussed with: CRNA and Surgeon  Anesthesia Plan Comments:         Anesthesia Quick Evaluation

## 2022-01-15 NOTE — Anesthesia Procedure Notes (Signed)
Date/Time: 01/15/2022 3:23 PM Performed by: Cynda Familia, CRNA Oxygen Delivery Method: Simple face mask Placement Confirmation: positive ETCO2 and breath sounds checked- equal and bilateral Dental Injury: Teeth and Oropharynx as per pre-operative assessment

## 2022-01-16 NOTE — Op Note (Signed)
NAMEEMALEIGH, Stephanie Gray MEDICAL RECORD NO: [id number] ACCOUNT NO: [id number] DATE OF BIRTH: 1966-10-18 FACILITY: [facility] LOCATION: [facility] PHYSICIAN: Doran Heater. Veverly Fells, MD  Operative Report   DATE OF PROCEDURE: 01/15/2022   PREOPERATIVE DIAGNOSIS:  Right displaced and comminuted clavicle fracture with associated six rib fractures on the right.  POSTOPERATIVE DIAGNOSIS:  Right displaced and comminuted clavicle fracture with associated six rib fractures on the right.   PROCEDURE PERFORMED:  Right clavicle ORIF using anterior [employer] ALPS plate.  ATTENDING SURGEON:  Dr. Esmond Plants.  ASSISTANT:  Darol Destine, [place], who was necessary for satisfactory completion of surgery and adequate exposure and maintenance of reduction of the fracture.    General anesthesia was used plus local.  ESTIMATED BLOOD LOSS:  Minimal, less than 25 mL  FLUID REPLACEMENT:  1000 mL crystalloid.  Instrument counts were correct.  No complications.  Perioperative antibiotics were given.  INDICATIONS:  The patient is a 56 year old female who suffered a horse injury where she was thrown landing on her right side.  The patient presented to the [facility] emergency department with 6 displaced rib fractures, a small pneumothorax and a  comminuted and displaced clavicle fracture.  Given the persistence of pain in her chest wall and the instability of her hemithorax, we discussed ORIF of her clavicle to give stability to her right hemithorax.  The patient agreed with this plan.  Risks  including but not limited to infection, nonunion, potential exacerbation of her pneumothorax were discussed in detail with the patient.  We also discussed local nerve injury, creating areas of anesthesia on the dorsal shoulder.  Informed consent  obtained.   DESCRIPTION OF PROCEDURE:  After an adequate level of anesthesia was achieved, the patient was positioned in modified beach chair position.  Right shoulder was correctly  identified and sterilely prepped and draped in the usual manner.  Timeout called,  verifying correct patient, correct site.  We used a dorsal incision overlying the clavicle.  Dissection down through subcutaneous tissues using Bovie.  We split the muscular layer straight down to the bone and did subperiosteal dissection of the medial  and lateral clavicle fragments.  These were grossly displaced about a centimeter and 1.5 cm apart with the medial fragment displaced superiorly and the lateral fragment maintained in its position.  Thanks to intact [facility] and CC ligaments.  There was an  anterior butterfly fragment.  We reduced the fracture.  I used a crab claw clamp to hold the fracture in place.  We then fashioned an 8-hole anterior plate by [employer] which was in the ALPS system.  We contoured it in situ and then placed the plate against  the anterior cortex of the clavicle with three crab claw clamps holding the plate and the clavicle reduced.  We then started laterally placing nonlocking screws of appropriate length anterior to posterior and basically drawing the plate down to the  bone. Anteriorly, we placed two interfragmentary screws providing good compression across the fracture site and then we had a total of three additional screws laterally, which were bicortical, so at least 6 cortices up to 8 cortices on either side of the  fracture.  Fracture was anatomically reduced and compressed and the butterfly fragment was contained in its appropriate position.  We irrigated thoroughly, checked our screw lengths to make sure those are appropriate.  We then repaired the muscular  layer with 0 Vicryl suture followed by 2-0 Vicryl for subcutaneous closure and 4-0 Monocryl for skin.  Steri-Strips  were applied followed by sterile dressing.  The patient tolerated surgery well.     SUJ D: 01/15/2022 3:34:11 pm T: 01/16/2022 2:01:00 am  JOB: [id number]/ [id number]

## 2022-01-19 ENCOUNTER — Other Ambulatory Visit: Payer: Self-pay

## 2022-01-19 ENCOUNTER — Encounter (HOSPITAL_COMMUNITY): Payer: Self-pay | Admitting: Orthopedic Surgery

## 2022-01-19 NOTE — Telephone Encounter (Signed)
Pt called stating she is out of refills on Valsartan and that the prescription needs to be moved from [place] back to [place] for the refills.

## 2022-01-19 NOTE — Telephone Encounter (Signed)
I have called pt back and informed her that I will send the provider a message to the Valsartan refilled. Her Cardiologist prescribed that medication and stated that she was good to go back to PCP. We have not filled it for her so I am unable to make that determination.  If that medication is okay to fill please sent it to  [id number] - [place], [address] Phone:  [phone]  Fax:  [phone]    Pt reports that she only has 2-3 pills left.   I have instructed her to call cards to see if she is able to get are fill until she gets seen. She stated that she will try to call them tomorrow.   She was due for a visit in October and stated that she will call back to make the appointment, however since she is now recovering from her surgery it is hard for her to move.

## 2022-01-20 ENCOUNTER — Encounter: Payer: Self-pay | Admitting: Family

## 2022-01-20 MED ORDER — VALSARTAN 160 MG PO TABS: 160.0000 mg | ORAL_TABLET | Freq: Every day | ORAL | 1 refills | Status: DC

## 2022-01-21 ENCOUNTER — Inpatient Hospital Stay: Payer: BC Managed Care – PPO | Admitting: Family

## 2022-01-21 NOTE — Telephone Encounter (Signed)
Rx has been sent to pharmacy. Pt notified

## 2022-01-22 ENCOUNTER — Telehealth (HOSPITAL_COMMUNITY): Payer: Self-pay | Admitting: Surgery

## 2022-01-22 DIAGNOSIS — M79645 Pain in left finger(s): Secondary | ICD-10-CM | POA: Diagnosis not present

## 2022-01-22 DIAGNOSIS — M79642 Pain in left hand: Secondary | ICD-10-CM | POA: Diagnosis not present

## 2022-01-22 DIAGNOSIS — S62525A Nondisplaced fracture of distal phalanx of left thumb, initial encounter for closed fracture: Secondary | ICD-10-CM | POA: Diagnosis not present
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
## 2022-01-28 DIAGNOSIS — Z4789 Encounter for other orthopedic aftercare: Secondary | ICD-10-CM | POA: Diagnosis not present

## 2022-02-25 DIAGNOSIS — Z4789 Encounter for other orthopedic aftercare: Secondary | ICD-10-CM | POA: Diagnosis not present

## 2022-03-09 DIAGNOSIS — S62525A Nondisplaced fracture of distal phalanx of left thumb, initial encounter for closed fracture: Secondary | ICD-10-CM | POA: Diagnosis not present

## 2022-03-09 DIAGNOSIS — M79645 Pain in left finger(s): Secondary | ICD-10-CM | POA: Diagnosis not present

## 2022-05-01 ENCOUNTER — Other Ambulatory Visit (HOSPITAL_COMMUNITY): Payer: Self-pay | Admitting: Psychiatry

## 2022-05-01 DIAGNOSIS — F419 Anxiety disorder, unspecified: Secondary | ICD-10-CM

## 2022-05-01 DIAGNOSIS — F330 Major depressive disorder, recurrent, mild: Secondary | ICD-10-CM

## 2022-05-03 ENCOUNTER — Telehealth (HOSPITAL_BASED_OUTPATIENT_CLINIC_OR_DEPARTMENT_OTHER): Payer: BC Managed Care – PPO | Admitting: Psychiatry

## 2022-05-03 ENCOUNTER — Encounter (HOSPITAL_COMMUNITY): Payer: Self-pay | Admitting: Psychiatry

## 2022-05-03 DIAGNOSIS — F419 Anxiety disorder, unspecified: Secondary | ICD-10-CM

## 2022-05-03 DIAGNOSIS — F330 Major depressive disorder, recurrent, mild: Secondary | ICD-10-CM | POA: Diagnosis not present

## 2022-05-03 MED ORDER — DULOXETINE HCL 60 MG PO CPEP: 60.0000 mg | ORAL_CAPSULE | Freq: Every day | ORAL | 1 refills | Status: DC

## 2022-05-03 NOTE — Progress Notes (Signed)
Virtual Visit via Telephone Note ? ?I connected with Stephanie Gray on 05/03/22 at  2:00 PM EDT by telephone and verified that I am speaking with the correct person using two identifiers. ? ?Location: ?Patient: In Car ?Provider: Home Office ?  ?I discussed the limitations, risks, security and privacy concerns of performing an evaluation and management service by telephone and the availability of in person appointments. I also discussed with the patient that there may be a patient responsible charge related to this service. The patient expressed understanding and agreed to proceed. ? ? ?History of Present Illness: ?Patient is evaluated by phone session.  She told had a fall from the horse few months ago and she fell on a concrete.  [facility] but did not have a good experience until she moved on the trauma floor.  She required surgery.  She is feeling much better.  She started back slowly and gradually horse riding and today she is going to horse show.  Patient told despite having physical injury she was mentally very stable.  She had supportive family.  She is happy her son is doing very well and one of her son is going to Sports coach school.  She lives with her husband is very supportive.  She denies any crying spells or any feeling of hopelessness or worthlessness.  She denies any panic attack.  Her energy level is good.  She feels fully recovered from physical injury but is still taking some time when she go for horse riding.  She like to keep the Cymbalta.  She has no concerns or side effects of the medication.  She is off from all pain medication. ? ? ?Past Psychiatric History:  ?H/O depression and anxiety. Taking meds since her son  born.  Took Paxil, Zoloft and propanolol for anxiety.  Lamictal caused rash. No h/o inpatient or suicidal. H/O episodic hypomanic-like symptoms as more energy and poor sleep but able to function. No h/o paranoia and hallucination.  Saw Letta Moynahan for 10 years.   ? ?Psychiatric Specialty Exam: ?Physical Exam  ?Review of Systems  ?Weight 145 lb (65.8 kg), last menstrual period 06/19/2020.There is no height or weight on file to calculate BMI.  ?General Appearance: NA  ?Eye Contact:  NA  ?Speech:  Clear and Coherent and Normal Rate  ?Volume:  Normal  ?Mood:  Euthymic  ?Affect:  NA  ?Thought Process:  Goal Directed  ?Orientation:  Full (Time, Place, and Person)  ?Thought Content:  WDL  ?Suicidal Thoughts:  No  ?Homicidal Thoughts:  No  ?Memory:  Immediate;   Good ?Recent;   Good ?Remote;   Good  ?Judgement:  Intact  ?Insight:  Present  ?Psychomotor Activity:  NA  ?Concentration:  Concentration: Good and Attention Span: Good  ?Recall:  Good  ?Fund of Knowledge:  Good  ?Language:  Good  ?Akathisia:  No  ?Handed:  Right  ?AIMS (if indicated):     ?Assets:  Communication Skills ?Desire for Improvement ?Housing ?Resilience ?Social Support ?Talents/Skills ?Transportation  ?ADL's:  Intact  ?Cognition:  WNL  ?Sleep:   ok  ? ? ? ? ?Assessment and Plan: ?Major depressive disorder, recurrent.  Anxiety. ? ?Patient is stable on Cymbalta 60 mg daily.  She is no longer taking any narcotic pain medication which she was given when she had a fall from horse and required procedure.  Discussed medication side effects and benefits.  Recommended to call us back if she has  any question or any concern.  Follow-up in 6 months. ? ?Follow Up Instructions: ? ?  ?I discussed the assessment and treatment plan with the patient. The patient was provided an opportunity to ask questions and all were answered. The patient agreed with the plan and demonstrated an understanding of the instructions. ?  ?The patient was advised to call back or seek an in-person evaluation if the symptoms worsen or if the condition fails to improve as anticipated. ? ?Collaboration of Care: Primary Care Provider AEB notes are available in epic to review. ? ?Patient/Guardian was advised Release of Information  must be obtained prior to any record release in order to collaborate their care with an outside provider. Patient/Guardian was advised if they have not already done so to contact the registration department to sign all necessary forms in order for [place] to release information regarding their care.  ? ?Consent: Patient/Guardian gives verbal consent for treatment and assignment of benefits for services provided during this visit. Patient/Guardian expressed understanding and agreed to proceed.   ? ?I provided 16 minutes of non-face-to-face time during this encounter. ? ? ?Kathlee Nations, MD  ?

## 2022-06-03 DIAGNOSIS — Z1389 Encounter for screening for other disorder: Secondary | ICD-10-CM | POA: Diagnosis not present

## 2022-06-03 DIAGNOSIS — Z1231 Encounter for screening mammogram for malignant neoplasm of breast: Secondary | ICD-10-CM | POA: Diagnosis not present

## 2022-06-03 DIAGNOSIS — N952 Postmenopausal atrophic vaginitis: Secondary | ICD-10-CM | POA: Diagnosis not present

## 2022-06-03 DIAGNOSIS — N951 Menopausal and female climacteric states: Secondary | ICD-10-CM | POA: Diagnosis not present

## 2022-06-08 ENCOUNTER — Other Ambulatory Visit: Payer: Self-pay | Admitting: Obstetrics and Gynecology

## 2022-06-08 DIAGNOSIS — E2839 Other primary ovarian failure: Secondary | ICD-10-CM

## 2022-06-14 ENCOUNTER — Other Ambulatory Visit: Payer: Self-pay | Admitting: Obstetrics and Gynecology

## 2022-06-14 DIAGNOSIS — R928 Other abnormal and inconclusive findings on diagnostic imaging of breast: Secondary | ICD-10-CM

## 2022-06-18 ENCOUNTER — Other Ambulatory Visit: Payer: Self-pay | Admitting: Obstetrics and Gynecology

## 2022-06-18 ENCOUNTER — Ambulatory Visit
Admission: RE | Admit: 2022-06-18 | Discharge: 2022-06-18 | Disposition: A | Discharge status: Home / Self Care | Payer: BC Managed Care – PPO | Source: Ambulatory Visit | Attending: Obstetrics and Gynecology | Admitting: Obstetrics and Gynecology

## 2022-06-18 DIAGNOSIS — R928 Other abnormal and inconclusive findings on diagnostic imaging of breast: Secondary | ICD-10-CM | POA: Diagnosis not present

## 2022-06-18 DIAGNOSIS — N6312 Unspecified lump in the right breast, upper inner quadrant: Secondary | ICD-10-CM | POA: Diagnosis not present

## 2022-06-18 DIAGNOSIS — N6001 Solitary cyst of right breast: Secondary | ICD-10-CM

## 2022-06-18 DIAGNOSIS — N6314 Unspecified lump in the right breast, lower inner quadrant: Secondary | ICD-10-CM | POA: Diagnosis not present

## 2022-06-18 LAB — HM MAMMOGRAPHY

## 2022-07-25 ENCOUNTER — Other Ambulatory Visit: Payer: Self-pay | Admitting: Family

## 2022-08-20 DIAGNOSIS — E8881 Metabolic syndrome: Secondary | ICD-10-CM | POA: Diagnosis not present

## 2022-08-20 DIAGNOSIS — R946 Abnormal results of thyroid function studies: Secondary | ICD-10-CM | POA: Diagnosis not present

## 2022-08-20 DIAGNOSIS — E7211 Homocystinuria: Secondary | ICD-10-CM | POA: Diagnosis not present

## 2022-08-20 DIAGNOSIS — E539 Vitamin B deficiency, unspecified: Secondary | ICD-10-CM | POA: Diagnosis not present

## 2022-08-20 DIAGNOSIS — M2550 Pain in unspecified joint: Secondary | ICD-10-CM | POA: Diagnosis not present

## 2022-08-27 ENCOUNTER — Telehealth: Payer: Self-pay | Admitting: Family

## 2022-08-27 ENCOUNTER — Other Ambulatory Visit: Payer: Self-pay | Admitting: Family

## 2022-08-27 NOTE — Telephone Encounter (Signed)
Due for OV; has not been seen since 07/2021;

## 2022-08-27 NOTE — Telephone Encounter (Signed)
I have called the pt and she has scheduled for 09/21/22 for a BP follow up. Pt notified.

## 2022-09-21 ENCOUNTER — Ambulatory Visit: Payer: BC Managed Care – PPO | Admitting: Family

## 2022-09-21 ENCOUNTER — Telehealth: Payer: Self-pay

## 2022-09-21 NOTE — Telephone Encounter (Signed)
First NS letter mailed to pt.

## 2022-09-23 ENCOUNTER — Telehealth: Payer: BC Managed Care – PPO | Admitting: Physician Assistant

## 2022-09-23 DIAGNOSIS — A084 Viral intestinal infection, unspecified: Secondary | ICD-10-CM | POA: Diagnosis not present

## 2022-09-23 MED ORDER — ONDANSETRON 8 MG PO TBDP: 8.0000 mg | ORAL_TABLET | Freq: Three times a day (TID) | ORAL | 0 refills | Status: DC | PRN

## 2022-09-23 NOTE — Patient Instructions (Signed)
Stephanie Gray, thank you for joining Mar Daring, PA-C for today's virtual visit.  While this provider is not your primary care provider (PCP), if your PCP is located in our provider database this encounter information will be shared with them immediately following your visit.  Consent: (Patient) Stephanie Gray provided verbal consent for this virtual visit at the beginning of the encounter.  Current Medications:  Current Outpatient Medications:    ondansetron (ZOFRAN-ODT) 8 MG disintegrating tablet, Take 1 tablet (8 mg total) by mouth every 8 (eight) hours as needed for nausea or vomiting., Disp: 20 tablet, Rfl: 0   acetaminophen (TYLENOL) 500 MG tablet, Take 2 tablets (1,000 mg total) by mouth every 6 (six) hours as needed for mild pain or fever., Disp: , Rfl:    albuterol (VENTOLIN HFA) 108 (90 Base) MCG/ACT inhaler, Inhale 2 puffs into the lungs every 6 (six) hours as needed for wheezing or shortness of breath., Disp: 8 g, Rfl: 0   amLODipine (NORVASC) 5 MG tablet, Take 5 mg by mouth daily., Disp: , Rfl:    docusate sodium (COLACE) 100 MG capsule, Take 1 capsule (100 mg total) by mouth daily as needed for mild constipation., Disp: , Rfl:    DULoxetine (CYMBALTA) 60 MG capsule, Take 1 capsule (60 mg total) by mouth daily., Disp: 90 capsule, Rfl: 1   ibuprofen (ADVIL) 600 MG tablet, Take 1 tablet (600 mg total) by mouth 3 (three) times daily. (Patient not taking: Reported on 05/03/2022), Disp: 30 tablet, Rfl: 0   lidocaine (LIDODERM) 5 %, Place 1 patch onto the skin daily. Remove & Discard patch within 12 hours or as directed by MD (Patient not taking: Reported on 05/03/2022), Disp: 30 patch, Rfl: 0   methocarbamol (ROBAXIN) 500 MG tablet, Take 2 tablets (1,000 mg total) by mouth every 6 (six) hours as needed for muscle spasms. (Patient not taking: Reported on 05/03/2022), Disp: 60 tablet, Rfl: 0   polyethylene glycol (MIRALAX / GLYCOLAX) 17 g packet, Take 17 g by mouth daily  as needed for mild constipation., Disp: , Rfl:    traMADol (ULTRAM) 50 MG tablet, Take 1-2 tablets (50-100 mg total) by mouth every 6 (six) hours as needed for moderate pain or severe pain (50 mg for moderate pain, 100 mg for severe pain). (Patient not taking: Reported on 05/03/2022), Disp: 30 tablet, Rfl: 0   Trolamine Salicylate (BLUE-EMU HEMP EX), Take 1 tablet by mouth daily. Hemp muscadine, Disp: , Rfl:    Turmeric (QC TUMERIC COMPLEX PO), Take 1 tablet by mouth daily., Disp: , Rfl:    valsartan (DIOVAN) 160 MG tablet, TAKE ONE TABLET BY MOUTH EVERY DAY, Disp: 30 tablet, Rfl: 0   Medications ordered in this encounter:  Meds ordered this encounter  Medications   ondansetron (ZOFRAN-ODT) 8 MG disintegrating tablet    Sig: Take 1 tablet (8 mg total) by mouth every 8 (eight) hours as needed for nausea or vomiting.    Dispense:  20 tablet    Refill:  0    Order Specific Question:   Supervising Provider    Answer:   Chase Picket [id number]     *If you need refills on other medications prior to your next appointment, please contact your pharmacy*  Follow-Up: Call back or seek an in-person evaluation if the symptoms worsen or if the condition fails to improve as anticipated.  [place] [phone]  Other Instructions  Viral Gastroenteritis, Adult  Viral gastroenteritis is also  known as the stomach flu. This condition may affect your stomach, small intestine, and large intestine. It can cause sudden watery diarrhea, fever, and vomiting. This condition is caused by many different viruses. These viruses can be passed from person to person very easily (are contagious). Diarrhea and vomiting can make you feel weak and cause you to become dehydrated. You may not be able to keep fluids down. Dehydration can make you tired and thirsty, cause you to have a dry mouth, and decrease how often you urinate. It is important to replace the fluids that you lose from diarrhea and  vomiting. What are the causes? Gastroenteritis is caused by many viruses, including rotavirus and norovirus. Norovirus is the most common cause in adults. You can get sick after being exposed to the viruses from other people. You can also get sick by: Eating food, drinking water, or touching a surface contaminated with one of these viruses. Sharing utensils or other personal items with an infected person. What increases the risk? You are more likely to develop this condition if you: Have a weak body defense system (immune system). Live with one or more children who are younger than 2 years. Live in a nursing home. Travel on cruise ships. What are the signs or symptoms? Symptoms of this condition start suddenly 1-3 days after exposure to a virus. Symptoms may last for a few days or for as long as a week. Common symptoms include watery diarrhea and vomiting. Other symptoms include: Fever. Headache. Fatigue. Pain in the abdomen. Chills. Weakness. Nausea. Muscle aches. Loss of appetite. How is this diagnosed? This condition is diagnosed with a medical history and physical exam. You may also have a stool test to check for viruses or other infections. How is this treated? This condition typically goes away on its own. The focus of treatment is to prevent dehydration and restore lost fluids (rehydration). This condition may be treated with: An oral rehydration solution (ORS) to replace important salts and minerals (electrolytes) in your body. Take this if told by your health care provider. This is a drink that is sold at pharmacies and retail stores. Medicines to help with your symptoms. Probiotic supplements to reduce symptoms of diarrhea. Fluids given through an IV, if dehydration is severe. Older adults and people with other diseases or a weak immune system are at higher risk for dehydration. Follow these instructions at home: Eating and drinking  Take an ORS as told by your health care  provider. Drink clear fluids in small amounts as you are able. Clear fluids include: Water. Ice chips. Diluted fruit juice. Low-calorie sports drinks. Drink enough fluid to keep your urine pale yellow. Eat small amounts of healthy foods every 3-4 hours as you are able. This may include whole grains, fruits, vegetables, lean meats, and yogurt. Avoid fluids that contain a lot of sugar or caffeine, such as energy drinks, sports drinks, and soda. Avoid spicy or fatty foods. Avoid alcohol. General instructions  Wash your hands often, especially after having diarrhea or vomiting. If soap and water are not available, use hand sanitizer. Make sure that all people in your household wash their hands well and often. Take over-the-counter and prescription medicines only as told by your health care provider. Rest at home while you recover. Watch your condition for any changes. Take a warm bath to relieve any burning or pain from frequent diarrhea episodes. Keep all follow-up visits. This is important. Contact a health care provider if you: Cannot keep fluids down.  Have symptoms that get worse. Have new symptoms. Feel light-headed or dizzy. Have muscle cramps. Get help right away if you: Have chest pain. Have trouble breathing or you are breathing very quickly. Have a fast heartbeat. Feel extremely weak or you faint. Have a severe headache, a stiff neck, or both. Have a rash. Have severe pain, cramping, or bloating in your abdomen. Have skin that feels cold and clammy. Feel confused. Have pain when you urinate. Have signs of dehydration, such as: Dark urine, very little urine, or no urine. Cracked lips. Dry mouth. Sunken eyes. Sleepiness. Weakness. Have signs of bleeding, such as: Seeing blood in your vomit. Having vomit that looks like coffee grounds. Having bloody or black stools or stools that look like tar. These symptoms may be an emergency. Get help right away. Call 911. Do  not wait to see if the symptoms will go away. Do not drive yourself to the hospital. Summary Viral gastroenteritis is also known as the stomach flu. It can cause sudden watery diarrhea, fever, and vomiting. This condition can be passed from person to person very easily (is contagious). Take an oral rehydration solution (ORS) if told by your health care provider. This is a drink that is sold at pharmacies and retail stores. Wash your hands often, especially after having diarrhea or vomiting. If soap and water are not available, use hand sanitizer. This information is not intended to replace advice given to you by your health care provider. Make sure you discuss any questions you have with your health care provider. Document Revised: 10/05/2021 Document Reviewed: 10/05/2021 Elsevier Patient Education  [place].    If you have been instructed to have an in-person evaluation today at a local Urgent Care facility, please use the link below. It will take you to a list of all of our available [facility], including address, phone number and hours of operation. Please do not delay care.  [facility]  If you or a family member do not have a primary care provider, use the link below to schedule a visit and establish care. When you choose a [facility] primary care physician or advanced practice provider, you gain a long-term partner in health. Find a Primary Care Provider  Learn more about [facility] in-office and virtual care options: [facility]

## 2022-09-23 NOTE — Progress Notes (Signed)
Virtual Visit Consent   Stephanie Gray, you are scheduled for a virtual visit with a [facility] provider today. Just as with appointments in the office, your consent must be obtained to participate. Your consent will be active for this visit and any virtual visit you may have with one of our providers in the next 365 days. If you have a MyChart account, a copy of this consent can be sent to you electronically.  As this is a virtual visit, video technology does not allow for your provider to perform a traditional examination. This may limit your provider's ability to fully assess your condition. If your provider identifies any concerns that need to be evaluated in person or the need to arrange testing (such as labs, EKG, etc.), we will make arrangements to do so. Although advances in technology are sophisticated, we cannot ensure that it will always work on either your end or our end. If the connection with a video visit is poor, the visit may have to be switched to a telephone visit. With either a video or telephone visit, we are not always able to ensure that we have a secure connection.  By engaging in this virtual visit, you consent to the provision of healthcare and authorize for your insurance to be billed (if applicable) for the services provided during this visit. Depending on your insurance coverage, you may receive a charge related to this service.  I need to obtain your verbal consent now. Are you willing to proceed with your visit today? Stephanie Gray has provided verbal consent on 09/23/2022 for a virtual visit (video or telephone). Mar Daring, PA-C  Date: 09/23/2022 5:12 PM  Virtual Visit via Video Note   I, Mar Daring, connected with  Stephanie Gray  ([id number], 1966/04/23) on 09/23/22 at  5:00 PM EDT by a video-enabled telemedicine application and verified that I am speaking with the correct person using two identifiers.  Location: Patient:  Virtual Visit Location Patient: Home Provider: Virtual Visit Location Provider: Home Office   I discussed the limitations of evaluation and management by telemedicine and the availability of in person appointments. The patient expressed understanding and agreed to proceed.    History of Present Illness: Stephanie Gray is a 56 y.o. who identifies as a female who was assigned female at birth, and is being seen today for nausea and vomiting.  HPI: Emesis  This is a new problem. The current episode started yesterday. The problem occurs more than 10 times per day. The problem has been gradually worsening. The emesis has an appearance of stomach contents and bile. There has been no fever. Associated symptoms include chills. Pertinent negatives include no diarrhea, fever, myalgias or sweats. Associated symptoms comments: Indigestion. Risk factors include travel to endemic area (did travel to [place], got back Monday night; was not in any areas that have high incidence of bacteria in water). She has tried increased fluids (tums) for the symptoms. The treatment provided no relief.     Problems:  Patient Active Problem List   Diagnosis Date Noted   Multiple fractures of ribs, right side, initial encounter for closed fracture 01/08/2022   PVC's (premature ventricular contractions)    Atrial tachycardia, paroxysmal    Family history of premature CAD 11/21/2019   Family history of bicuspid aortic valve 11/21/2019   Hypothyroidism 03/17/2018   Menorrhagia 03/17/2018   Perimenopausal disorder 03/17/2018   Lightheadedness 03/17/2018   Neuromuscular disorder ([place])    Depression  Arthritis    Anxiety    Apocrine metaplasia of breast, right 03/03/2018   Breast mass, right 04/12/2017   Hypertension 06/28/2016   Routine general medical examination at a health care facility 04/06/2016   Elevated blood pressure reading without diagnosis of hypertension 06/12/2015   Tubular adenoma of colon 04/27/2015    Nonspecific abnormal results of thyroid function study 04/14/2009   DEPRESSION 04/14/2009   NONSPECIFIC ABNORMAL ELECTROCARDIOGRAM 04/14/2009    Allergies:  Allergies  Allergen Reactions   Benzodiazepines Other (See Comments)    Patient needed crash cart after tubal ligation   Other Other (See Comments)    Patient is extremely sensitive to all medicines, maybe needs 1/4 or 1/8 dosage of normal adult   Augmentin [Amoxicillin-Pot Clavulanate] Other (See Comments)    Shaky chills   Lamictal [Lamotrigine] Rash   Penicillins Rash    Rash because of a history of documented adverse serious drug reaction;Medi Alert bracelet  is recommended (CHILDHOOD REACTION) Has patient had a PCN reaction causing immediate rash, facial/tongue/throat swelling, SOB or lightheadedness with hypotension:UNKNOWN Has patient had a PCN reaction causing severe rash involving mucus membranes or skin necrosis:unsure Has patient had a PCN reaction that required hospitalization:unsure Has patient had a PCN reaction occurring within the last 10 years:unsure    Medications:  Current Outpatient Medications:    ondansetron (ZOFRAN-ODT) 8 MG disintegrating tablet, Take 1 tablet (8 mg total) by mouth every 8 (eight) hours as needed for nausea or vomiting., Disp: 20 tablet, Rfl: 0   acetaminophen (TYLENOL) 500 MG tablet, Take 2 tablets (1,000 mg total) by mouth every 6 (six) hours as needed for mild pain or fever., Disp: , Rfl:    albuterol (VENTOLIN HFA) 108 (90 Base) MCG/ACT inhaler, Inhale 2 puffs into the lungs every 6 (six) hours as needed for wheezing or shortness of breath., Disp: 8 g, Rfl: 0   amLODipine (NORVASC) 5 MG tablet, Take 5 mg by mouth daily., Disp: , Rfl:    docusate sodium (COLACE) 100 MG capsule, Take 1 capsule (100 mg total) by mouth daily as needed for mild constipation., Disp: , Rfl:    DULoxetine (CYMBALTA) 60 MG capsule, Take 1 capsule (60 mg total) by mouth daily., Disp: 90 capsule, Rfl: 1    ibuprofen (ADVIL) 600 MG tablet, Take 1 tablet (600 mg total) by mouth 3 (three) times daily. (Patient not taking: Reported on 05/03/2022), Disp: 30 tablet, Rfl: 0   lidocaine (LIDODERM) 5 %, Place 1 patch onto the skin daily. Remove & Discard patch within 12 hours or as directed by MD (Patient not taking: Reported on 05/03/2022), Disp: 30 patch, Rfl: 0   methocarbamol (ROBAXIN) 500 MG tablet, Take 2 tablets (1,000 mg total) by mouth every 6 (six) hours as needed for muscle spasms. (Patient not taking: Reported on 05/03/2022), Disp: 60 tablet, Rfl: 0   polyethylene glycol (MIRALAX / GLYCOLAX) 17 g packet, Take 17 g by mouth daily as needed for mild constipation., Disp: , Rfl:    traMADol (ULTRAM) 50 MG tablet, Take 1-2 tablets (50-100 mg total) by mouth every 6 (six) hours as needed for moderate pain or severe pain (50 mg for moderate pain, 100 mg for severe pain). (Patient not taking: Reported on 05/03/2022), Disp: 30 tablet, Rfl: 0   Trolamine Salicylate (BLUE-EMU HEMP EX), Take 1 tablet by mouth daily. Hemp muscadine, Disp: , Rfl:    Turmeric (QC TUMERIC COMPLEX PO), Take 1 tablet by mouth daily., Disp: , Rfl:  valsartan (DIOVAN) 160 MG tablet, TAKE ONE TABLET BY MOUTH EVERY DAY, Disp: 30 tablet, Rfl: 0  Observations/Objective: Patient is well-developed, well-nourished in no acute distress.  Resting comfortably at home.  Head is normocephalic, atraumatic.  No labored breathing.  Speech is clear and coherent with logical content.  Patient is alert and oriented at baseline.    Assessment and Plan: 1. Viral gastroenteritis - ondansetron (ZOFRAN-ODT) 8 MG disintegrating tablet; Take 1 tablet (8 mg total) by mouth every 8 (eight) hours as needed for nausea or vomiting.  Dispense: 20 tablet; Refill: 0  - Suspect viral gastroenteritis - Zofran for nausea - Push fluids, electrolyte beverages - Liquid diet, then increase to soft/bland (BRAT) diet over next day, then increase diet as tolerated -  Seek in person evaluation if not improving or symptoms worsen   Follow Up Instructions: I discussed the assessment and treatment plan with the patient. The patient was provided an opportunity to ask questions and all were answered. The patient agreed with the plan and demonstrated an understanding of the instructions.  A copy of instructions were sent to the patient via MyChart unless otherwise noted below.    The patient was advised to call back or seek an in-person evaluation if the symptoms worsen or if the condition fails to improve as anticipated.  Time:  I spent 12 minutes with the patient via telehealth technology discussing the above problems/concerns.    Mar Daring, PA-C

## 2022-09-25 ENCOUNTER — Encounter (HOSPITAL_BASED_OUTPATIENT_CLINIC_OR_DEPARTMENT_OTHER): Payer: Self-pay | Admitting: Emergency Medicine

## 2022-09-25 ENCOUNTER — Other Ambulatory Visit: Payer: Self-pay

## 2022-09-25 ENCOUNTER — Emergency Department (HOSPITAL_BASED_OUTPATIENT_CLINIC_OR_DEPARTMENT_OTHER)
Admission: EM | Admit: 2022-09-25 | Discharge: 2022-09-26 | Disposition: A | Discharge status: Home / Self Care | Payer: BC Managed Care – PPO | Attending: Emergency Medicine | Admitting: Emergency Medicine

## 2022-09-25 DIAGNOSIS — R109 Unspecified abdominal pain: Secondary | ICD-10-CM | POA: Diagnosis not present

## 2022-09-25 DIAGNOSIS — R000 Tachycardia, unspecified: Secondary | ICD-10-CM | POA: Insufficient documentation

## 2022-09-25 DIAGNOSIS — T383X5A Adverse effect of insulin and oral hypoglycemic [antidiabetic] drugs, initial encounter: Secondary | ICD-10-CM | POA: Diagnosis not present

## 2022-09-25 DIAGNOSIS — R112 Nausea with vomiting, unspecified: Secondary | ICD-10-CM | POA: Diagnosis not present

## 2022-09-25 DIAGNOSIS — I10 Essential (primary) hypertension: Secondary | ICD-10-CM | POA: Diagnosis not present

## 2022-09-25 DIAGNOSIS — Z8616 Personal history of COVID-19: Secondary | ICD-10-CM | POA: Diagnosis not present

## 2022-09-25 LAB — CBC
HCT: 45.7 % (ref 36.0–46.0)
Hemoglobin: 15.8 g/dL — ABNORMAL HIGH (ref 12.0–15.0)
MCH: 28.7 pg (ref 26.0–34.0)
MCHC: 34.6 g/dL (ref 30.0–36.0)
MCV: 83.1 fL (ref 80.0–100.0)
Platelets: 425 10*3/uL — ABNORMAL HIGH (ref 150–400)
RBC: 5.5 MIL/uL — ABNORMAL HIGH (ref 3.87–5.11)
RDW: 13 % (ref 11.5–15.5)
WBC: 17.8 10*3/uL — ABNORMAL HIGH (ref 4.0–10.5)
nRBC: 0 % (ref 0.0–0.2)

## 2022-09-25 LAB — LIPASE, BLOOD: Lipase: 29 U/L (ref 11–51)

## 2022-09-25 LAB — DIFFERENTIAL
Abs Immature Granulocytes: 0.06 10*3/uL (ref 0.00–0.07)
Basophils Absolute: 0.1 10*3/uL (ref 0.0–0.1)
Basophils Relative: 0 %
Eosinophils Absolute: 0.1 10*3/uL (ref 0.0–0.5)
Eosinophils Relative: 1 %
Immature Granulocytes: 0 %
Lymphocytes Relative: 19 %
Lymphs Abs: 3.3 10*3/uL (ref 0.7–4.0)
Monocytes Absolute: 1.3 10*3/uL — ABNORMAL HIGH (ref 0.1–1.0)
Monocytes Relative: 7 %
Neutro Abs: 12.6 10*3/uL — ABNORMAL HIGH (ref 1.7–7.7)
Neutrophils Relative %: 73 %

## 2022-09-25 LAB — COMPREHENSIVE METABOLIC PANEL
ALT: 22 U/L (ref 0–44)
AST: 18 U/L (ref 15–41)
Albumin: 5 g/dL (ref 3.5–5.0)
Alkaline Phosphatase: 83 U/L (ref 38–126)
Anion gap: 16 — ABNORMAL HIGH (ref 5–15)
BUN: 18 mg/dL (ref 6–20)
CO2: 24 mmol/L (ref 22–32)
Calcium: 10.4 mg/dL — ABNORMAL HIGH (ref 8.9–10.3)
Chloride: 98 mmol/L (ref 98–111)
Creatinine, Ser: 1.29 mg/dL — ABNORMAL HIGH (ref 0.44–1.00)
GFR, Estimated: 48 mL/min — ABNORMAL LOW (ref >60)
Glucose, Bld: 121 mg/dL — ABNORMAL HIGH (ref 70–99)
Potassium: 3.2 mmol/L — ABNORMAL LOW (ref 3.5–5.1)
Sodium: 138 mmol/L (ref 135–145)
Total Bilirubin: 0.6 mg/dL (ref 0.3–1.2)
Total Protein: 7.8 g/dL (ref 6.5–8.1)

## 2022-09-25 LAB — TROPONIN I (HIGH SENSITIVITY): Troponin I (High Sensitivity): 3 ng/L (ref <18)

## 2022-09-25 MED ORDER — SODIUM CHLORIDE 0.9 % IV BOLUS
1000.0000 mL | Freq: Once | INTRAVENOUS | Status: AC
  Administered 2022-09-25: 1000 mL via INTRAVENOUS

## 2022-09-25 MED ORDER — ONDANSETRON HCL 4 MG/2ML IJ SOLN
4.0000 mg | Freq: Once | INTRAMUSCULAR | Status: AC
  Administered 2022-09-25: 4 mg via INTRAVENOUS
  Filled 2022-09-25: qty 2

## 2022-09-25 MED ORDER — POTASSIUM CHLORIDE CRYS ER 20 MEQ PO TBCR
40.0000 meq | EXTENDED_RELEASE_TABLET | Freq: Once | ORAL | Status: AC
Start: 2022-09-25 — End: 2022-09-25
  Administered 2022-09-25: 40 meq via ORAL
  Filled 2022-09-25: qty 2

## 2022-09-25 MED ORDER — PANTOPRAZOLE SODIUM 40 MG IV SOLR
40.0000 mg | Freq: Once | INTRAVENOUS | Status: AC
  Administered 2022-09-25: 40 mg via INTRAVENOUS
  Filled 2022-09-25: qty 10

## 2022-09-25 NOTE — ED Triage Notes (Addendum)
-+  Pt presents to ED POV. Pt c/o emesis and HA since Wednesday-. Pt reports that zofran helps but she's still not able to hold down anything PO. Reports she began taking ozempic on Wednesday.

## 2022-09-25 NOTE — ED Provider Notes (Incomplete)
[facility] Provider Note: Stephanie Spurling, MD, FACEP  CSN: [id number] MRN: [id number] ARRIVAL: 09/25/22 at 2113 ROOM: DB012/DB012   CHIEF COMPLAINT  Vomiting   HISTORY OF PRESENT ILLNESS  09/25/22 11:08 PM Stephanie Gray is a 56 y.o. female with 3 days of nausea, vomiting and headache.  She had started Ozempic on the same day.  Zofran usually helps her when she is nauseated but she has not been able to keep anything down.  She rates her headache as an 8 out of 10.  She was noted to be tachycardic on arrival with a heart rate of 129 but this and the headache resolved with a 1 L normal saline bolus.  She denies associated diarrhea but states her abdomen is having some discomfort which she attributes to the vomiting.  She is not having chest pain.   Past Medical History:  Diagnosis Date   Anemia    after c section   Anxiety    Apocrine metaplasia of breast, right 03/03/2018   Arthritis    Atrial tachycardia, paroxysmal    noted on event monitor 02/2020   Breast mass, right 04/12/2017   Cervical disc disease    Complication of anesthesia    very sensitive to all mdications,needs lower dosage   Depression    History of COVID-19    History of HELLP syndrome, currently pregnant, third trimester    Hypertension    hasnt taken medication since 01/09/2022 because bp was low   Hypothyroidism 03/17/2018   pr denies   Neuromuscular disorder (HCC)    numbness in 2 fingers of left hand   Perimenopausal disorder 03/17/2018   Premature atrial contractions    PVC's (premature ventricular contractions)    noted on heart monitor 02/2020   Rib fracture 01/08/2022   right second, third and fourth ribs   Routine general medical examination at a health care facility 04/06/2016   Tubular adenoma of colon 04/27/2015   04/07/2015 @ 70 cm ,polyp; Dr Collene Mares Due 2019     Past Surgical History:  Procedure Laterality Date   APPENDECTOMY     BLADDER REPAIR     BREAST LUMPECTOMY WITH  RADIOACTIVE SEED LOCALIZATION Right 02/10/2017   Procedure: RIGHT BREAST LUMPECTOMY WITH RADIOACTIVE SEED LOCALIZATION;  Surgeon: Autumn Messing III, MD;  Location: [place];  Service: General;  Laterality: Right;   CESAREAN SECTION     X 2, Dr Reino Kent   CHOLECYSTECTOMY N/A 07/11/2020   Procedure: LAPAROSCOPIC CHOLECYSTECTOMY;  Surgeon: Donnie Mesa, MD;  Location: [place];  Service: General;  Laterality: N/A;   EYE SURGERY Bilateral    Lasik    ORIF CLAVICULAR FRACTURE Right 01/15/2022   Procedure: OPEN REDUCTION INTERNAL FIXATION (ORIF) CLAVICULAR FRACTURE;  Surgeon: Netta Cedars, MD;  Location: [facility];  Service: Orthopedics;  Laterality: Right;   TUBAL LIGATION     bladder laceration repair    Family History  Problem Relation Age of Onset   Hypertension Mother    Other Mother        congenital heart defect - bicuspid aortic valve   Diabetes Father    Heart disease Father    Hypertension Father    Stroke Maternal Grandfather 2       dementia   Cancer Paternal Grandfather        testicular   Mitral valve prolapse Maternal Grandmother    Heart disease Paternal Grandmother    Breast cancer Paternal Grandmother    Other Sister  neurologic issue    Social History   Tobacco Use   Smoking status: Never   Smokeless tobacco: Never  Vaping Use   Vaping Use: Never used  Substance Use Topics   Alcohol use: Yes    Comment: Rarely   Drug use: No    Prior to Admission medications   Medication Sig Start Date End Date Taking? Authorizing Provider  acetaminophen (TYLENOL) 500 MG tablet Take 2 tablets (1,000 mg total) by mouth every 6 (six) hours as needed for mild pain or fever. 01/11/22   Norm Parcel, PA-C  albuterol (VENTOLIN HFA) 108 (90 Base) MCG/ACT inhaler Inhale 2 puffs into the lungs every 6 (six) hours as needed for wheezing or shortness of breath. 12/21/21   Carvel Getting, NP  amLODipine (NORVASC) 5 MG tablet Take 5 mg by mouth daily.    [provider]  docusate  sodium (COLACE) 100 MG capsule Take 1 capsule (100 mg total) by mouth daily as needed for mild constipation. 01/11/22   Norm Parcel, PA-C  DULoxetine (CYMBALTA) 60 MG capsule Take 1 capsule (60 mg total) by mouth daily. 05/03/22   Arfeen, Arlyce Harman, MD  ondansetron (ZOFRAN-ODT) 8 MG disintegrating tablet Take 1 tablet (8 mg total) by mouth every 8 (eight) hours as needed for nausea or vomiting. 09/23/22   Mar Daring, PA-C  polyethylene glycol (MIRALAX / GLYCOLAX) 17 g packet Take 17 g by mouth daily as needed for mild constipation. 01/11/22   Norm Parcel, PA-C  Trolamine Salicylate (BLUE-EMU HEMP EX) Take 1 tablet by mouth daily. Hemp muscadine    [provider]  Turmeric (QC TUMERIC COMPLEX PO) Take 1 tablet by mouth daily.    [provider]  valsartan (DIOVAN) 160 MG tablet TAKE ONE TABLET BY MOUTH EVERY DAY 08/27/22   Marrian Salvage, FNP    Allergies Benzodiazepines, Other, Augmentin [amoxicillin-pot clavulanate], Lamictal [lamotrigine], and Penicillins   REVIEW OF SYSTEMS  Negative except as noted here or in the History of Present Illness.   PHYSICAL EXAMINATION  Initial Vital Signs Blood pressure 133/87, pulse 92, temperature 97.9 F (36.6 C), resp. rate 13, last menstrual period 06/19/2020, SpO2 100 %.  Examination General: Well-developed, well-nourished female in no acute distress; appearance consistent with age of record HENT: normocephalic; atraumatic Eyes: Normal appearance Neck: supple Heart: regular rate and rhythm Lungs: clear to auscultation bilaterally Abdomen: soft; nondistended; nontender; bowel sounds present Extremities: No deformity; full range of motion; pulses normal Neurologic: Awake, alert and oriented; motor function intact in all extremities and symmetric; no facial droop Skin: Warm and dry Psychiatric: Normal mood and affect   RESULTS  Summary of this visit's results, reviewed and interpreted by myself:   EKG  Interpretation  Date/Time:  Saturday September 25 2022 21:55:13 EDT Ventricular Rate:  93 PR Interval:  140 QRS Duration: 80 QT Interval:  330 QTC Calculation: 411 R Axis:   108 Text Interpretation: Sinus rhythm Probable left atrial enlargement Right axis deviation Borderline repolarization abnormality ST depressions more pronounced Confirmed by ,  [phone]) on 09/25/2022 11:11:26 PM       Laboratory Studies: Results for orders placed or performed during the hospital encounter of 09/25/22 (from the past 24 hour(s))  Lipase, blood     Status: None   Collection Time: 09/25/22  9:51 PM  Result Value Ref Range   Lipase 29 11 - 51 U/L  Comprehensive metabolic panel     Status: Abnormal   Collection Time:  09/25/22  9:51 PM  Result Value Ref Range   Sodium 138 135 - 145 mmol/L   Potassium 3.2 (L) 3.5 - 5.1 mmol/L   Chloride 98 98 - 111 mmol/L   CO2 24 22 - 32 mmol/L   Glucose, Bld 121 (H) 70 - 99 mg/dL   BUN 18 6 - 20 mg/dL   Creatinine, Ser 1.29 (H) 0.44 - 1.00 mg/dL   Calcium 10.4 (H) 8.9 - 10.3 mg/dL   Total Protein 7.8 6.5 - 8.1 g/dL   Albumin 5.0 3.5 - 5.0 g/dL   AST 18 15 - 41 U/L   ALT 22 0 - 44 U/L   Alkaline Phosphatase 83 38 - 126 U/L   Total Bilirubin 0.6 0.3 - 1.2 mg/dL   GFR, Estimated 48 (L) >60 mL/min   Anion gap 16 (H) 5 - 15  CBC     Status: Abnormal   Collection Time: 09/25/22  9:51 PM  Result Value Ref Range   WBC 17.8 (H) 4.0 - 10.5 K/uL   RBC 5.50 (H) 3.87 - 5.11 MIL/uL   Hemoglobin 15.8 (H) 12.0 - 15.0 g/dL   HCT 45.7 36.0 - 46.0 %   MCV 83.1 80.0 - 100.0 fL   MCH 28.7 26.0 - 34.0 pg   MCHC 34.6 30.0 - 36.0 g/dL   RDW 13.0 11.5 - 15.5 %   Platelets 425 (H) 150 - 400 K/uL   nRBC 0.0 0.0 - 0.2 %  Troponin I (High Sensitivity)     Status: None   Collection Time: 09/25/22  9:51 PM  Result Value Ref Range   Troponin I (High Sensitivity) 3 <18 ng/L  Differential     Status: Abnormal   Collection Time: 09/25/22  9:51 PM  Result Value Ref Range    Neutrophils Relative % 73 %   Neutro Abs 12.6 (H) 1.7 - 7.7 K/uL   Lymphocytes Relative 19 %   Lymphs Abs 3.3 0.7 - 4.0 K/uL   Monocytes Relative 7 %   Monocytes Absolute 1.3 (H) 0.1 - 1.0 K/uL   Eosinophils Relative 1 %   Eosinophils Absolute 0.1 0.0 - 0.5 K/uL   Basophils Relative 0 %   Basophils Absolute 0.1 0.0 - 0.1 K/uL   Immature Granulocytes 0 %   Abs Immature Granulocytes 0.06 0.00 - 0.07 K/uL  Urinalysis, Routine w reflex microscopic Urine, Clean Catch     Status: Abnormal   Collection Time: 09/26/22 12:50 AM  Result Value Ref Range   Color, Urine YELLOW YELLOW   APPearance CLEAR CLEAR   Specific Gravity, Urine 1.010 1.005 - 1.030   pH 7.0 5.0 - 8.0   Glucose, UA NEGATIVE NEGATIVE mg/dL   Hgb urine dipstick NEGATIVE NEGATIVE   Bilirubin Urine NEGATIVE NEGATIVE   Ketones, ur 15 (A) NEGATIVE mg/dL   Protein, ur NEGATIVE NEGATIVE mg/dL   Nitrite NEGATIVE NEGATIVE   Leukocytes,Ua NEGATIVE NEGATIVE   Imaging Studies: No results found.  ED COURSE and MDM  Nursing notes, initial and subsequent vitals signs, including pulse oximetry, reviewed and interpreted by myself.  Vitals:   09/25/22 2215 09/25/22 2315 09/26/22 0015 09/26/22 0100  BP: 133/87 132/85 135/76 125/81  Pulse: 92 94 86 (!) 103  Resp: '13 20 14 14  '$ Temp:      SpO2: 100% 96% 97% 99%   Medications  sodium chloride 0.9 % bolus 1,000 mL (0 mLs Intravenous Stopped 09/26/22 0104)  ondansetron (ZOFRAN) injection 4 mg (4 mg Intravenous Given 09/25/22 2321)  pantoprazole (PROTONIX) injection 40 mg (40 mg Intravenous Given 09/25/22 2327)  potassium chloride SA (KLOR-CON M) CR tablet 40 mEq (40 mEq Oral Given 09/25/22 2327)  metoCLOPramide (REGLAN) injection 10 mg (10 mg Intravenous Given 09/26/22 0036)   12:25 AM Patient still having some nausea.  We will give Reglan to supplement the Zofran.  Initial troponin normal.   1:09 AM Patient feeling much better would like to be discharged home.  Suspect this is an  adverse reaction to Ozempic as they were temporally related and nausea and vomiting are among the most common side effects of this drug.  She was shown the changes on her EKG (diffuse ST depressions prominent than previously) but she had no chest pain and her troponin is normal.  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  PROCEDURES  Procedures   ED DIAGNOSES     ICD-10-CM   1. Nausea and vomiting in adult  R11.2     2. Adverse reaction to antidiabetic drug, initial encounter  T38.3X5A          , Jenny Reichmann, MD 09/26/22 0122    Shanon Rosser, MD 09/26/22 8099

## 2022-09-26 LAB — URINALYSIS, ROUTINE W REFLEX MICROSCOPIC
Bilirubin Urine: NEGATIVE
Glucose, UA: NEGATIVE mg/dL
Hgb urine dipstick: NEGATIVE
Ketones, ur: 15 mg/dL — AB
Leukocytes,Ua: NEGATIVE
Nitrite: NEGATIVE
Protein, ur: NEGATIVE mg/dL
Specific Gravity, Urine: 1.01 (ref 1.005–1.030)
pH: 7 (ref 5.0–8.0)

## 2022-09-26 LAB — TROPONIN I (HIGH SENSITIVITY): Troponin I (High Sensitivity): 4 ng/L (ref <18)

## 2022-09-26 MED ORDER — METOCLOPRAMIDE HCL 5 MG/ML IJ SOLN
10.0000 mg | Freq: Once | INTRAMUSCULAR | Status: AC
  Administered 2022-09-26: 10 mg via INTRAVENOUS
  Filled 2022-09-26: qty 2

## 2022-09-26 NOTE — ED Notes (Signed)
UA

## 2022-10-18 ENCOUNTER — Other Ambulatory Visit: Payer: Self-pay | Admitting: Family

## 2022-10-18 NOTE — Telephone Encounter (Signed)
Appt tomorrow.

## 2022-10-19 ENCOUNTER — Ambulatory Visit (INDEPENDENT_AMBULATORY_CARE_PROVIDER_SITE_OTHER): Payer: BC Managed Care – PPO | Admitting: Family

## 2022-10-19 ENCOUNTER — Encounter: Payer: Self-pay | Admitting: Family

## 2022-10-19 VITALS — BP 108/62 | HR 88 | Temp 98.6°F | Ht 61.0 in | Wt 146.6 lb

## 2022-10-19 DIAGNOSIS — I10 Essential (primary) hypertension: Secondary | ICD-10-CM | POA: Diagnosis not present

## 2022-10-19 DIAGNOSIS — Z23 Encounter for immunization: Secondary | ICD-10-CM | POA: Diagnosis not present

## 2022-10-19 MED ORDER — AMLODIPINE BESYLATE-VALSARTAN 5-160 MG PO TABS: 1.0000 | ORAL_TABLET | Freq: Every day | ORAL | 3 refills | Status: DC

## 2022-10-19 NOTE — Progress Notes (Signed)
Stephanie Gray is a 56 y.o. female with the following history as recorded in [employer]:  Patient Active Problem List   Diagnosis Date Noted   Multiple fractures of ribs, right side, initial encounter for closed fracture 01/08/2022   PVC's (premature ventricular contractions)    Atrial tachycardia, paroxysmal    Family history of premature CAD 11/21/2019   Family history of bicuspid aortic valve 11/21/2019   Hypothyroidism 03/17/2018   Menorrhagia 03/17/2018   Perimenopausal disorder 03/17/2018   Lightheadedness 03/17/2018   Neuromuscular disorder ([place])    Depression    Arthritis    Anxiety    Apocrine metaplasia of breast, right 03/03/2018   Breast mass, right 04/12/2017   Hypertension 06/28/2016   Routine general medical examination at a health care facility 04/06/2016   Elevated blood pressure reading without diagnosis of hypertension 06/12/2015   Tubular adenoma of colon 04/27/2015   Nonspecific abnormal results of thyroid function study 04/14/2009   DEPRESSION 04/14/2009   NONSPECIFIC ABNORMAL ELECTROCARDIOGRAM 04/14/2009    Current Outpatient Medications  Medication Sig Dispense Refill   acetaminophen (TYLENOL) 500 MG tablet Take 2 tablets (1,000 mg total) by mouth every 6 (six) hours as needed for mild pain or fever.     amLODipine-valsartan (EXFORGE) 5-160 MG tablet Take 1 tablet by mouth daily. 90 tablet 3   B-12 METHYLCOBALAMIN PO Take by mouth.     cholecalciferol (VITAMIN D3) 25 MCG (1000 UNIT) tablet Take 1,000 Units by mouth daily.     co-enzyme Q-10 30 MG capsule Take 30 mg by mouth daily.     DULoxetine (CYMBALTA) 60 MG capsule Take 60 mg by mouth daily.     KRILL OIL PO Take by mouth. 1 capsule bid     Omega-3 Fatty Acids (FISH OIL) 435 MG CAPS Take by mouth.     polyethylene glycol (MIRALAX / GLYCOLAX) 17 g packet Take 17 g by mouth daily as needed for mild constipation.     progesterone (PROMETRIUM) 100 MG capsule Take 100 mg by mouth daily.      Trolamine Salicylate (BLUE-EMU HEMP EX) Take 1 tablet by mouth daily. Hemp muscadine     Turmeric (QC TUMERIC COMPLEX PO) Take 1 tablet by mouth daily.     No current facility-administered medications for this visit.    Allergies: Benzodiazepines, Other, Augmentin [amoxicillin-pot clavulanate], Ozempic (0.25 or 0.5 mg-dose) [semaglutide(0.25 or 0.64m-dos)], Lamictal [lamotrigine], and Penicillins  Past Medical History:  Diagnosis Date   Anemia    after c section   Anxiety    Apocrine metaplasia of breast, right 03/03/2018   Arthritis    Atrial tachycardia, paroxysmal    noted on event monitor 02/2020   Breast mass, right 04/12/2017   Cervical disc disease    Complication of anesthesia    very sensitive to all mdications,needs lower dosage   Depression    History of COVID-19    History of HELLP syndrome, currently pregnant, third trimester    Hypertension    hasnt taken medication since 01/09/2022 because bp was low   Hypothyroidism 03/17/2018   pr denies   Neuromuscular disorder (HCC)    numbness in 2 fingers of left hand   Perimenopausal disorder 03/17/2018   Premature atrial contractions    PVC's (premature ventricular contractions)    noted on heart monitor 02/2020   Rib fracture 01/08/2022   right second, third and fourth ribs   Routine general medical examination at a health care facility 04/06/2016   Tubular adenoma  of colon 04/27/2015   04/07/2015 @ 70 cm ,polyp; Dr Collene Mares Due 2019     Past Surgical History:  Procedure Laterality Date   APPENDECTOMY     BLADDER REPAIR     BREAST LUMPECTOMY WITH RADIOACTIVE SEED LOCALIZATION Right 02/10/2017   Procedure: RIGHT BREAST LUMPECTOMY WITH RADIOACTIVE SEED LOCALIZATION;  Surgeon: Autumn Messing III, MD;  Location: [place];  Service: General;  Laterality: Right;   CESAREAN SECTION     X 2, Dr Reino Kent   CHOLECYSTECTOMY N/A 07/11/2020   Procedure: LAPAROSCOPIC CHOLECYSTECTOMY;  Surgeon: Donnie Mesa, MD;  Location: [place];  Service:  General;  Laterality: N/A;   EYE SURGERY Bilateral    Lasik    ORIF CLAVICULAR FRACTURE Right 01/15/2022   Procedure: OPEN REDUCTION INTERNAL FIXATION (ORIF) CLAVICULAR FRACTURE;  Surgeon: Netta Cedars, MD;  Location[facility];  Service: Orthopedics;  Laterality: Right;   TUBAL LIGATION     bladder laceration repair    Family History  Problem Relation Age of Onset   Hypertension Mother    Other Mother        congenital heart defect - bicuspid aortic valve   Diabetes Father    Heart disease Father    Hypertension Father    Stroke Maternal Grandfather 16       dementia   Cancer Paternal Grandfather        testicular   Mitral valve prolapse Maternal Grandmother    Heart disease Paternal Grandmother    Breast cancer Paternal Grandmother    Other Sister        neurologic issue    Social History   Tobacco Use   Smoking status: Never   Smokeless tobacco: Never  Substance Use Topics   Alcohol use: Yes    Comment: Rarely    Subjective:  Patient presents for follow up on hypertension; has been doing well on combination Diovan and Amlodipine;  Has been working with Integrative provider and was doing very well with regimen prescribed except for Ozempic; had horrible reaction within 24 hours of first dosage;  Continuing to work with GYN and GI; scheduled with GI later this week;      Objective:  Vitals:   10/19/22 1327  BP: 108/62  Pulse: 88  Temp: 98.6 F (37 C)  TempSrc: Oral  SpO2: 98%  Weight: 146 lb 9.6 oz (66.5 kg)  Height: _0  (1.549 m)    General: Well developed, well nourished, in no acute distress  Skin : Warm and dry.  Head: Normocephalic and atraumatic  Eyes: Sclera and conjunctiva clear; pupils round and reactive to light; extraocular movements intact  Ears: External normal; canals clear; tympanic membranes normal  Oropharynx: Pink, supple. No suspicious lesions  Neck: Supple without thyromegaly, adenopathy  Lungs: Respirations unlabored; clear to  auscultation bilaterally without wheeze, rales, rhonchi  [facility] exam: normal rate and regular rhythm.  Neurologic: Alert and oriented; speech intact; face symmetrical; moves all extremities well; CNII-XII intact without focal deficit   Assessment:  1. Primary hypertension   2. Need for immunization against influenza     Plan:  Stable; change to Exforge 160/5; update EKG- shows sinus rhythm; check CBC, CMP today; follow up in 1 year, sooner prn.  Continue with GYN, GI and integrative medicine providers;   Return in about 1 year (around 10/20/2023).  Orders Placed This Encounter  Procedures   Flu Vaccine QUAD 62moIM (Fluarix, Fluzone & Alfiuria Quad PF)   CBC with Differential/Platelet   Comp Met (  CMET)   EKG 12-Lead    Requested Prescriptions   Signed Prescriptions Disp Refills   amLODipine-valsartan (EXFORGE) 5-160 MG tablet 90 tablet 3    Sig: Take 1 tablet by mouth daily.

## 2022-10-20 LAB — CBC WITH DIFFERENTIAL/PLATELET
Basophils Absolute: 0.1 10*3/uL (ref 0.0–0.1)
Basophils Relative: 1.1 % (ref 0.0–3.0)
Eosinophils Absolute: 0.2 10*3/uL (ref 0.0–0.7)
Eosinophils Relative: 3.5 % (ref 0.0–5.0)
HCT: 39.2 % (ref 36.0–46.0)
Hemoglobin: 13 g/dL (ref 12.0–15.0)
Lymphocytes Relative: 34.8 % (ref 12.0–46.0)
Lymphs Abs: 2.4 10*3/uL (ref 0.7–4.0)
MCHC: 33.1 g/dL (ref 30.0–36.0)
MCV: 85.5 fl (ref 78.0–100.0)
Monocytes Absolute: 0.6 10*3/uL (ref 0.1–1.0)
Monocytes Relative: 8 % (ref 3.0–12.0)
Neutro Abs: 3.7 10*3/uL (ref 1.4–7.7)
Neutrophils Relative %: 52.6 % (ref 43.0–77.0)
Platelets: 281 10*3/uL (ref 150.0–400.0)
RBC: 4.59 Mil/uL (ref 3.87–5.11)
RDW: 13.5 % (ref 11.5–15.5)
WBC: 7 10*3/uL (ref 4.0–10.5)

## 2022-10-20 LAB — COMPREHENSIVE METABOLIC PANEL
ALT: 12 U/L (ref 0–35)
AST: 15 U/L (ref 0–37)
Albumin: 4.6 g/dL (ref 3.5–5.2)
Alkaline Phosphatase: 90 U/L (ref 39–117)
BUN: 16 mg/dL (ref 6–23)
CO2: 32 mEq/L (ref 19–32)
Calcium: 9.6 mg/dL (ref 8.4–10.5)
Chloride: 101 mEq/L (ref 96–112)
Creatinine, Ser: 0.66 mg/dL (ref 0.40–1.20)
GFR: 97.08 mL/min (ref >60.00)
Glucose, Bld: 85 mg/dL (ref 70–99)
Potassium: 4.1 mEq/L (ref 3.5–5.1)
Sodium: 140 mEq/L (ref 135–145)
Total Bilirubin: 0.3 mg/dL (ref 0.2–1.2)
Total Protein: 7 g/dL (ref 6.0–8.3)

## 2022-10-21 ENCOUNTER — Encounter: Payer: Self-pay | Admitting: Family

## 2022-10-28 DIAGNOSIS — E039 Hypothyroidism, unspecified: Secondary | ICD-10-CM | POA: Diagnosis not present

## 2022-10-28 DIAGNOSIS — I10 Essential (primary) hypertension: Secondary | ICD-10-CM | POA: Diagnosis not present

## 2022-10-28 DIAGNOSIS — K219 Gastro-esophageal reflux disease without esophagitis: Secondary | ICD-10-CM | POA: Diagnosis not present

## 2022-11-03 ENCOUNTER — Encounter (HOSPITAL_COMMUNITY): Payer: Self-pay | Admitting: Psychiatry

## 2022-11-03 ENCOUNTER — Telehealth (HOSPITAL_BASED_OUTPATIENT_CLINIC_OR_DEPARTMENT_OTHER): Payer: BC Managed Care – PPO | Admitting: Psychiatry

## 2022-11-03 VITALS — Wt 143.0 lb

## 2022-11-03 DIAGNOSIS — F330 Major depressive disorder, recurrent, mild: Secondary | ICD-10-CM

## 2022-11-03 DIAGNOSIS — F419 Anxiety disorder, unspecified: Secondary | ICD-10-CM

## 2022-11-03 MED ORDER — DULOXETINE HCL 60 MG PO CPEP: 60.0000 mg | ORAL_CAPSULE | Freq: Every day | ORAL | 1 refills | Status: DC

## 2022-11-03 NOTE — Progress Notes (Signed)
Virtual Visit via Telephone Note  I connected with Stephanie Gray on 11/03/22 at  2:20 PM EST by telephone and verified that I am speaking with the correct person using two identifiers.  Location: Patient: In Car Provider: Home Office   I discussed the limitations, risks, security and privacy concerns of performing an evaluation and management service by telephone and the availability of in person appointments. I also discussed with the patient that there may be a patient responsible charge related to this service. The patient expressed understanding and agreed to proceed.   History of Present Illness: Patient is evaluated by phone session.  She reported a very difficult time in past month or so.  She was prescribed Ozempic [facility] and she had a horrible side effects.  She was throwing up, nausea and seen by the provider in the emergency room for persistent nausea.  Finally she stopped Ozempic.  Patient told she has not taken the Cymbalta for 3 weeks when she was having nausea because she could not hold anything.  Now the nausea is better she is back on Cymbalta.  She admitted without the Cymbalta she was not doing well and having a lot of anxiety, irritability and depression.  Since she is back on Cymbalta things are much better.  Today she is anxious because her 58 year old mother who is in [facility] had a pneumonia and she is rushing to see her.  Patient also had a flulike symptoms.  One of her coworker who works in [place] is also sick.  She feels sometime overwhelmed but hoping things will get slowly and gradually better.  She had scheduled Thanksgiving with the family back not sure because.  The family members are sick and she does not want to take more chances to infect other people.  She is sleeping good.  She denies any feeling of hopelessness or worthlessness.  Her family life is good.  She lives with her husband who is very supportive.   Patient tried to spend more time at her barn.  Past Psychiatric History:  H/O depression and anxiety. Taking meds since her son  born.  Took Paxil, Zoloft and propanolol for anxiety.  Lamictal caused rash. No h/o inpatient or suicidal. H/O episodic hypomanic-like symptoms as more energy and poor sleep but able to function. No h/o paranoia and hallucination.  Saw Letta Moynahan for 10 years.     Psychiatric Specialty Exam: Physical Exam  Review of Systems  Weight 143 lb (64.9 kg), last menstrual period 06/19/2020.There is no height or weight on file to calculate BMI.  General Appearance: NA  Eye Contact:  NA  Speech:  Normal Rate  Volume:  Normal  Mood:  Anxious  Affect:  NA  Thought Process:  Goal Directed  Orientation:  Full (Time, Place, and Person)  Thought Content:  WDL  Suicidal Thoughts:  No  Homicidal Thoughts:  No  Memory:  Immediate;   Good Recent;   Good Remote;   Good  Judgement:  Intact  Insight:  Present  Psychomotor Activity:  NA  Concentration:  Concentration: Good and Attention Span: Good  Recall:  Good  Fund of Knowledge:  Good  Language:  Good  Akathisia:  No  Handed:  Right  AIMS (if indicated):     Assets:  Communication Skills Desire for Improvement Housing Resilience Social Support Talents/Skills Transportation  ADL's:  Intact  Cognition:  WNL  Sleep:   ok  Assessment and Plan: Major depressive disorder, recurrent.  Anxiety.  Patient was without Cymbalta after having persistent nausea, diarrhea and throwing up with the Ozempic.  Now she is off the Ozempic and things are better and she started taking Cymbalta for the past few weeks.  She is slowly and gradually better.  She was having a lot of anxiety and irritability and symptoms are getting better.  Patient wants to keep the Cymbalta 60 mg daily.  I recommend to give [place] a call back if symptoms do not improve.  Patient like to have a follow-up in 6 months.  Discussed medication side  effects and benefits.  Patient prefer her [facility] because she is not happy with [facility].  Follow Up Instructions:    I discussed the assessment and treatment plan with the patient. The patient was provided an opportunity to ask questions and all were answered. The patient agreed with the plan and demonstrated an understanding of the instructions.   The patient was advised to call back or seek an in-person evaluation if the symptoms worsen or if the condition fails to improve as anticipated.  Collaboration of Care: Other provider involved in patient's care AEB notes are available in epic to review.  Patient/Guardian was advised Release of Information must be obtained prior to any record release in order to collaborate their care with an outside provider. Patient/Guardian was advised if they have not already done so to contact the registration department to sign all necessary forms in order for [place] to release information regarding their care.   Consent: Patient/Guardian gives verbal consent for treatment and assignment of benefits for services provided during this visit. Patient/Guardian expressed understanding and agreed to proceed.    I provided 20 minutes of non-face-to-face time during this encounter.   Kathlee Nations, MD

## 2022-12-22 ENCOUNTER — Other Ambulatory Visit: Payer: BC Managed Care – PPO

## 2023-03-03 DIAGNOSIS — L570 Actinic keratosis: Secondary | ICD-10-CM | POA: Diagnosis not present

## 2023-03-03 DIAGNOSIS — D3612 Benign neoplasm of peripheral nerves and autonomic nervous system, upper limb, including shoulder: Secondary | ICD-10-CM | POA: Diagnosis not present

## 2023-03-03 DIAGNOSIS — L700 Acne vulgaris: Secondary | ICD-10-CM | POA: Diagnosis not present

## 2023-03-03 DIAGNOSIS — L821 Other seborrheic keratosis: Secondary | ICD-10-CM | POA: Diagnosis not present

## 2023-03-15 ENCOUNTER — Inpatient Hospital Stay: Payer: BC Managed Care – PPO

## 2023-03-15 ENCOUNTER — Inpatient Hospital Stay: Payer: BC Managed Care – PPO | Attending: Hematology and Oncology | Admitting: Genetic Counselor

## 2023-03-15 DIAGNOSIS — Z809 Family history of malignant neoplasm, unspecified: Secondary | ICD-10-CM | POA: Diagnosis not present

## 2023-03-15 DIAGNOSIS — Z803 Family history of malignant neoplasm of breast: Secondary | ICD-10-CM

## 2023-03-15 DIAGNOSIS — Z8041 Family history of malignant neoplasm of ovary: Secondary | ICD-10-CM | POA: Diagnosis not present

## 2023-03-15 LAB — GENETIC SCREENING ORDER

## 2023-03-16 ENCOUNTER — Encounter: Payer: Self-pay | Admitting: Genetic Counselor

## 2023-03-16 NOTE — Progress Notes (Signed)
REFERRING PROVIDER: Marrian Salvage, [address] [place],  [place] [address]  PRIMARY PROVIDER:  Marrian Salvage, FNP  PRIMARY REASON FOR VISIT:  Encounter Diagnoses  Name Primary?   Family history of cancer in mother Yes   Family history of breast cancer     HISTORY OF PRESENT ILLNESS:   Stephanie Gray, a 57 y.o. female, was seen for a [facility] cancer genetics consultation at the request of Dr. Valere Dross due to a family history of cancer.  Stephanie Gray presents to clinic today to discuss the possibility of a hereditary predisposition to cancer, to discuss genetic testing, and to further clarify her future cancer risks, as well as potential cancer risks for family members.   Stephanie Gray is a 57 y.o. female with no personal history of cancer.    RISK FACTORS:  Menarche was at age 64.  First live birth at age 60.  OCP use for approximately  30+  years.  Ovaries intact: yes.  Uterus intact: yes.  Menopausal status: postmenopausal, menopause at age 49  HRT use: 2 years. Colonoscopy: yes;  approximately 3 polyps, she is on a 5 year plan . Mammogram within the last year: yes.  Past Medical History:  Diagnosis Date   Anemia    after c section   Anxiety    Apocrine metaplasia of breast, right 03/03/2018   Arthritis    Atrial tachycardia, paroxysmal    noted on event monitor 02/2020   Breast mass, right 04/12/2017   Cervical disc disease    Complication of anesthesia    very sensitive to all mdications,needs lower dosage   Depression    History of COVID-19    History of HELLP syndrome, currently pregnant, third trimester    Hypertension    hasnt taken medication since 01/09/2022 because bp was low   Hypothyroidism 03/17/2018   pr denies   Neuromuscular disorder (HCC)    numbness in 2 fingers of left hand   Perimenopausal disorder 03/17/2018   Premature atrial contractions    PVC's (premature ventricular contractions)    noted on  heart monitor 02/2020   Rib fracture 01/08/2022   right second, third and fourth ribs   Routine general medical examination at a health care facility 04/06/2016   Tubular adenoma of colon 04/27/2015   04/07/2015 @ 70 cm ,polyp; Dr Collene Mares Due 2019     Past Surgical History:  Procedure Laterality Date   APPENDECTOMY     BLADDER REPAIR     BREAST LUMPECTOMY WITH RADIOACTIVE SEED LOCALIZATION Right 02/10/2017   Procedure: RIGHT BREAST LUMPECTOMY WITH RADIOACTIVE SEED LOCALIZATION;  Surgeon: Autumn Messing III, MD;  Location: [facility];  Service: General;  Laterality: Right;   CESAREAN SECTION     X 2, Dr Reino Kent   CHOLECYSTECTOMY N/A 07/11/2020   Procedure: LAPAROSCOPIC CHOLECYSTECTOMY;  Surgeon: Donnie Mesa, MD;  Location: [facility];  Service: General;  Laterality: N/A;   EYE SURGERY Bilateral    Lasik    ORIF CLAVICULAR FRACTURE Right 01/15/2022   Procedure: OPEN REDUCTION INTERNAL FIXATION (ORIF) CLAVICULAR FRACTURE;  Surgeon: Netta Cedars, MD;  Location: [facility];  Service: Orthopedics;  Laterality: Right;   TUBAL LIGATION     bladder laceration repair    Social History   Socioeconomic History   Marital status: Married    Spouse name: Not on file   Number of children: 2   Years of education: 65   Highest education level: Not on  file  Occupational History   Occupation: [employer]  Tobacco Use   Smoking status: Never   Smokeless tobacco: Never  Vaping Use   Vaping Use: Never used  Substance and Sexual Activity   Alcohol use: Yes    Comment: Rarely   Drug use: No   Sexual activity: Yes    Birth control/protection: Surgical    Comment: TBL  Other Topics Concern   Not on file  Social History Narrative   ** Merged History Encounter **       Fun: Ride horses. Denies abuse and feels safe at home.    Social Determinants of Health   Financial Resource Strain: Not on file  Food Insecurity: Not on file  Transportation Needs: Not on file  Physical Activity: Not on file  Stress: Not on  file  Social Connections: Not on file     FAMILY HISTORY:  We obtained a detailed, 4-generation family history.  Significant diagnoses are listed below: Family History  Problem Relation Age of Onset   Hypertension Mother    Other Mother        congenital heart defect - bicuspid aortic valve   Cancer Mother 25       fallopian tube cancer   Diabetes Father    Heart disease Father    Hypertension Father    Other Sister        neurologic issue   Mitral valve prolapse Maternal Grandmother    Stroke Maternal Grandfather 8       dementia   Heart disease Paternal Grandmother    Breast cancer Paternal Grandmother 45 - 63   Cancer Paternal Grandfather 34 - 79       testicular   Breast cancer Maternal Great-grandmother 29     Stephanie Gray's mother was recently diagnosed with fallopian tube cancer at age 106. Her maternal great grandmother was diagnosed with breast cancer at age 5, she is deceased. Stephanie Gray's paternal grandmother was diagnosed with breast cancer in her 59s, she is deceased. Her paternal grandfather was diagnosed with testicular cancer in his 68s, he died in his 46s. There is no reported Ashkenazi Jewish ancestry.  GENETIC COUNSELING ASSESSMENT: Stephanie Gray is a 56 y.o. female with a family history of cancer which is somewhat suggestive of a hereditary predisposition to cancer. We, therefore, discussed and recommended the following at today's visit.   DISCUSSION: We discussed that 5 - 10% of cancer is hereditary, with most cases of hereditary breast and fallopian tube cancer associated with BRCA1/2.  There are other genes that can be associated with hereditary cancer syndromes.  We discussed that testing is beneficial for several reasons, including knowing about other cancer risks, identifying potential screening and risk-reduction options that may be appropriate, and to understanding if other family members could be at risk for cancer and allowing them to undergo  genetic testing.  We reviewed the characteristics, features and inheritance patterns of hereditary cancer syndromes. We also discussed genetic testing, including the appropriate family members to test, the process of testing, insurance coverage and turn-around-time for results. We discussed the implications of a negative, positive, carrier and/or variant of uncertain significant result.   Stephanie Gray was offered a common hereditary cancer panel (48 genes) and an expanded pan-cancer panel (71 genes). Stephanie Gray was informed of the benefits and limitations of each panel, including that expanded pan-cancer panels contain several genes that do not have clear management guidelines at this point in time.  We also discussed  that as the number of genes included on a panel increases, the chances of variants of uncertain significance increases.  After considering the benefits and limitations of each gene panel, Stephanie Gray elected to have [facility] Multi-Cancer Panel.   The Multi-Cancer + RNA Panel offered by [facility] includes sequencing and/or deletion/duplication analysis of the following 70 genes:  AIP*, ALK, APC*, ATM*, AXIN2*, BAP1*, BARD1*, BLM*, BMPR1A*, BRCA1*, BRCA2*, BRIP1*, CDC73*, CDH1*, CDK4, CDKN1B*, CDKN2A, CHEK2*, CTNNA1*, DICER1*, EPCAM (del/dup only), EGFR, FH*, FLCN*, GREM1 (promoter dup only), HOXB13, KIT, LZTR1, MAX*, MBD4, MEN1*, MET, MITF, MLH1*, MSH2*, MSH3*, MSH6*, MUTYH*, NF1*, NF2*, NTHL1*, PALB2*, PDGFRA, PMS2*, POLD1*, POLE*, POT1*, PRKAR1A*, PTCH1*, PTEN*, RAD51C*, RAD51D*, RB1*, RET, SDHA* (sequencing only), SDHAF2*, SDHB*, SDHC*, SDHD*, SMAD4*, SMARCA4*, SMARCB1*, SMARCE1*, STK11*, SUFU*, TMEM127*, TP53*, TSC1*, TSC2*, VHL*. RNA analysis is performed for * genes.   Based on Stephanie Gray's family history of cancer, she meets medical criteria for genetic testing. Despite that she meets criteria, she may still have an out of pocket cost. We discussed that if her out of pocket cost  for testing is over $100, the laboratory will call and confirm whether she wants to proceed with testing.  If the out of pocket cost of testing is less than $100 she will be billed by the genetic testing laboratory.   We discussed that some people do not want to undergo genetic testing due to fear of genetic discrimination.  A federal law called the Genetic Information Non-Discrimination Act (GINA) of 2008 helps protect individuals against genetic discrimination based on their genetic test results.  It impacts both health insurance and employment.  With health insurance, it protects against increased premiums, being kicked off insurance or being forced to take a test in order to be insured.  For employment it protects against hiring, firing and promoting decisions based on genetic test results.  GINA does not apply to those in the [employer], those who work for companies with less than 15 employees, and new life insurance or long-term disability insurance policies.  Health status due to a cancer diagnosis is not protected under GINA.  PLAN: After considering the risks, benefits, and limitations, Stephanie Gray provided informed consent to pursue genetic testing and the blood sample was sent to [facility] for analysis of the Multi-Cancer Panel. Results should be available within approximately 2-3 weeks' time, at which point they will be disclosed by telephone to Stephanie Gray, as will any additional recommendations warranted by these results. Stephanie Gray will receive a summary of her genetic counseling visit and a copy of her results once available. This information will also be available in Epic.   Stephanie Gray's questions were answered to her satisfaction today. Our contact information was provided should additional questions or concerns arise. Thank you for the referral and allowing [place] to share in the care of your patient.   Lucille Passy, MS, [facility] Genetic  Counselor [email or web address] (P) [phone]  The patient was seen for a total of 30 minutes in face-to-face genetic counseling.  The patient brought her mother and sister. Drs. Lindi Adie and/or Burr Medico were available to discuss this case as needed.  _______________________________________________________________________ For Office Staff:  Number of people involved in session: 3 Was an Intern/ student involved with case: yes, [facility]

## 2023-03-21 ENCOUNTER — Inpatient Hospital Stay: Admission: RE | Admit: 2023-03-21 | Payer: BC Managed Care – PPO | Source: Ambulatory Visit

## 2023-03-21 ENCOUNTER — Other Ambulatory Visit: Payer: Self-pay | Admitting: Obstetrics and Gynecology

## 2023-03-21 DIAGNOSIS — E2839 Other primary ovarian failure: Secondary | ICD-10-CM

## 2023-03-30 ENCOUNTER — Encounter: Payer: Self-pay | Admitting: Genetic Counselor

## 2023-03-30 ENCOUNTER — Telehealth: Payer: Self-pay | Admitting: Genetic Counselor

## 2023-03-30 DIAGNOSIS — Z1379 Encounter for other screening for genetic and chromosomal anomalies: Secondary | ICD-10-CM | POA: Insufficient documentation

## 2023-03-30 NOTE — Telephone Encounter (Signed)
I contacted Ms. Ionescu to discuss her genetic testing results. No pathogenic variants were identified in the 70 genes analyzed. Detailed clinic note to follow.  The test report has been scanned into EPIC and is located under the Molecular Pathology section of the Results Review tab.  A portion of the result report is included below for reference.   Lalla Brothers, MS, [facility] Genetic Counselor [email or web address] (P) [phone]

## 2023-04-06 ENCOUNTER — Encounter: Payer: Self-pay | Admitting: Genetic Counselor

## 2023-04-06 ENCOUNTER — Ambulatory Visit: Payer: Self-pay | Admitting: Genetic Counselor

## 2023-04-06 DIAGNOSIS — Z1379 Encounter for other screening for genetic and chromosomal anomalies: Secondary | ICD-10-CM

## 2023-04-06 NOTE — Progress Notes (Signed)
HPI:   Ms. Crook was previously seen in the [facility] clinic due to a family history of ovarian cancer and concerns regarding a hereditary predisposition to cancer. Please refer to our prior cancer genetics clinic note for more information regarding our discussion, assessment and recommendations, at the time. Ms. Hagadorn's recent genetic test results were disclosed to her, as were recommendations warranted by these results. These results and recommendations are discussed in more detail below.  FAMILY HISTORY:  We obtained a detailed, 4-generation family history.  Significant diagnoses are listed below:      Family History  Problem Relation Age of Onset   Hypertension Mother     Other Mother          congenital heart defect - bicuspid aortic valve   Cancer Mother 28        fallopian tube cancer   Diabetes Father     Heart disease Father     Hypertension Father     Other Sister          neurologic issue   Mitral valve prolapse Maternal Grandmother     Stroke Maternal Grandfather 24        dementia   Heart disease Paternal Grandmother     Breast cancer Paternal Grandmother 66 - 59   Cancer Paternal Grandfather 58 - 79        testicular   Breast cancer Maternal Great-grandmother 55       Ms. Leist's mother was recently diagnosed with fallopian tube cancer at age 4. Her mother had negative [facility] Multi-Cancer Panel (70 genes). Her maternal great grandmother was diagnosed with breast cancer at age 26, she is deceased. Ms. Kley's paternal grandmother was diagnosed with breast cancer in her 65s, she is deceased. Her paternal grandfather was diagnosed with testicular cancer in his 73s, he died in his 89s. There is no reported Ashkenazi Jewish ancestry.   GENETIC TEST RESULTS:  The [facility] Multi-Cancer Panel found no pathogenic mutations.   The Multi-Cancer + RNA Panel offered by [facility] includes sequencing and/or deletion/duplication analysis of the  following 70 genes:  AIP*, ALK, APC*, ATM*, AXIN2*, BAP1*, BARD1*, BLM*, BMPR1A*, BRCA1*, BRCA2*, BRIP1*, CDC73*, CDH1*, CDK4, CDKN1B*, CDKN2A, CHEK2*, CTNNA1*, DICER1*, EPCAM (del/dup only), EGFR, FH*, FLCN*, GREM1 (promoter dup only), HOXB13, KIT, LZTR1, MAX*, MBD4, MEN1*, MET, MITF, MLH1*, MSH2*, MSH3*, MSH6*, MUTYH*, NF1*, NF2*, NTHL1*, PALB2*, PDGFRA, PMS2*, POLD1*, POLE*, POT1*, PRKAR1A*, PTCH1*, PTEN*, RAD51C*, RAD51D*, RB1*, RET, SDHA* (sequencing only), SDHAF2*, SDHB*, SDHC*, SDHD*, SMAD4*, SMARCA4*, SMARCB1*, SMARCE1*, STK11*, SUFU*, TMEM127*, TP53*, TSC1*, TSC2*, VHL*. RNA analysis is performed for * genes.   The test report has been scanned into EPIC and is located under the Molecular Pathology section of the Results Review tab.  A portion of the result report is included below for reference. Genetic testing reported out on 03/24/2023.      Even though a pathogenic variant was not identified, possible explanations for the cancer in the family may include: There may be no hereditary risk for cancer in the family. The cancers in  her family may be due to other genetic or environmental factors. There may be a gene mutation in one of these genes that current testing methods cannot detect, but that chance is small. There could be another gene that has not yet been discovered, or that we have not yet tested, that is responsible for the cancer diagnoses in the family.  It is also possible there is a hereditary cause for the  cancer in the family that Ms. Maruyama did not inherit.  Therefore, it is important to remain in touch with cancer genetics in the future so that we can continue to offer Ms. Bouie the most up to date genetic testing.     ADDITIONAL GENETIC TESTING:  We discussed with Ms. Rybarczyk that her genetic testing was fairly extensive.  If there are genes identified to increase cancer risk that can be analyzed in the future, we would be happy to discuss and coordinate this  testing at that time.    CANCER SCREENING RECOMMENDATIONS:  Ms. Brancato's test result is considered negative (normal).  This means that we have not identified a hereditary cause for her family history of cancer at this time. An individual's cancer risk and medical management are not determined by genetic test results alone. Overall cancer risk assessment incorporates additional factors, including personal medical history, family history, and any available genetic information that may result in a personalized plan for cancer prevention and surveillance. Therefore, it is recommended she continue to follow the cancer management and screening guidelines provided by her primary healthcare provider.  Breast Cancer Screening:  The Tyrer-Cuzick model is one of multiple prediction models developed to estimate an individual's lifetime risk of developing breast cancer. The Tyrer-Cuzick model is endorsed by the [employer] (NCCN). This model includes many risk factors such as family history, endogenous estrogen exposure, and benign breast disease. The calculation is highly-dependent on the accuracy of clinical data provided by the patient and can change over time. The Tyrer-Cuzick model may be repeated to reflect new information in her personal or family history in the future.   Ms. Roat's Tyrer-Cuzick risk score is 11.9%. She is encouraged to contact us regarding any changes to her personal or family history, as her recommendations for screening would be altered significantly if her lifetime risk is determined to be greater than 20% based on updated information.    RECOMMENDATIONS FOR FAMILY MEMBERS:   Since she did not inherit a mutation in a cancer predisposition gene included on this panel, her children could not have inherited a mutation from her in one of these genes.  FOLLOW-UP:  Cancer genetics is a rapidly advancing field and it is possible that new genetic tests will be  appropriate for her and/or her family members in the future. We encouraged her to remain in contact with cancer genetics so we can update her personal and family histories and let her know of advances in cancer genetics that may benefit this family.   Our contact number was provided. Ms. Banales's questions were answered to her satisfaction, and she knows she is welcome to call us at anytime with additional questions or concerns.   Lalla Brothers, MS, [facility] Genetic Counselor [email or web address] (P) [phone]

## 2023-05-02 DIAGNOSIS — E079 Disorder of thyroid, unspecified: Secondary | ICD-10-CM | POA: Diagnosis not present

## 2023-05-02 DIAGNOSIS — E88819 Insulin resistance, unspecified: Secondary | ICD-10-CM | POA: Diagnosis not present

## 2023-05-02 DIAGNOSIS — Z139 Encounter for screening, unspecified: Secondary | ICD-10-CM | POA: Diagnosis not present

## 2023-05-02 DIAGNOSIS — M359 Systemic involvement of connective tissue, unspecified: Secondary | ICD-10-CM | POA: Diagnosis not present

## 2023-05-02 DIAGNOSIS — R7309 Other abnormal glucose: Secondary | ICD-10-CM | POA: Diagnosis not present

## 2023-05-02 DIAGNOSIS — E7211 Homocystinuria: Secondary | ICD-10-CM | POA: Diagnosis not present

## 2023-05-02 DIAGNOSIS — R945 Abnormal results of liver function studies: Secondary | ICD-10-CM | POA: Diagnosis not present

## 2023-05-05 ENCOUNTER — Telehealth (HOSPITAL_COMMUNITY): Payer: BC Managed Care – PPO | Admitting: Psychiatry

## 2023-05-17 ENCOUNTER — Telehealth (HOSPITAL_BASED_OUTPATIENT_CLINIC_OR_DEPARTMENT_OTHER): Payer: BC Managed Care – PPO | Admitting: Psychiatry

## 2023-05-17 ENCOUNTER — Encounter (HOSPITAL_COMMUNITY): Payer: Self-pay | Admitting: Psychiatry

## 2023-05-17 VITALS — Wt 143.0 lb

## 2023-05-17 DIAGNOSIS — F330 Major depressive disorder, recurrent, mild: Secondary | ICD-10-CM

## 2023-05-17 DIAGNOSIS — F419 Anxiety disorder, unspecified: Secondary | ICD-10-CM | POA: Diagnosis not present

## 2023-05-17 MED ORDER — DULOXETINE HCL 60 MG PO CPEP: 60.0000 mg | ORAL_CAPSULE | Freq: Every day | ORAL | 1 refills | Status: DC

## 2023-05-17 NOTE — Progress Notes (Signed)
Stephanie Gray   Patient Location: In Car Provider Location: Home Office  I connect with patient by telephone and verified that I am speaking with correct person by using two identifiers. I discussed the limitations of evaluation and management by telemedicine and the availability of in person appointments. I also discussed with the patient that there may be a patient responsible charge related to this service. The patient expressed understanding and agreed to proceed.  Stephanie Gray [id number] 57 y.o.  05/17/2023 2:55 PM  History of Present Illness:  Patient is evaluated by phone session.  She is in the car and cannot do video session.  She is doing well.  She had a sad news that one of her best friend had pancreatic cancer but still going to biopsy definite diagnosis.  She is sad because she is very close to her friend and her kids are also very close to her.  Since weather is better and her parents are doing well she is now spending more time for horse riding and she feels great.  She is coming from her horse show.  She is more active and denies any crying spells or any feeling of hopelessness or worthlessness.  Her kids are doing very well.  She is taking Cymbalta that has been helping and working for her.  She denies any crying spells or any feeling of hopelessness or worthlessness.  She is pleased mother is doing very well and getting treatment from oncology.  She did stay with the mother when she was going through chemotherapy but now physically mother is doing very well and able to drive.  Patient lives with her husband who is very supportive.  She wants to continue Cymbalta since it is working well.  Past Psychiatric History: H/O depression and anxiety. Taking meds since her son  born.  Took Paxil, Zoloft and propanolol for anxiety.  Lamictal caused rash. No h/o inpatient or suicidal. H/O episodic hypomanic-like symptoms as more energy and poor  sleep but able to function. No h/o paranoia and hallucination.  Saw Stephanie Gray for 10 years.      Outpatient Encounter Medications as of 05/17/2023  Medication Sig   acetaminophen (TYLENOL) 500 MG tablet Take 2 tablets (1,000 mg total) by mouth every 6 (six) hours as needed for mild pain or fever.   amLODipine-valsartan (EXFORGE) 5-160 MG tablet Take 1 tablet by mouth daily.   B-12 METHYLCOBALAMIN PO Take by mouth.   cholecalciferol (VITAMIN D3) 25 MCG (1000 UNIT) tablet Take 1,000 Units by mouth daily.   co-enzyme Q-10 30 MG capsule Take 30 mg by mouth daily.   DULoxetine (CYMBALTA) 60 MG capsule Take 1 capsule (60 mg total) by mouth daily.   KRILL OIL PO Take by mouth. 1 capsule bid   Omega-3 Fatty Acids (FISH OIL) 435 MG CAPS Take by mouth.   polyethylene glycol (MIRALAX / GLYCOLAX) 17 g packet Take 17 g by mouth daily as needed for mild constipation.   progesterone (PROMETRIUM) 100 MG capsule Take 100 mg by mouth daily.   Trolamine Salicylate (BLUE-EMU HEMP EX) Take 1 tablet by mouth daily. Hemp muscadine   Turmeric (QC TUMERIC COMPLEX PO) Take 1 tablet by mouth daily.   No facility-administered encounter medications on file as of 05/17/2023.    Recent Results (from the past 2160 hour(s))  Genetic Screening Order     Status: None   Collection Time: 03/15/23  2:17 PM  Result Value Ref Range  Genetic Screening Order Collected by Gray     Comment: Performed at Stephanie Gray, [address], [place], [place] [address]     Psychiatric Specialty Exam: Physical Exam  Review of Systems  Weight 143 lb (64.9 kg), last menstrual period 06/19/2020.There is no height or weight on file to calculate BMI.  General Appearance: NA  Eye Contact:  NA  Speech:  Slow  Volume:  Decreased  Mood:  Dysphoric  Affect:  NA  Thought Process:  Goal Directed  Orientation:  Full (Time, Place, and Person)  Thought Content:  Rumination  Suicidal Thoughts:  No   Homicidal Thoughts:  No  Memory:  Immediate;   Good Recent;   Good Remote;   Good  Judgement:  Good  Insight:  Good  Psychomotor Activity:  Normal  Concentration:  Concentration: Good and Attention Span: Good  Recall:  Good  Fund of Knowledge:  Good  Language:  Good  Akathisia:  No  Handed:  Right  AIMS (if indicated):     Assets:  Communication Skills Desire for Improvement Housing Resilience Social Support Talents/Skills Transportation  ADL's:  Intact  Cognition:  WNL  Sleep:  ok     Assessment/Plan: MDD (major depressive disorder), recurrent episode, mild (HCC) - Plan: DULoxetine (CYMBALTA) 60 MG capsule  Anxiety - Plan: DULoxetine (CYMBALTA) 60 MG capsule  Patient is doing very well on current medication.  She is taking Cymbalta that has been helpful.  She also doing more outdoor activities and spending time at farm.  Continue Cymbalta 60 mg daily.  Recommend to call us back if she has any question or any concern.  Follow-up in 6 months   Follow Up Instructions:     I discussed the assessment and treatment plan with the patient. The patient was provided an opportunity to ask questions and all were answered. The patient agreed with the plan and demonstrated an understanding of the instructions.   The patient was advised to call back or seek an in-person evaluation if the symptoms worsen or if the condition fails to improve as anticipated.    Collaboration of Care: Other provider involved in patient's care AEB notes are available in epic to review.  Patient/Guardian was advised Release of Information must be obtained prior to any record release in order to collaborate their care with an outside provider. Patient/Guardian was advised if they have not already done so to contact the registration department to sign all necessary forms in order for [place] to release information regarding their care.   Consent: Patient/Guardian gives verbal consent for treatment and  assignment of benefits for services provided during this visit. Patient/Guardian expressed understanding and agreed to proceed.     I provided 20 minutes of non face to face time during this encounter.  Gray: This document was prepared by [employer] voice dictation technology and any errors that results from this process are unintentional.    Cleotis Nipper, MD 05/17/2023

## 2023-06-06 DIAGNOSIS — Z124 Encounter for screening for malignant neoplasm of cervix: Secondary | ICD-10-CM | POA: Diagnosis not present

## 2023-06-06 DIAGNOSIS — Z01419 Encounter for gynecological examination (general) (routine) without abnormal findings: Secondary | ICD-10-CM | POA: Diagnosis not present

## 2023-06-06 DIAGNOSIS — N939 Abnormal uterine and vaginal bleeding, unspecified: Secondary | ICD-10-CM | POA: Diagnosis not present

## 2023-06-20 ENCOUNTER — Other Ambulatory Visit: Payer: Self-pay | Admitting: Obstetrics and Gynecology

## 2023-06-20 DIAGNOSIS — N6310 Unspecified lump in the right breast, unspecified quadrant: Secondary | ICD-10-CM

## 2023-06-30 ENCOUNTER — Other Ambulatory Visit: Payer: Self-pay

## 2023-06-30 ENCOUNTER — Encounter (HOSPITAL_BASED_OUTPATIENT_CLINIC_OR_DEPARTMENT_OTHER): Payer: Self-pay | Admitting: Obstetrics and Gynecology

## 2023-06-30 NOTE — Progress Notes (Addendum)
Spoke w/ via phone for pre-op interview---pt Lab needs dos----cbc, cmp               Lab results------rkg 20-31-2023 epic COVID test -----patient states asymptomatic no test needed Arrive at -------1245 07-25-2023 NPO after MN NO Solid Food.  Clear liquids from MN until---1145 Med rec completed Medications to take morning of surgery -----duloxetine Diabetic medication -----n/a Patient instructed no nail polish to be worn day of surgery Patient instructed to bring photo id and insurance card day of surgery Patient aware to have Driver (ride ) / caregiver   husband nick  for 24 hours after surgery  Patient Special Instructions -----none Pre-Op special Instructions -----none Patient verbalized understanding of instructions that were given at this phone interview. Patient denies shortness of breath, chest pain, fever, cough at this phone interview.

## 2023-07-18 DIAGNOSIS — Z789 Other specified health status: Secondary | ICD-10-CM | POA: Diagnosis not present

## 2023-07-24 NOTE — H&P (Signed)
Stephanie Gray is an 58 y.o. female G2P2 for RA BS, family history of ovarian cancer.   D/W pt r/b/a of surgery, also process and expectations.  Will proceed.    Pertinent Gynecological History: G2P2,  3/22 WNL HR HPV neg, with endometrial cells (neg EMB) MMG BiRads 1 No STDs  Menstrual History:  Patient's last menstrual period was 06/19/2020 (approximate).    Past Medical History:  Diagnosis Date   Anemia    after c section   Anxiety    Apocrine metaplasia of breast, right 03/03/2018   Arthritis    Atrial tachycardia, paroxysmal    noted on event monitor 02/2020   Breast mass, right 04/12/2017   Cervical disc disease    Depression    GERD (gastroesophageal reflux disease)    Heart murmur    no current cardiologist   History of COVID-19 2020   History of HELLP syndrome, currently pregnant, third trimester 1997   Hypertension    hasnt taken medication since 01/09/2022 because bp was low   Hypothyroidism 03/17/2018   pr denies   Neuromuscular disorder (HCC)    numbness in 2 fingers of left hand   Perimenopausal disorder 03/17/2018   Pneumothorax 2023   Premature atrial contractions    PVC's (premature ventricular contractions)    noted on heart monitor 02/2020   Rib fracture 01/08/2022   right second, third and fourth ribs   Tubular adenoma of colon 04/27/2015   04/07/2015 @ 70 cm ,polyp; Dr Loreta Ave Due 2019     Past Surgical History:  Procedure Laterality Date   APPENDECTOMY     YRS AGO AGE 80   BLADDER REPAIR     BREAST LUMPECTOMY WITH RADIOACTIVE SEED LOCALIZATION Right 02/10/2017   Procedure: RIGHT BREAST LUMPECTOMY WITH RADIOACTIVE SEED LOCALIZATION;  Surgeon: Chevis Pretty III, MD;  Location: [facility] OR;  Service: General;  Laterality: Right;   CESAREAN SECTION     X 2, Dr Malachy Mood   CHOLECYSTECTOMY N/A 07/11/2020   Procedure: LAPAROSCOPIC CHOLECYSTECTOMY;  Surgeon: Manus Rudd, MD;  Location: [facility] OR;  Service: General;  Laterality: N/A;   EYE SURGERY Bilateral     Lasik    ORIF CLAVICULAR FRACTURE Right 01/15/2022   Procedure: OPEN REDUCTION INTERNAL FIXATION (ORIF) CLAVICULAR FRACTURE;  Surgeon: Beverely Low, MD;  Location: [facility];  Service: Orthopedics;  Laterality: Right;   TUBAL LIGATION     bladder laceration repair    Family History  Problem Relation Age of Onset   Hypertension Mother    Other Mother        congenital heart defect - bicuspid aortic valve   Cancer Mother 78       fallopian tube cancer, negative genetic testing   Diabetes Father    Heart disease Father    Hypertension Father    Other Sister        neurologic issue   Mitral valve prolapse Maternal Grandmother    Stroke Maternal Grandfather 72       dementia   Heart disease Paternal Grandmother    Breast cancer Paternal Grandmother 82 - 72   Cancer Paternal Grandfather 38 - 79       testicular   Breast cancer Maternal Great-grandmother 67    Social History:  reports that she has never smoked. She has never used smokeless tobacco. She reports that she does not currently use alcohol. She reports that she does not use drugs. 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  Allergies:  Allergies  Allergen Reactions   Benzodiazepines Other (See Comments)    Patient needed crash cart after tubal ligation   Other Other (See Comments)    Patient is extremely sensitive to all medicines, maybe needs 1/4 or 1/8 dosage of normal adult   Augmentin [Amoxicillin-Pot Clavulanate] Other (See Comments)    Shaky chills   Ozempic (0.25 Or 0.5 Mg-Dose) [Semaglutide(0.25 Or 0.5mg -Dos)] Nausea And Vomiting   Lamictal [Lamotrigine] Rash   Penicillins Rash    Rash because of a history of documented adverse serious drug reaction;Medi Alert bracelet  is recommended (CHILDHOOD REACTION) Has patient had a PCN reaction causing immediate rash, facial/tongue/throat swelling, SOB or lightheadedness with hypotension:UNKNOWN Has patient had a PCN reaction causing severe rash involving mucus  membranes or skin necrosis:unsure Has patient had a PCN reaction that required hospitalization:unsure Has patient had a PCN reaction occurring within the last 10 years:unsure     Meds: amlodipine-valsartan, duloxetine, odansetron, synthroid.  Review of Systems  Constitutional: Negative.   Respiratory: Negative.    Cardiovascular: Negative.   Gastrointestinal: Negative.   Genitourinary: Negative.   Musculoskeletal: Negative.   Skin: Negative.   Neurological: Negative.   Psychiatric/Behavioral: Negative.      Height 5' 1.25" (1.556 m), weight 70.3 kg, last menstrual period 06/19/2020. Physical Exam Constitutional:      Appearance: Normal appearance.  HENT:     Head: Normocephalic and atraumatic.  Cardiovascular:     Rate and Rhythm: Normal rate and regular rhythm.  Pulmonary:     Effort: Pulmonary effort is normal.     Breath sounds: Normal breath sounds.  Abdominal:     General: Bowel sounds are normal.     Palpations: Abdomen is soft.  Genitourinary:    General: Normal vulva.     Rectum: Normal.  Musculoskeletal:        General: Normal range of motion.     Cervical back: Normal range of motion and neck supple.  Skin:    General: Skin is warm and dry.  Neurological:     General: No focal deficit present.     Mental Status: She is alert and oriented to person, place, and time.  Psychiatric:        Mood and Affect: Mood normal.        Behavior: Behavior normal.     Assessment/Plan: 56yo G2P2 for RA BS given FH of ovarian cancer D/w pt r/b/a, process and expectation, will proceed Postop meds given   Bovard-Stuckert 07/24/2023, 11:38 AM

## 2023-07-25 ENCOUNTER — Ambulatory Visit (HOSPITAL_BASED_OUTPATIENT_CLINIC_OR_DEPARTMENT_OTHER): Payer: BC Managed Care – PPO | Admitting: Certified Registered Nurse Anesthetist

## 2023-07-25 ENCOUNTER — Ambulatory Visit (HOSPITAL_COMMUNITY): Payer: BC Managed Care – PPO

## 2023-07-25 ENCOUNTER — Encounter (HOSPITAL_BASED_OUTPATIENT_CLINIC_OR_DEPARTMENT_OTHER): Payer: Self-pay | Admitting: Obstetrics and Gynecology

## 2023-07-25 ENCOUNTER — Inpatient Hospital Stay (HOSPITAL_BASED_OUTPATIENT_CLINIC_OR_DEPARTMENT_OTHER)
Admission: RE | Admit: 2023-07-25 | Discharge: 2023-07-26 | DRG: 335 | Disposition: A | Discharge status: Home / Self Care | Payer: BC Managed Care – PPO | Attending: Obstetrics and Gynecology | Admitting: Obstetrics and Gynecology

## 2023-07-25 ENCOUNTER — Encounter (HOSPITAL_COMMUNITY)
Admission: RE | Disposition: A | Discharge status: Home / Self Care | Payer: Self-pay | Source: Home / Self Care | Attending: Obstetrics and Gynecology

## 2023-07-25 ENCOUNTER — Other Ambulatory Visit: Payer: Self-pay

## 2023-07-25 DIAGNOSIS — Z302 Encounter for sterilization: Secondary | ICD-10-CM | POA: Diagnosis not present

## 2023-07-25 DIAGNOSIS — K660 Peritoneal adhesions (postprocedural) (postinfection): Principal | ICD-10-CM | POA: Diagnosis present

## 2023-07-25 DIAGNOSIS — Z888 Allergy status to other drugs, medicaments and biological substances status: Secondary | ICD-10-CM | POA: Diagnosis not present

## 2023-07-25 DIAGNOSIS — Z79899 Other long term (current) drug therapy: Secondary | ICD-10-CM

## 2023-07-25 DIAGNOSIS — K219 Gastro-esophageal reflux disease without esophagitis: Secondary | ICD-10-CM | POA: Diagnosis present

## 2023-07-25 DIAGNOSIS — Z9079 Acquired absence of other genital organ(s): Secondary | ICD-10-CM

## 2023-07-25 DIAGNOSIS — F419 Anxiety disorder, unspecified: Secondary | ICD-10-CM | POA: Diagnosis present

## 2023-07-25 DIAGNOSIS — Z9049 Acquired absence of other specified parts of digestive tract: Secondary | ICD-10-CM

## 2023-07-25 DIAGNOSIS — Z4003 Encounter for prophylactic removal of fallopian tube(s): Secondary | ICD-10-CM | POA: Diagnosis not present

## 2023-07-25 DIAGNOSIS — Z881 Allergy status to other antibiotic agents status: Secondary | ICD-10-CM | POA: Diagnosis not present

## 2023-07-25 DIAGNOSIS — I10 Essential (primary) hypertension: Secondary | ICD-10-CM | POA: Diagnosis present

## 2023-07-25 DIAGNOSIS — Z803 Family history of malignant neoplasm of breast: Secondary | ICD-10-CM | POA: Diagnosis not present

## 2023-07-25 DIAGNOSIS — Z86010 Personal history of colonic polyps: Secondary | ICD-10-CM | POA: Diagnosis not present

## 2023-07-25 DIAGNOSIS — Z880 Allergy status to penicillin: Secondary | ICD-10-CM | POA: Diagnosis not present

## 2023-07-25 DIAGNOSIS — K661 Hemoperitoneum: Secondary | ICD-10-CM | POA: Diagnosis not present

## 2023-07-25 DIAGNOSIS — F32A Depression, unspecified: Secondary | ICD-10-CM | POA: Diagnosis not present

## 2023-07-25 DIAGNOSIS — Z8249 Family history of ischemic heart disease and other diseases of the circulatory system: Secondary | ICD-10-CM

## 2023-07-25 DIAGNOSIS — L7622 Postprocedural hemorrhage and hematoma of skin and subcutaneous tissue following other procedure: Secondary | ICD-10-CM | POA: Diagnosis not present

## 2023-07-25 DIAGNOSIS — M1990 Unspecified osteoarthritis, unspecified site: Secondary | ICD-10-CM | POA: Diagnosis present

## 2023-07-25 DIAGNOSIS — Z9889 Other specified postprocedural states: Secondary | ICD-10-CM

## 2023-07-25 DIAGNOSIS — Z902 Acquired absence of lung [part of]: Secondary | ICD-10-CM

## 2023-07-25 DIAGNOSIS — Z8041 Family history of malignant neoplasm of ovary: Secondary | ICD-10-CM | POA: Diagnosis not present

## 2023-07-25 DIAGNOSIS — Z800 Family history of malignant neoplasm of digestive organs: Secondary | ICD-10-CM | POA: Diagnosis not present

## 2023-07-25 DIAGNOSIS — Z8616 Personal history of COVID-19: Secondary | ICD-10-CM

## 2023-07-25 DIAGNOSIS — E039 Hypothyroidism, unspecified: Secondary | ICD-10-CM | POA: Diagnosis not present

## 2023-07-25 DIAGNOSIS — Z79890 Hormone replacement therapy (postmenopausal): Secondary | ICD-10-CM | POA: Diagnosis not present

## 2023-07-25 DIAGNOSIS — Z01818 Encounter for other preprocedural examination: Secondary | ICD-10-CM

## 2023-07-25 DIAGNOSIS — K565 Intestinal adhesions [bands], unspecified as to partial versus complete obstruction: Secondary | ICD-10-CM | POA: Diagnosis not present

## 2023-07-25 DIAGNOSIS — D62 Acute posthemorrhagic anemia: Secondary | ICD-10-CM

## 2023-07-25 HISTORY — DX: Gastro-esophageal reflux disease without esophagitis: K21.9

## 2023-07-25 HISTORY — PX: ROBOTIC ASSISTED LAPAROSCOPIC LYSIS OF ADHESION: SHX6080

## 2023-07-25 HISTORY — DX: Cardiac murmur, unspecified: R01.1

## 2023-07-25 HISTORY — PX: LAPAROSCOPY: SHX197

## 2023-07-25 HISTORY — PX: XI ROBOTIC ASSISTED SALPINGECTOMY: SHX6824

## 2023-07-25 HISTORY — PX: INTRAUTERINE DEVICE (IUD) INSERTION: SHX5877

## 2023-07-25 LAB — COMPREHENSIVE METABOLIC PANEL
ALT: 19 U/L (ref 0–44)
AST: 20 U/L (ref 15–41)
Albumin: 4.4 g/dL (ref 3.5–5.0)
Alkaline Phosphatase: 71 U/L (ref 38–126)
Anion gap: 9 (ref 5–15)
BUN: 12 mg/dL (ref 6–20)
CO2: 23 mmol/L (ref 22–32)
Calcium: 8.9 mg/dL (ref 8.9–10.3)
Chloride: 106 mmol/L (ref 98–111)
Creatinine, Ser: 0.77 mg/dL (ref 0.44–1.00)
GFR, Estimated: >60 mL/min (ref >60)
Glucose, Bld: 94 mg/dL (ref 70–99)
Potassium: 3.7 mmol/L (ref 3.5–5.1)
Sodium: 138 mmol/L (ref 135–145)
Total Bilirubin: 0.6 mg/dL (ref 0.3–1.2)
Total Protein: 7.4 g/dL (ref 6.5–8.1)

## 2023-07-25 LAB — BPAM RBC
Blood Product Expiration Date: 202408312359
Blood Product Expiration Date: 202408312359
Blood Product Expiration Date: 202409062359
Blood Product Expiration Date: 202409062359
Blood Product Expiration Date: 202409062359
Blood Product Expiration Date: 202409062359
ISSUE DATE / TIME: 202408051826
ISSUE DATE / TIME: 202408051826
ISSUE DATE / TIME: 202408051920
ISSUE DATE / TIME: 202408051920
ISSUE DATE / TIME: 202408051920
ISSUE DATE / TIME: 202408051920
Unit Type and Rh: 5100
Unit Type and Rh: 5100
Unit Type and Rh: 5100
Unit Type and Rh: 5100
Unit Type and Rh: 9500
Unit Type and Rh: 9500

## 2023-07-25 LAB — TYPE AND SCREEN
ABO/RH(D): B POS
Antibody Screen: NEGATIVE
Unit division: 0
Unit division: 0
Unit division: 0
Unit division: 0
Unit division: 0
Unit division: 0

## 2023-07-25 LAB — PREPARE FRESH FROZEN PLASMA
Unit division: 0
Unit division: 0

## 2023-07-25 LAB — CBC
HCT: 45.3 % (ref 36.0–46.0)
Hemoglobin: 14.5 g/dL (ref 12.0–15.0)
MCH: 28.1 pg (ref 26.0–34.0)
MCHC: 32 g/dL (ref 30.0–36.0)
MCV: 87.8 fL (ref 80.0–100.0)
Platelets: 278 10*3/uL (ref 150–400)
RBC: 5.16 MIL/uL — ABNORMAL HIGH (ref 3.87–5.11)
RDW: 12.8 % (ref 11.5–15.5)
WBC: 6.3 10*3/uL (ref 4.0–10.5)
nRBC: 0 % (ref 0.0–0.2)

## 2023-07-25 LAB — BPAM FFP
Blood Product Expiration Date: 202408072359
Blood Product Expiration Date: 202408092359
ISSUE DATE / TIME: 202408051946
ISSUE DATE / TIME: 202408051946
Unit Type and Rh: 6200
Unit Type and Rh: 6200

## 2023-07-25 LAB — BASIC METABOLIC PANEL
Anion gap: 6 (ref 5–15)
BUN: 13 mg/dL (ref 6–20)
CO2: 21 mmol/L — ABNORMAL LOW (ref 22–32)
Calcium: 6.9 mg/dL — ABNORMAL LOW (ref 8.9–10.3)
Chloride: 108 mmol/L (ref 98–111)
Creatinine, Ser: 0.76 mg/dL (ref 0.44–1.00)
GFR, Estimated: >60 mL/min (ref >60)
Glucose, Bld: 152 mg/dL — ABNORMAL HIGH (ref 70–99)
Potassium: 3.2 mmol/L — ABNORMAL LOW (ref 3.5–5.1)
Sodium: 135 mmol/L (ref 135–145)

## 2023-07-25 LAB — HEMOGLOBIN AND HEMATOCRIT, BLOOD
HCT: 19.4 % — ABNORMAL LOW (ref 36.0–46.0)
Hemoglobin: 6.1 g/dL — CL (ref 12.0–15.0)

## 2023-07-25 SURGERY — SALPINGECTOMY, ROBOT-ASSISTED
Anesthesia: General | Site: Abdomen | Laterality: Bilateral

## 2023-07-25 SURGERY — INSERTION, INTRAUTERINE DEVICE
Anesthesia: General

## 2023-07-25 MED ORDER — IBUPROFEN 800 MG PO TABS
800.0000 mg | ORAL_TABLET | Freq: Three times a day (TID) | ORAL | Status: DC | PRN
  Administered 2023-07-26 (×2): 800 mg via ORAL
  Filled 2023-07-25 (×2): qty 1

## 2023-07-25 MED ORDER — EPHEDRINE 5 MG/ML INJ
INTRAVENOUS | Status: AC
  Filled 2023-07-25: qty 5

## 2023-07-25 MED ORDER — OXYCODONE HCL 5 MG/5ML PO SOLN: 5.0000 mg | Freq: Once | ORAL | Status: DC | PRN

## 2023-07-25 MED ORDER — KETOROLAC TROMETHAMINE 30 MG/ML IJ SOLN: 30.0000 mg | Freq: Once | INTRAMUSCULAR | Status: DC | PRN

## 2023-07-25 MED ORDER — ACETAMINOPHEN 500 MG PO TABS
ORAL_TABLET | ORAL | Status: AC
  Filled 2023-07-25: qty 2

## 2023-07-25 MED ORDER — ALBUMIN HUMAN 5 % IV SOLN
12.5000 g | Freq: Once | INTRAVENOUS | Status: AC
  Administered 2023-07-25: 12.5 g via INTRAVENOUS

## 2023-07-25 MED ORDER — OXYCODONE HCL 5 MG PO TABS: 5.0000 mg | ORAL_TABLET | Freq: Once | ORAL | Status: DC | PRN

## 2023-07-25 MED ORDER — FAMOTIDINE IN NACL 20-0.9 MG/50ML-% IV SOLN
INTRAVENOUS | Status: DC | PRN
  Administered 2023-07-25: 20 mg via INTRAVENOUS

## 2023-07-25 MED ORDER — PROPOFOL 10 MG/ML IV BOLUS
INTRAVENOUS | Status: AC
  Filled 2023-07-25: qty 20

## 2023-07-25 MED ORDER — SIMETHICONE 80 MG PO CHEW
80.0000 mg | CHEWABLE_TABLET | Freq: Four times a day (QID) | ORAL | Status: DC | PRN
  Administered 2023-07-26: 80 mg via ORAL
  Filled 2023-07-25: qty 1

## 2023-07-25 MED ORDER — SUCCINYLCHOLINE CHLORIDE 200 MG/10ML IV SOSY
PREFILLED_SYRINGE | INTRAVENOUS | Status: DC | PRN
  Administered 2023-07-25: 80 mg via INTRAVENOUS

## 2023-07-25 MED ORDER — ONDANSETRON HCL 4 MG/2ML IJ SOLN: 4.0000 mg | Freq: Four times a day (QID) | INTRAMUSCULAR | Status: DC | PRN

## 2023-07-25 MED ORDER — KETAMINE HCL 50 MG/5ML IJ SOSY
PREFILLED_SYRINGE | INTRAMUSCULAR | Status: AC
  Filled 2023-07-25: qty 5

## 2023-07-25 MED ORDER — ALUM & MAG HYDROXIDE-SIMETH 200-200-20 MG/5ML PO SUSP: 30.0000 mL | ORAL | Status: DC | PRN

## 2023-07-25 MED ORDER — SODIUM CHLORIDE (PF) 0.9 % IJ SOLN
INTRAMUSCULAR | Status: AC
  Filled 2023-07-25: qty 10

## 2023-07-25 MED ORDER — AMISULPRIDE (ANTIEMETIC) 5 MG/2ML IV SOLN
INTRAVENOUS | Status: AC
  Filled 2023-07-25: qty 2

## 2023-07-25 MED ORDER — KETAMINE HCL 10 MG/ML IJ SOLN
INTRAMUSCULAR | Status: DC | PRN
  Administered 2023-07-25 (×2): 10 mg via INTRAVENOUS
  Administered 2023-07-25: 30 mg via INTRAVENOUS

## 2023-07-25 MED ORDER — ACETAMINOPHEN 500 MG PO TABS
1000.0000 mg | ORAL_TABLET | ORAL | Status: AC
  Administered 2023-07-25: 1000 mg via ORAL

## 2023-07-25 MED ORDER — FENTANYL CITRATE (PF) 100 MCG/2ML IJ SOLN: 25.0000 ug | INTRAMUSCULAR | Status: DC | PRN

## 2023-07-25 MED ORDER — FENTANYL CITRATE (PF) 100 MCG/2ML IJ SOLN
INTRAMUSCULAR | Status: DC | PRN
  Administered 2023-07-25: 100 ug via INTRAVENOUS

## 2023-07-25 MED ORDER — FAMOTIDINE IN NACL 20-0.9 MG/50ML-% IV SOLN
INTRAVENOUS | Status: AC
  Filled 2023-07-25: qty 50

## 2023-07-25 MED ORDER — AMISULPRIDE (ANTIEMETIC) 5 MG/2ML IV SOLN
10.0000 mg | Freq: Once | INTRAVENOUS | Status: AC | PRN
  Administered 2023-07-25: 10 mg via INTRAVENOUS

## 2023-07-25 MED ORDER — ROCURONIUM BROMIDE 10 MG/ML (PF) SYRINGE
PREFILLED_SYRINGE | INTRAVENOUS | Status: DC | PRN
  Administered 2023-07-25 (×2): 20 mg via INTRAVENOUS
  Administered 2023-07-25: 30 mg via INTRAVENOUS

## 2023-07-25 MED ORDER — PHENYLEPHRINE HCL-NACL 20-0.9 MG/250ML-% IV SOLN
INTRAVENOUS | Status: DC | PRN
Start: 2023-07-25 — End: 2023-07-25
  Administered 2023-07-25: 80 ug/min via INTRAVENOUS

## 2023-07-25 MED ORDER — AMLODIPINE BESYLATE 5 MG PO TABS
5.0000 mg | ORAL_TABLET | Freq: Every day | ORAL | Status: DC
  Administered 2023-07-26: 5 mg via ORAL
  Filled 2023-07-25: qty 1

## 2023-07-25 MED ORDER — PROMETHAZINE HCL 25 MG/ML IJ SOLN: 6.2500 mg | INTRAMUSCULAR | Status: DC | PRN

## 2023-07-25 MED ORDER — PHENYLEPHRINE 80 MCG/ML (10ML) SYRINGE FOR IV PUSH (FOR BLOOD PRESSURE SUPPORT)
PREFILLED_SYRINGE | INTRAVENOUS | Status: DC | PRN
  Administered 2023-07-25 (×2): 160 ug via INTRAVENOUS
  Administered 2023-07-25: 80 ug via INTRAVENOUS
  Administered 2023-07-25: 40 ug via INTRAVENOUS

## 2023-07-25 MED ORDER — CEFAZOLIN SODIUM-DEXTROSE 2-3 GM-%(50ML) IV SOLR
INTRAVENOUS | Status: DC | PRN
  Administered 2023-07-25: 2 g via INTRAVENOUS

## 2023-07-25 MED ORDER — IRBESARTAN 150 MG PO TABS
150.0000 mg | ORAL_TABLET | Freq: Every day | ORAL | Status: DC
  Administered 2023-07-26: 150 mg via ORAL
  Filled 2023-07-25: qty 1

## 2023-07-25 MED ORDER — PROPOFOL 500 MG/50ML IV EMUL
INTRAVENOUS | Status: AC
  Filled 2023-07-25: qty 50

## 2023-07-25 MED ORDER — PROPOFOL 10 MG/ML IV BOLUS
INTRAVENOUS | Status: DC | PRN
  Administered 2023-07-25: 100 mg via INTRAVENOUS

## 2023-07-25 MED ORDER — SODIUM CHLORIDE 0.9 % IV SOLN: INTRAVENOUS | Status: DC | PRN

## 2023-07-25 MED ORDER — AMISULPRIDE (ANTIEMETIC) 5 MG/2ML IV SOLN: 10.0000 mg | Freq: Once | INTRAVENOUS | Status: DC | PRN

## 2023-07-25 MED ORDER — OXYCODONE-ACETAMINOPHEN 5-325 MG PO TABS: 1.0000 | ORAL_TABLET | ORAL | Status: DC | PRN

## 2023-07-25 MED ORDER — PROGESTERONE MICRONIZED 100 MG PO CAPS
100.0000 mg | ORAL_CAPSULE | Freq: Every day | ORAL | Status: DC
  Administered 2023-07-26: 100 mg via ORAL
  Filled 2023-07-25: qty 1

## 2023-07-25 MED ORDER — FENTANYL CITRATE (PF) 100 MCG/2ML IJ SOLN
INTRAMUSCULAR | Status: AC
  Filled 2023-07-25: qty 2

## 2023-07-25 MED ORDER — CEFAZOLIN SODIUM-DEXTROSE 2-4 GM/100ML-% IV SOLN
INTRAVENOUS | Status: AC
  Filled 2023-07-25: qty 100

## 2023-07-25 MED ORDER — ONDANSETRON HCL 4 MG/2ML IJ SOLN
INTRAMUSCULAR | Status: AC
  Filled 2023-07-25: qty 2

## 2023-07-25 MED ORDER — GUAIFENESIN 100 MG/5ML PO LIQD: 15.0000 mL | ORAL | Status: DC | PRN

## 2023-07-25 MED ORDER — AMLODIPINE BESYLATE-VALSARTAN 5-160 MG PO TABS: 1.0000 | ORAL_TABLET | Freq: Every day | ORAL | Status: DC

## 2023-07-25 MED ORDER — ROCURONIUM BROMIDE 10 MG/ML (PF) SYRINGE
PREFILLED_SYRINGE | INTRAVENOUS | Status: DC | PRN
  Administered 2023-07-25: 50 mg via INTRAVENOUS
  Administered 2023-07-25: 10 mg via INTRAVENOUS

## 2023-07-25 MED ORDER — ONDANSETRON HCL 4 MG/2ML IJ SOLN: 4.0000 mg | Freq: Once | INTRAMUSCULAR | Status: DC | PRN

## 2023-07-25 MED ORDER — DIPHENHYDRAMINE HCL 50 MG/ML IJ SOLN
INTRAMUSCULAR | Status: AC
  Filled 2023-07-25: qty 1

## 2023-07-25 MED ORDER — DEXMEDETOMIDINE HCL IN NACL 80 MCG/20ML IV SOLN
INTRAVENOUS | Status: DC | PRN
  Administered 2023-07-25: 8 ug via INTRAVENOUS

## 2023-07-25 MED ORDER — ALBUMIN HUMAN 5 % IV SOLN
INTRAVENOUS | Status: AC
  Filled 2023-07-25: qty 250

## 2023-07-25 MED ORDER — HYDROMORPHONE HCL 1 MG/ML IJ SOLN
0.2000 mg | INTRAMUSCULAR | Status: DC | PRN
  Administered 2023-07-26: 0.6 mg via INTRAVENOUS
  Filled 2023-07-25: qty 1

## 2023-07-25 MED ORDER — LACTATED RINGERS IV SOLN: INTRAVENOUS | Status: DC

## 2023-07-25 MED ORDER — SUGAMMADEX SODIUM 200 MG/2ML IV SOLN
INTRAVENOUS | Status: DC | PRN
  Administered 2023-07-25: 200 mg via INTRAVENOUS

## 2023-07-25 MED ORDER — ACETAMINOPHEN 10 MG/ML IV SOLN
INTRAVENOUS | Status: AC
  Administered 2023-07-25: 1000 mg via INTRAVENOUS
  Filled 2023-07-25: qty 100

## 2023-07-25 MED ORDER — BUPIVACAINE HCL (PF) 0.25 % IJ SOLN
INTRAMUSCULAR | Status: DC | PRN
  Administered 2023-07-25: 10 mL

## 2023-07-25 MED ORDER — SODIUM CHLORIDE 0.9 % IR SOLN
Status: DC | PRN
  Administered 2023-07-25: 1000 mL

## 2023-07-25 MED ORDER — MENTHOL 3 MG MT LOZG: 1.0000 | LOZENGE | OROMUCOSAL | Status: DC | PRN

## 2023-07-25 MED ORDER — SCOPOLAMINE 1 MG/3DAYS TD PT72
MEDICATED_PATCH | TRANSDERMAL | Status: AC
  Filled 2023-07-25: qty 1

## 2023-07-25 MED ORDER — DEXAMETHASONE SODIUM PHOSPHATE 10 MG/ML IJ SOLN
INTRAMUSCULAR | Status: DC | PRN
  Administered 2023-07-25: 5 mg via INTRAVENOUS

## 2023-07-25 MED ORDER — STERILE WATER FOR IRRIGATION IR SOLN
Status: DC | PRN
  Administered 2023-07-25: 500 mL

## 2023-07-25 MED ORDER — ONDANSETRON HCL 4 MG PO TABS: 4.0000 mg | ORAL_TABLET | Freq: Four times a day (QID) | ORAL | Status: DC | PRN

## 2023-07-25 MED ORDER — ONDANSETRON HCL 4 MG/2ML IJ SOLN
INTRAMUSCULAR | Status: DC | PRN
Start: 2023-07-25 — End: 2023-07-25
  Administered 2023-07-25: 4 mg via INTRAVENOUS

## 2023-07-25 MED ORDER — GABAPENTIN 300 MG PO CAPS
ORAL_CAPSULE | ORAL | Status: AC
  Filled 2023-07-25: qty 1

## 2023-07-25 MED ORDER — PHENYLEPHRINE HCL-NACL 20-0.9 MG/250ML-% IV SOLN
INTRAVENOUS | Status: DC | PRN
  Administered 2023-07-25: 20 ug/min via INTRAVENOUS

## 2023-07-25 MED ORDER — KETOROLAC TROMETHAMINE 30 MG/ML IJ SOLN
INTRAMUSCULAR | Status: DC | PRN
  Administered 2023-07-25: 30 mg via INTRAVENOUS

## 2023-07-25 MED ORDER — ONDANSETRON HCL 4 MG/2ML IJ SOLN
INTRAMUSCULAR | Status: DC | PRN
  Administered 2023-07-25: 4 mg via INTRAVENOUS

## 2023-07-25 MED ORDER — POVIDONE-IODINE 10 % EX SWAB: 2.0000 | Freq: Once | CUTANEOUS | Status: DC

## 2023-07-25 MED ORDER — GLYCOPYRROLATE PF 0.2 MG/ML IJ SOSY
PREFILLED_SYRINGE | INTRAMUSCULAR | Status: AC
  Filled 2023-07-25: qty 1

## 2023-07-25 MED ORDER — PHENYLEPHRINE 80 MCG/ML (10ML) SYRINGE FOR IV PUSH (FOR BLOOD PRESSURE SUPPORT)
PREFILLED_SYRINGE | INTRAVENOUS | Status: AC
  Filled 2023-07-25: qty 10

## 2023-07-25 MED ORDER — ACETAMINOPHEN 10 MG/ML IV SOLN: 1000.0000 mg | Freq: Once | INTRAVENOUS | Status: DC | PRN

## 2023-07-25 MED ORDER — PHENYLEPHRINE HCL (PRESSORS) 10 MG/ML IV SOLN
INTRAVENOUS | Status: AC
  Filled 2023-07-25: qty 1

## 2023-07-25 MED ORDER — LIDOCAINE HCL (PF) 2 % IJ SOLN
INTRAMUSCULAR | Status: AC
  Filled 2023-07-25: qty 5

## 2023-07-25 MED ORDER — GLYCOPYRROLATE 0.2 MG/ML IJ SOLN
INTRAMUSCULAR | Status: DC | PRN
  Administered 2023-07-25: .2 mg via INTRAVENOUS

## 2023-07-25 MED ORDER — LIDOCAINE 2% (20 MG/ML) 5 ML SYRINGE
INTRAMUSCULAR | Status: DC | PRN
  Administered 2023-07-25: 60 mg via INTRAVENOUS

## 2023-07-25 MED ORDER — DEXAMETHASONE SODIUM PHOSPHATE 10 MG/ML IJ SOLN
INTRAMUSCULAR | Status: AC
  Filled 2023-07-25: qty 1

## 2023-07-25 MED ORDER — ROCURONIUM BROMIDE 10 MG/ML (PF) SYRINGE
PREFILLED_SYRINGE | INTRAVENOUS | Status: AC
  Filled 2023-07-25: qty 10

## 2023-07-25 MED ORDER — MIDAZOLAM HCL 2 MG/2ML IJ SOLN
INTRAMUSCULAR | Status: AC
  Filled 2023-07-25: qty 2

## 2023-07-25 MED ORDER — DULOXETINE HCL 30 MG PO CPEP
60.0000 mg | ORAL_CAPSULE | Freq: Every day | ORAL | Status: DC
  Administered 2023-07-26: 60 mg via ORAL
  Filled 2023-07-25: qty 2

## 2023-07-25 MED ORDER — HYDROMORPHONE HCL 1 MG/ML IJ SOLN: 0.2500 mg | INTRAMUSCULAR | Status: DC | PRN

## 2023-07-25 MED ORDER — KETOROLAC TROMETHAMINE 30 MG/ML IJ SOLN
INTRAMUSCULAR | Status: AC
  Filled 2023-07-25: qty 2

## 2023-07-25 MED ORDER — MIDAZOLAM HCL 5 MG/5ML IJ SOLN
INTRAMUSCULAR | Status: DC | PRN
  Administered 2023-07-25: 2 mg via INTRAVENOUS

## 2023-07-25 MED ORDER — DIPHENHYDRAMINE HCL 50 MG/ML IJ SOLN
INTRAMUSCULAR | Status: DC | PRN
  Administered 2023-07-25: 12.5 mg via INTRAVENOUS

## 2023-07-25 MED ORDER — SUCCINYLCHOLINE CHLORIDE 200 MG/10ML IV SOSY
PREFILLED_SYRINGE | INTRAVENOUS | Status: AC
  Filled 2023-07-25: qty 10

## 2023-07-25 MED ORDER — SCOPOLAMINE 1 MG/3DAYS TD PT72
1.0000 | MEDICATED_PATCH | TRANSDERMAL | Status: DC
  Administered 2023-07-25: 1.5 mg via TRANSDERMAL

## 2023-07-25 MED ORDER — ALBUMIN HUMAN 5 % IV SOLN: INTRAVENOUS | Status: DC | PRN

## 2023-07-25 MED ORDER — GABAPENTIN 300 MG PO CAPS
300.0000 mg | ORAL_CAPSULE | ORAL | Status: AC
  Administered 2023-07-25: 300 mg via ORAL

## 2023-07-25 MED ORDER — EPHEDRINE SULFATE (PRESSORS) 50 MG/ML IJ SOLN
INTRAMUSCULAR | Status: DC | PRN
  Administered 2023-07-25: 10 mg via INTRAVENOUS
  Administered 2023-07-25: 5 mg via INTRAVENOUS
  Administered 2023-07-25: 10 mg via INTRAVENOUS

## 2023-07-25 MED ORDER — FENTANYL CITRATE (PF) 100 MCG/2ML IJ SOLN
INTRAMUSCULAR | Status: DC | PRN
  Administered 2023-07-25 (×4): 25 ug via INTRAVENOUS

## 2023-07-25 SURGICAL SUPPLY — 40 items
ADH SKN CLS APL DERMABOND .7 (GAUZE/BANDAGES/DRESSINGS) ×1
APL PRP STRL LF DISP 70% ISPRP (MISCELLANEOUS)
BAG COUNTER SPONGE SURGICOUNT (BAG) IMPLANT
BAG SPNG CNTER NS LX DISP (BAG)
CABLE HIGH FREQUENCY MONO STRZ (ELECTRODE) IMPLANT
CELLS DAT CNTRL 66122 CELL SVR (MISCELLANEOUS) ×1 IMPLANT
CHLORAPREP W/TINT 26 (MISCELLANEOUS) ×1 IMPLANT
DERMABOND ADVANCED .7 DNX12 (GAUZE/BANDAGES/DRESSINGS) ×1 IMPLANT
DRSG OPSITE POSTOP 4X6 (GAUZE/BANDAGES/DRESSINGS) IMPLANT
GLOVE BIO SURGEON STRL SZ 6.5 (GLOVE) ×1 IMPLANT
GLOVE BIOGEL PI IND STRL 7.0 (GLOVE) ×1 IMPLANT
GOWN STRL REUS W/ TWL LRG LVL3 (GOWN DISPOSABLE) ×2 IMPLANT
GOWN STRL REUS W/TWL LRG LVL3 (GOWN DISPOSABLE) ×3
IRRIG SUCT STRYKERFLOW 2 WTIP (MISCELLANEOUS)
IRRIGATION SUCT STRKRFLW 2 WTP (MISCELLANEOUS) IMPLANT
NDL INSUFFLATION 14GA 120MM (NEEDLE) ×1 IMPLANT
NEEDLE INSUFFLATION 14GA 120MM (NEEDLE) IMPLANT
NS IRRIG 1000ML POUR BTL (IV SOLUTION) ×1 IMPLANT
PACK LAPAROSCOPY BASIN (CUSTOM PROCEDURE TRAY) ×1 IMPLANT
PAD POSITIONING PINK XL (MISCELLANEOUS) IMPLANT
POUCH LAPAROSCOPIC INSTRUMENT (MISCELLANEOUS) ×1 IMPLANT
PROTECTOR NERVE ULNAR (MISCELLANEOUS) ×2 IMPLANT
RETRACTOR WND ALEXIS 18 MED (MISCELLANEOUS) IMPLANT
RTRCTR WOUND ALEXIS 18CM MED (MISCELLANEOUS) ×1
SET TUBE SMOKE EVAC HIGH FLOW (TUBING) ×1 IMPLANT
SHEARS HARMONIC ACE PLUS 36CM (ENDOMECHANICALS) IMPLANT
SHEET LAVH (DRAPES) IMPLANT
SLEEVE Z-THREAD 5X100MM (TROCAR) ×1 IMPLANT
SPONGE T-LAP 18X18 ~~LOC~~+RFID (SPONGE) IMPLANT
STRIP CLOSURE SKIN 1/2X4 (GAUZE/BANDAGES/DRESSINGS) IMPLANT
SUT MNCRL AB 4-0 PS2 18 (SUTURE) IMPLANT
SUT PDS AB 1 TP1 96 (SUTURE) IMPLANT
SUT VIC AB 4-0 PS2 18 (SUTURE) ×1 IMPLANT
SUT VICRYL 0 UR6 27IN ABS (SUTURE) IMPLANT
SYS BAG RETRIEVAL 10MM (BASKET)
SYSTEM BAG RETRIEVAL 10MM (BASKET) IMPLANT
TOWEL OR 17X26 10 PK STRL BLUE (TOWEL DISPOSABLE) ×2 IMPLANT
TRAY FOLEY MTR SLVR 14FR STAT (SET/KITS/TRAYS/PACK) ×1 IMPLANT
TROCAR 11X100 Z THREAD (TROCAR) IMPLANT
TROCAR Z-THREAD FIOS 5X100MM (TROCAR) ×1 IMPLANT

## 2023-07-25 SURGICAL SUPPLY — 58 items
ADH SKN CLS APL DERMABOND .7 (GAUZE/BANDAGES/DRESSINGS) ×2
CANNULA CAP OBTURATR AIRSEAL 8 (CAP) ×2 IMPLANT
CATH FOLEY 3WAY 5CC 16FR (CATHETERS) ×2 IMPLANT
COVER BACK TABLE 60X90IN (DRAPES) ×2 IMPLANT
COVER TIP SHEARS 8 DVNC (MISCELLANEOUS) ×2 IMPLANT
DEFOGGER SCOPE WARMER CLEARIFY (MISCELLANEOUS) ×2 IMPLANT
DERMABOND ADVANCED .7 DNX12 (GAUZE/BANDAGES/DRESSINGS) ×2 IMPLANT
DRAPE ARM DVNC X/XI (DISPOSABLE) ×8 IMPLANT
DRAPE COLUMN DVNC XI (DISPOSABLE) ×2 IMPLANT
DRAPE SURG IRRIG POUCH 19X23 (DRAPES) ×2 IMPLANT
DRAPE UTILITY XL STRL (DRAPES) ×2 IMPLANT
DURAPREP 26ML APPLICATOR (WOUND CARE) ×2 IMPLANT
ELECT REM PT RETURN 9FT ADLT (ELECTROSURGICAL) ×2
ELECTRODE REM PT RTRN 9FT ADLT (ELECTROSURGICAL) ×2 IMPLANT
FORCEPS BPLR FENES DVNC XI (FORCEP) ×2 IMPLANT
GAUZE 4X4 16PLY ~~LOC~~+RFID DBL (SPONGE) ×2 IMPLANT
GAUZE PETROLATUM 1 X8 (GAUZE/BANDAGES/DRESSINGS) IMPLANT
GLOVE BIO SURGEON STRL SZ 6.5 (GLOVE) ×6 IMPLANT
GLOVE BIO SURGEON STRL SZ7 (GLOVE) IMPLANT
GLOVE BIOGEL PI IND STRL 7.0 (GLOVE) IMPLANT
GLOVE SURG SYN 6.5 ES PF (GLOVE) ×2 IMPLANT
GLOVE SURG SYN 6.5 PF PI (GLOVE) IMPLANT
GOWN SRG XL 47XLVL 3 REINF (GOWN DISPOSABLE) IMPLANT
GOWN STRL REIN XL LVL3 (GOWN DISPOSABLE) ×2
GOWN STRL REUS W/ TWL LRG LVL3 (GOWN DISPOSABLE) IMPLANT
GOWN STRL REUS W/TWL LRG LVL3 (GOWN DISPOSABLE) ×2
IRRIG SUCT STRYKERFLOW 2 WTIP (MISCELLANEOUS) ×2
IRRIGATION SUCT STRKRFLW 2 WTP (MISCELLANEOUS) ×2 IMPLANT
KIT PINK PAD W/HEAD ARE REST (MISCELLANEOUS) ×2
KIT PINK PAD W/HEAD ARM REST (MISCELLANEOUS) ×2 IMPLANT
KIT TURNOVER CYSTO (KITS) ×2 IMPLANT
LEGGING LITHOTOMY PAIR STRL (DRAPES) ×2 IMPLANT
MANIFOLD NEPTUNE II (INSTRUMENTS) ×2 IMPLANT
NDL INSUFFLATION 14GA 120MM (NEEDLE) ×2 IMPLANT
NEEDLE INSUFFLATION 14GA 120MM (NEEDLE) ×2 IMPLANT
OBTURATOR OPTICAL STND 8 DVNC (TROCAR) ×2
OBTURATOR OPTICALSTD 8 DVNC (TROCAR) ×2 IMPLANT
PACK ROBOT WH (CUSTOM PROCEDURE TRAY) ×2 IMPLANT
PACK ROBOTIC GOWN (GOWN DISPOSABLE) ×2 IMPLANT
PAD OB MATERNITY 4.3X12.25 (PERSONAL CARE ITEMS) ×2 IMPLANT
PAD PREP 24X48 CUFFED NSTRL (MISCELLANEOUS) ×2 IMPLANT
PROTECTOR NERVE ULNAR (MISCELLANEOUS) ×2 IMPLANT
RTRCTR WOUND ALEXIS 18CM SML (INSTRUMENTS)
SAVER CELL AAL HAEMONETICS (INSTRUMENTS) IMPLANT
SCISSORS LAP 5X35 DISP (ENDOMECHANICALS) IMPLANT
SCISSORS MNPLR CVD DVNC XI (INSTRUMENTS) ×2 IMPLANT
SEAL UNIV 5-12 XI (MISCELLANEOUS) ×4 IMPLANT
SEALER VESSEL EXT DVNC XI (MISCELLANEOUS) IMPLANT
SET TUBE FILTERED XL AIRSEAL (SET/KITS/TRAYS/PACK) ×2 IMPLANT
SLEEVE SCD COMPRESS KNEE MED (STOCKING) ×2 IMPLANT
SOL PREP POV-IOD 4OZ 10% (MISCELLANEOUS) IMPLANT
SPIKE FLUID TRANSFER (MISCELLANEOUS) ×4 IMPLANT
SUT VIC AB 0 CT1 27 (SUTURE)
SUT VIC AB 0 CT1 27XBRD ANBCTR (SUTURE) IMPLANT
SUT VIC AB 4-0 PS2 18 (SUTURE) ×4 IMPLANT
SUT VICRYL 0 UR6 27IN ABS (SUTURE) IMPLANT
TOWEL OR 17X24 6PK STRL BLUE (TOWEL DISPOSABLE) ×2 IMPLANT
WATER STERILE IRR 500ML POUR (IV SOLUTION) IMPLANT

## 2023-07-25 NOTE — Discharge Instructions (Signed)

## 2023-07-25 NOTE — Anesthesia Postprocedure Evaluation (Signed)
Anesthesia Post Note  Patient: Stephanie Gray I Fales  Procedure(s) Performed: XI ROBOTIC ASSISTED SALPINGECTOMY (Bilateral: Abdomen) XI ROBOTIC ASSISTED LAPAROSCOPIC EXTENSIVE LYSIS OF ADHESION (Abdomen) LAPAROSCOPY OPERATIVE (Abdomen)     Patient location during evaluation: Specials Recovery Anesthesia Type: General Level of consciousness: awake and alert Pain management: pain level controlled Vital Signs Assessment: vitals unstable Respiratory status: spontaneous breathing, nonlabored ventilation, respiratory function stable and patient connected to nasal cannula oxygen Cardiovascular status: unstable Postop Assessment: no apparent nausea or vomiting Anesthetic complications: no Comments: Met Dr Ellyn Hack in Pacu at [place] pt BP 68/ 51 . Abdomen at this time soft non tender . Pt only had 1200cc of LR 250 of albumin. .Additional albumin given, as well as additonal LR given. Initially hypotension responded to fluid. Istat sent. Pt then hypotensive again Dr Ellyn Hack called . Type and cross sent. Dr Ellyn Hack arrived quickly. Main OR called for emergency ex lap.   No notable events documented.  Last Vitals:  Vitals:   07/25/23 1730 07/25/23 1745  BP:  (!) 67/40  Pulse:    Resp:    Temp:    SpO2: 100%     Last Pain:  Vitals:   07/25/23 1745  TempSrc:   PainSc: 0-No pain                 Trevor Iha

## 2023-07-25 NOTE — Progress Notes (Signed)
Primary MD phoned, instructed to notify main [facility] OR for return, also Type and Screen ordered. Lab informed.

## 2023-07-25 NOTE — Brief Op Note (Signed)
07/25/2023  9:01 PM  PATIENT:  Stephanie Gray  57 y.o. female  PRE-OPERATIVE DIAGNOSIS:  bleeding  POST-OPERATIVE DIAGNOSIS:  post operative bleeding  PROCEDURE:  Procedure(s): DIAGNOSTIC LAPAROTOMY (N/A)  SURGEON:  Surgeons and Role:    * Bovard-Stuckert, , MD - Primary    * Banga, Cecilia Worema, DO - Assisting  ANESTHESIA:   general  EBL:  1800 mL IVF 1400cc, 2u ffp, 3u pRBCs  BLOOD ADMINISTERED:3 units PRBC and 2 units FFP  DRAINS: Urinary Catheter (Foley)   LOCAL MEDICATIONS USED:  NONE  SPECIMEN:  No Specimen  DISPOSITION OF SPECIMEN:  N/A  COUNTS:  YES  TOURNIQUET:  * No tourniquets in log *  DICTATION: .Other Dictation: Dictation Number [id number]  PLAN OF CARE: Admit for overnight observation  PATIENT DISPOSITION:  PACU - hemodynamically stable.   Delay start of Pharmacological VTE agent (>24hrs) due to surgical blood loss or risk of bleeding: not applicable

## 2023-07-25 NOTE — Anesthesia Procedure Notes (Signed)
Procedure Name: Intubation Date/Time: 07/25/2023 12:43 PM  Performed by: Bishop Limbo, CRNAPre-anesthesia Checklist: Patient identified, Emergency Drugs available, Suction available and Patient being monitored Patient Re-evaluated:Patient Re-evaluated prior to induction Oxygen Delivery Method: Circle System Utilized Preoxygenation: Pre-oxygenation with 100% oxygen Induction Type: IV induction Ventilation: Mask ventilation without difficulty Laryngoscope Size: Mac and 3 Grade View: Grade II Tube type: Oral Tube size: 7.0 mm Number of attempts: 1 Airway Equipment and Method: Stylet and Bite block Placement Confirmation: ETT inserted through vocal cords under direct vision, positive ETCO2 and breath sounds checked- equal and bilateral Secured at: 22 cm Tube secured with: Tape Dental Injury: Teeth and Oropharynx as per pre-operative assessment

## 2023-07-25 NOTE — OR Nursing (Signed)
16:20 Call to Dr. Richardson Landry to report Blood pressure 72/46 . Dr Richardson Landry comes to pacu to see patient.  Dr. Ellyn Hack arrives approx 16;30-16;45.  Patient is alert talking smiling . Orders received to hang albumin.  Albumin hung and fluids bumped up. Continue to monitor patient closely.  16:45 Dr. Richardson Landry orders patient another Albumin and to hand run in another bag of LR, as he feels paitent is dry.  Fluids continue to run in and patient is talking communication alert and oriented.  Approx 17:40 patients blood pressure drop to 69/45,57,21sats 100% on 3 liters nasal canula.  Call to Dr. Richardson Landry to report change in status of patient, patients color pale and abdomen feels distended. Peri pad has no blood on it . Orders received to get an H&H and type and screen.  Bmet. Lab called stat.  Dr. Ellyn Hack is on her way back. 1800 Dr. Ellyn Hack is back and patient is getting ready to go back to the OR.  Patient is still alert talking. Patient thanks staff for taking care of her. Margarita Mail rn

## 2023-07-25 NOTE — Progress Notes (Signed)
57yo female post op. Alert and oriented, MAE x 4, no complaints of pain. Began having episodes of hypotension without tachycardia. fluid bolus initiated. No improvement in NBP readings. BP cuff assessed and reapplied, NBP readings of 70's/ 40's (MAP of 50's). MDA (Dr. Richardson Landry) at bedside, fluid bolus and Albumin x 2 ordered and given. Order rec to complete one liter of LR from MDA. (When complete, client will have rec 3 liters of LR). Neuro: alert and oriented, follows commands, speech WNL. Cardiac: NSR noted on monitor of 70's, no ectopy, S1S2 noted. Radial pulses 1+, capillary refill WNL, skin color WNL, temp WNL. Resp: even and non-labored, Aline 2lpm in place, BS clear bilateraly. GI: abd soft, non-distended, BS x 4 noted, no complaints of nausea or pain. Peri Pad assessed by other RN, noted to clean and dry, no drainage noted at this time. Cont to monitor and observe, VS assessed q47min.

## 2023-07-25 NOTE — Anesthesia Procedure Notes (Signed)
Procedure Name: Intubation Date/Time: 07/25/2023 6:35 PM  Performed by: Epimenio Sarin, CRNAPre-anesthesia Checklist: Patient identified, Emergency Drugs available, Suction available, Patient being monitored and Timeout performed Patient Re-evaluated:Patient Re-evaluated prior to induction Oxygen Delivery Method: Circle system utilized Preoxygenation: Pre-oxygenation with 100% oxygen Induction Type: IV induction, Rapid sequence and Cricoid Pressure applied Laryngoscope Size: Mac and 4 Grade View: Grade II Tube type: Oral Tube size: 7.0 mm Number of attempts: 1 Airway Equipment and Method: Stylet Placement Confirmation: ETT inserted through vocal cords under direct vision, positive ETCO2 and breath sounds checked- equal and bilateral Secured at: 21 cm Tube secured with: Tape Dental Injury: Teeth and Oropharynx as per pre-operative assessment

## 2023-07-25 NOTE — Brief Op Note (Signed)
07/25/2023  2:57 PM  PATIENT:  Stephanie Gray  57 y.o. female  PRE-OPERATIVE DIAGNOSIS:  family history of ovarian cancerabdominal wall  POST-OPERATIVE DIAGNOSIS:  family history of ovarian cancer,extensive omental adhesion to anterior abdominal wall  PROCEDURE:  Procedure(s): XI ROBOTIC ASSISTED SALPINGECTOMY (Bilateral) XI ROBOTIC ASSISTED LAPAROSCOPIC EXTENSIVE LYSIS OF ADHESION LAPAROSCOPY OPERATIVE. LOA  SURGEON:  Surgeons and Role:    * Bovard-Stuckert, , MD - Primary  ASSISTANTS: Webb Silversmith RNFA   ANESTHESIA:   local and general  EBL:  50 mL IVF and uop per anesthesia  BLOOD ADMINISTERED:none  DRAINS: none   LOCAL MEDICATIONS USED:  MARCAINE     SPECIMEN:  Source of Specimen:  B fallopian tubes  DISPOSITION OF SPECIMEN:  PATHOLOGY  COUNTS:  YES  TOURNIQUET:  * No tourniquets in log *  DICTATION: .Other Dictation: Dictation Number [id number]  PLAN OF CARE: Discharge to home after PACU  PATIENT DISPOSITION:  PACU - hemodynamically stable.   Delay start of Pharmacological VTE agent (>24hrs) due to surgical blood loss or risk of bleeding: not applicable

## 2023-07-25 NOTE — Transfer of Care (Signed)
Immediate Anesthesia Transfer of Care Note  Patient: Stephanie Gray  Procedure(s) Performed: XI ROBOTIC ASSISTED SALPINGECTOMY (Bilateral: Abdomen) XI ROBOTIC ASSISTED LAPAROSCOPIC EXTENSIVE LYSIS OF ADHESION (Abdomen) LAPAROSCOPY OPERATIVE (Abdomen)  Patient Location: PACU  Anesthesia Type:General  Level of Consciousness: drowsy, patient cooperative, and responds to stimulation  Airway & Oxygen Therapy: Patient Spontanous Breathing and Patient connected to nasal cannula oxygen  Post-op Assessment: Report given to RN and Post -op Vital signs reviewed and stable  Post vital signs: Reviewed and stable  Last Vitals:  Vitals Value Taken Time  BP 102/61 07/25/23 1500  Temp    Pulse 78 07/25/23 1501  Resp 14 07/25/23 1501  SpO2 92 % 07/25/23 1501  Vitals shown include unfiled device data.  Last Pain:  Vitals:   07/25/23 1117  TempSrc: Oral  PainSc: 0-No pain      Patients Stated Pain Goal: 5 (07/25/23 1117)  Complications: No notable events documented.

## 2023-07-25 NOTE — Op Note (Signed)
Stephanie Gray, Stephanie Gray MEDICAL RECORD NO: [id number] ACCOUNT NO: [id number] DATE OF BIRTH: 10-23-1966 FACILITY: [facility] LOCATION: [facility] PHYSICIAN: Sherian Rein, MD  Operative Report   DATE OF PROCEDURE: 07/25/2023  PREOPERATIVE DIAGNOSIS:  Family history of ovarian cancer, desires bilateral salpingectomy.  POSTOPERATIVE DIAGNOSIS:  Family history of ovarian cancer, desires bilateral salpingectomy, status post bilateral salpingectomy.  Also, lysis of extensive omental adhesions to anterior abdominal wall.  PROCEDURE:  Operative laparoscopy, lysis of adhesions as well as da Vinci assisted laparoscopic extensive lysis of adhesions and bilateral salpingectomy.  SURGEON:  Sherian Rein, MD  ASSISTANT:  Webb Silversmith, RNFA  ANESTHESIA:  Local and general.  ESTIMATED BLOOD LOSS:  50 mL.  IV FLUID AND URINE OUTPUT: Per anesthesia.  COMPLICATIONS:  Unexpected extensive anterior abdominal wall adhesions of omentum, taking at least 60 minutes to lyse as well as get access into the peritoneum.  PATHOLOGY: Bilateral fallopian tubes with tie on the left.  DESCRIPTION OF PROCEDURE:  After informed consent was reviewed with the patient including risks, benefits and alternatives of the surgical procedure, she was transferred to the operating room and placed on the table in supine position.  General  anesthesia was induced and found to be adequate.  She was then placed in the [place]in stirrups, prepped and draped in the normal sterile fashion.  After an appropriate timeout was performed using an open-sided speculum, a Hulka manipulator was placed on  her cervix as well as a Foley catheter was placed in her bladder.  The attention was then turned to the abdominal portion of the case.  An 8 mm incision was made superior to her umbilicus, carried through the underlying layer of fascia sharply using a  Veress needle.  An attempt at a pneumoperitoneum was made, however, the  hanging drop test was passed and with an opening pressure of 1 mmHg pneumo was obtained, however, the trocar was attempted to be placed under direct visualization and was unable to  quick conversion to an open laparoscopy. Access was gained and with the trocar visualization revealed extensive adhesions with inability to visualize uterus due to extensive omental to anterior abdominal wall, omentum to lower abdominal wall, also  uterine to bladder peritoneum adhesions. The decision was made to proceed with laparoscopy and accessory port was placed in the right under direct visualization and using these 2 ports, the adhesions were lysed using laparoscopic scissors.  An accessory  port once visualization was gained was able to be placed on the left.  This was used to take down the adhesions more when it became visible to dock with the robot.  The robot was docked and surgeon started the console.  Meanwhile lower adhesions were  lysed with robotic assistance and the bilateral salpingectomy was performed.  Hemostasis was confirmed.  Second look laparoscopy and placed the patient in reverse Trendelenburg helped to evacuate some blood and clot.  The areas that had been adhesed were  carefully inspected and no active bleeding was noted.  The robot was undocked and continuation of inspection with laparoscopic instrumentation allowed [place] to clear more clot.  Instrumentation was removed.  The pneumoperitoneum was removed.  The incisions  were closed with 4-0 Vicryl and Dermabond.  The umbilical incision had a previous stitch placed and was used to reapproximate the fascia.  The patient tolerated the procedure well.  Sponge, lap and needle counts were correct x2 per the operating staff.   PUS D: 07/25/2023 3:14:03 pm T: 07/25/2023 4:00:00 pm  JOB: [id number]/  [id number]  

## 2023-07-25 NOTE — Progress Notes (Signed)
Dr. Hinton Rao and Dr. Druscilla Brownie by bedside to evaluate and take patient to OR for exploration. Consent obtained by Dr. Wayland Salinas. Patient transported on portable monitor with Oxygen at 4 L/min via nasal cannula. Patient with bradycardia, oxygen saturations stable, BP dropping from initial assessment. IV LR infusing in bilateral hands. Sites WNL. ABD firm, skin pale, demonstrating altered mental status.

## 2023-07-25 NOTE — Progress Notes (Signed)
HR now in the low 40's, NBP remains at 65/34, no complaints of shortness of breath, no chest pain, does complaint of abd discomfort, "my abdomen feels tight" abd assessment indicates taught abd with hypoactive BS. No complaints of nausea. Client remains alert and oriented. Dr. Richardson Landry, anesthesia has ordered lab work, Primary MD [facility]) phoned and updated of pt situation.

## 2023-07-25 NOTE — Anesthesia Preprocedure Evaluation (Signed)
Anesthesia Evaluation  Patient identified by MRN, date of birth, ID band Patient awake    Reviewed: Allergy & Precautions, NPO status , Patient's Chart, lab work & pertinent test results  Airway Mallampati: II  TM Distance: >3 FB Neck ROM: Full    Dental no notable dental hx. (+) Teeth Intact, Dental Advisory Given   Pulmonary neg pulmonary ROS   Pulmonary exam normal        Cardiovascular hypertension, Pt. on medications Normal cardiovascular exam     Neuro/Psych  PSYCHIATRIC DISORDERS Anxiety Depression     Neuromuscular disease    GI/Hepatic negative GI ROS, Neg liver ROS,,,  Endo/Other  Hypothyroidism    Renal/GU negative Renal ROS     Musculoskeletal  (+) Arthritis ,    Abdominal   Peds  Hematology negative hematology ROS (+)   Anesthesia Other Findings family history of ovarian cancer  Reproductive/Obstetrics                              Anesthesia Physical Anesthesia Plan  ASA: 2 and emergent  Anesthesia Plan: General   Post-op Pain Management: Ketamine IV*   Induction: Intravenous, Rapid sequence and Cricoid pressure planned  PONV Risk Score and Plan: 4 or greater and Ondansetron and Treatment may vary due to age or medical condition  Airway Management Planned: Oral ETT  Additional Equipment: None  Intra-op Plan:   Post-operative Plan: Possible Post-op intubation/ventilation  Informed Consent: I have reviewed the patients History and Physical, chart, labs and discussed the procedure including the risks, benefits and alternatives for the proposed anesthesia with the patient or authorized representative who has indicated his/her understanding and acceptance.     Dental advisory given  Plan Discussed with: CRNA  Anesthesia Plan Comments: (Emergent rexploration of abdomen for hypotension)        Anesthesia Quick Evaluation

## 2023-07-25 NOTE — Op Note (Signed)
NAMEDUA, DISHON MEDICAL RECORD NO: [id number] ACCOUNT NO: [id number] DATE OF BIRTH: [birth year] FACILITY: [facility] LOCATION: [facility] PHYSICIAN: Sherian Rein, MD  Operative Report   DATE OF PROCEDURE: 07/25/2023  PREOPERATIVE DIAGNOSIS:  Postoperative bleeding.  POSTOPERATIVE DIAGNOSIS:  Postoperative bleeding.  PROCEDURE PERFORMED:  Diagnostic laparotomy.  SURGEON: Sherian Rein, MD  ASSISTING:  Pryor Ochoa, DO  ANESTHESIA:  General.  ESTIMATED BLOOD LOSS:  1800 mL  INTRAVENOUS FLUIDS:  1400 mL and 2 units FFP and 3 units packed red blood cells.  COMPLICATIONS: Early in the day, the patient had operative and robot-assisted bilateral fimbriectomy and was found to have a surgical abdomen in the PACU and feeling sick and H and H was 6.  She was transported to the operating room - decision was made to proceed with laparotomy.  DESCRIPTION OF PROCEDURE:  After the patient was prepped and draped and Foley catheter was placed, there was an appropriate timeout. Her supraumbilical incision was extended to accommodate a Buyer, retail.  The omentum was inspected.  Several  areas of oozing were noted and cauterized with Bovie cautery.  There was a large hole in the omentum as well.  The omentum was separated so as not to have bowel obstruction later. No definitive areas of large blood flow were found.  However, a large  amount of blood was evacuated from her peritoneal cavity. As we were unable to visualize the sites of the fimbriectomy, the decision was made to perform a mini lap Pfannenstiel incision to examine this area.  This area was found to be hemostatic.   Incisions were closed with 0 Vicryl as a deep stitch and the skin was closed with 4-0 Vicryl.  Benzoin and Steris were applied.  The patient tolerated the procedure well.  During the procedure, she received 3 units packed red blood cells.  Her i-STAT  was 11.9 after the units were transfused.  She was  stable.  She was awakened.  The plan was to perform serial H and H's and keep her in the hospital to observe.  She was awakened in stable condition.  Sponge, lap and needle counts were correct x 2 per  the operating room staff.   SHW D: 07/25/2023 9:07:31 pm T: 07/25/2023 9:25:00 pm  JOB: [id number]/ [id number]

## 2023-07-25 NOTE — Interval H&P Note (Signed)
History and Physical Interval Note:  07/25/2023 12:18 PM  Stephanie Gray  has presented today for surgery, with the diagnosis of family history of ovarian cancer.  The various methods of treatment have been discussed with the patient and family. After consideration of risks, benefits and other options for treatment, the patient has consented to  Procedure(s): XI ROBOTIC ASSISTED SALPINGECTOMY (Bilateral) CYSTOSCOPY, possible as a surgical intervention.  The patient's history has been reviewed, patient examined, no change in status, stable for surgery.  I have reviewed the patient's chart and labs.  Questions were answered to the patient's satisfaction.      Bovard-Stuckert

## 2023-07-25 NOTE — Transfer of Care (Signed)
Immediate Anesthesia Transfer of Care Note  Patient: Stephanie Gray  Procedure(s) Performed: DIAGNOSTIC LAPAROTOMY  Patient Location: PACU  Anesthesia Type:General  Level of Consciousness: drowsy and patient cooperative  Airway & Oxygen Therapy: Patient Spontanous Breathing and Patient connected to face mask oxygen  Post-op Assessment: Report given to RN and Post -op Vital signs reviewed and stable  Post vital signs: Reviewed and stable  Last Vitals:  Vitals Value Taken Time  BP 105/63 07/25/23 2109  Temp 36.7 C 07/25/23 2109  Pulse    Resp 21 07/25/23 2112  SpO2    Vitals shown include unfiled device data.  Last Pain:  Vitals:   07/25/23 1745  TempSrc:   PainSc: 0-No pain      Patients Stated Pain Goal: 5 (07/25/23 1117)  Complications: No notable events documented.

## 2023-07-25 NOTE — Anesthesia Preprocedure Evaluation (Addendum)
Anesthesia Evaluation  Patient identified by MRN, date of birth, ID band Patient awake    Reviewed: Allergy & Precautions, NPO status , Patient's Chart, lab work & pertinent test results  Airway Mallampati: II  TM Distance: >3 FB Neck ROM: Full    Dental no notable dental hx.    Pulmonary neg pulmonary ROS   Pulmonary exam normal        Cardiovascular hypertension, Pt. on medications Normal cardiovascular exam     Neuro/Psych  PSYCHIATRIC DISORDERS Anxiety Depression     Neuromuscular disease    GI/Hepatic negative GI ROS, Neg liver ROS,,,  Endo/Other  Hypothyroidism    Renal/GU negative Renal ROS     Musculoskeletal  (+) Arthritis ,    Abdominal   Peds  Hematology negative hematology ROS (+)   Anesthesia Other Findings family history of ovarian cancer  Reproductive/Obstetrics                             Anesthesia Physical Anesthesia Plan  ASA: 2  Anesthesia Plan: General   Post-op Pain Management:    Induction: Intravenous  PONV Risk Score and Plan: 4 or greater and Ondansetron, Dexamethasone, Midazolam, Scopolamine patch - Pre-op, Diphenhydramine and Treatment may vary due to age or medical condition  Airway Management Planned: Oral ETT  Additional Equipment:   Intra-op Plan:   Post-operative Plan: Extubation in OR  Informed Consent: I have reviewed the patients History and Physical, chart, labs and discussed the procedure including the risks, benefits and alternatives for the proposed anesthesia with the patient or authorized representative who has indicated his/her understanding and acceptance.     Dental advisory given  Plan Discussed with: CRNA  Anesthesia Plan Comments:        Anesthesia Quick Evaluation

## 2023-07-25 NOTE — Anesthesia Postprocedure Evaluation (Signed)
Anesthesia Post Note  Patient: Stephanie Gray  Procedure(s) Performed: DIAGNOSTIC LAPAROTOMY     Patient location during evaluation: PACU Anesthesia Type: General Level of consciousness: awake and alert Pain management: pain level controlled Vital Signs Assessment: post-procedure vital signs reviewed and stable Respiratory status: spontaneous breathing, nonlabored ventilation, respiratory function stable and patient connected to nasal cannula oxygen Cardiovascular status: blood pressure returned to baseline and stable Postop Assessment: no apparent nausea or vomiting Anesthetic complications: no   No notable events documented.  Last Vitals:  Vitals:   07/25/23 2215 07/25/23 2229  BP: 103/63   Pulse: 83 79  Resp: (!) 21 20  Temp:  37 C  SpO2: 99% 99%    Last Pain:  Vitals:   07/25/23 2125  TempSrc:   PainSc: 5                  Trevor Iha

## 2023-07-26 ENCOUNTER — Encounter (HOSPITAL_COMMUNITY): Payer: Self-pay | Admitting: Obstetrics and Gynecology

## 2023-07-26 ENCOUNTER — Other Ambulatory Visit: Payer: Self-pay

## 2023-07-26 LAB — CBC
HCT: 25.3 % — ABNORMAL LOW (ref 36.0–46.0)
Hemoglobin: 8.1 g/dL — ABNORMAL LOW (ref 12.0–15.0)
MCH: 29.6 pg (ref 26.0–34.0)
MCHC: 32 g/dL (ref 30.0–36.0)
MCV: 92.3 fL (ref 80.0–100.0)
Platelets: 139 10*3/uL — ABNORMAL LOW (ref 150–400)
RBC: 2.74 MIL/uL — ABNORMAL LOW (ref 3.87–5.11)
RDW: 14 % (ref 11.5–15.5)
WBC: 12.2 10*3/uL — ABNORMAL HIGH (ref 4.0–10.5)
nRBC: 0 % (ref 0.0–0.2)

## 2023-07-26 MED ORDER — CEFAZOLIN SODIUM-DEXTROSE 2-4 GM/100ML-% IV SOLN
2.0000 g | Freq: Three times a day (TID) | INTRAVENOUS | Status: AC
  Administered 2023-07-26 (×2): 2 g via INTRAVENOUS
  Filled 2023-07-26 (×2): qty 100

## 2023-07-26 MED ORDER — OXYCODONE-ACETAMINOPHEN 5-325 MG PO TABS: 1.0000 | ORAL_TABLET | Freq: Four times a day (QID) | ORAL | 0 refills | Status: DC | PRN

## 2023-07-26 MED ORDER — ORAL CARE MOUTH RINSE: 15.0000 mL | OROMUCOSAL | Status: DC | PRN

## 2023-07-26 MED ORDER — CHLORHEXIDINE GLUCONATE CLOTH 2 % EX PADS
6.0000 | MEDICATED_PAD | Freq: Every day | CUTANEOUS | Status: DC
  Administered 2023-07-26: 6 via TOPICAL

## 2023-07-26 NOTE — Progress Notes (Signed)
1 Day Post-Op Procedure(s) (LRB): DIAGNOSTIC LAPAROTOMY (N/A)  Subjective: Patient reports incisional pain, tolerating PO, and no problems voiding.  Ambulating well/  Pain controlled.  No flatus.  D/W pt d/c plan.  Will follow up  Objective: I have reviewed patient's vital signs, intake and output, medications, and labs.  General: alert and no distress Resp: clear to auscultation bilaterally Cardio: regular rate and rhythm GI: soft, non-tender; bowel sounds normal; no masses,  no organomegaly Extremities: extremities normal, atraumatic, no cyanosis or edema Inc: C/D/I  Assessment: s/p Procedure(s): DIAGNOSTIC LAPAROTOMY (N/A): stable and progressing well  Plan: Encourage ambulation Advance to PO medication Discontinue IV fluids Discharge home if CBC is stable, ambulating, voiding, tolerating po, pain controlled  LOS: 1 day    Sherian Rein, MD 07/26/2023, 12:44 PM

## 2023-07-26 NOTE — Progress Notes (Addendum)
Per chart review patient currently at [facility] SDU for exploratory laparotomy.    No current TOC needs.    07/26/23 1331  TOC Brief Assessment  Insurance and Status Reviewed  Patient has primary care physician Yes  Home environment has been reviewed home with spouse  Prior level of function: independent  Prior/Current Home Services No current home services  Social Determinants of Health Reivew SDOH reviewed no interventions necessary  Readmission risk has been reviewed Yes  Transition of care needs no transition of care needs at this time

## 2023-07-26 NOTE — Progress Notes (Signed)
1 Day Post-Op Procedure(s) (LRB): DIAGNOSTIC LAPAROTOMY (N/A)  Subjective: Patient reports incisional pain and tolerating PO (drinking water).  Will decrease IVF, SLIV if continued tol pl.  Pain controlled.  Ancef for 24 hr  Objective: I have reviewed patient's vital signs, intake and output, medications, and labs.  General: alert and no distress Resp: clear to auscultation bilaterally Cardio: regular rate and rhythm GI: soft, non-tender; bowel sounds normal; no masses,  no organomegaly Extremities: extremities normal, atraumatic, no cyanosis or edema Inc: C/D/I  Assessment: s/p Procedure(s): DIAGNOSTIC LAPAROTOMY (N/A): stable and progressing well  Plan: Advance diet Encourage ambulation Advance to PO medication Due to large volume blood loss, Ancef for 24 hrs.   Poss d/c this pm, if ambulating, voiding, tol po, pain controlled  LOS: 1 day    Sherian Rein, MD 07/26/2023, 7:00 AM

## 2023-07-26 NOTE — Progress Notes (Signed)
Patient walked one lap around hallway with RN supervision on RA with front wheel walker. Pt tolerated activity fairly well, with some shortness of breath without significant oxygen desaturation. This RN will continue to encourage activity as tolerated.

## 2023-07-26 NOTE — Progress Notes (Signed)
AVS instructions reviewed with pt per orders. All questions answered at this time. All patient belongings returned to patient. PIVs removed. Patient was transported downstairs to lobby by NT. Patient was picked up by family to go home.

## 2023-07-26 NOTE — Plan of Care (Signed)
Discussed with patient plan of care for the evening, pain management and admission questions with some teach back displayed.  What is important to her is she receives her night time progesterone medication. Problem: Education: Goal: Knowledge of General Education information will improve Description: Including pain rating scale, medication(s)/side effects and non-pharmacologic comfort measures Outcome: Progressing   Problem: Health Behavior/Discharge Planning: Goal: Ability to manage health-related needs will improve Outcome: Progressing

## 2023-07-26 NOTE — Discharge Summary (Signed)
Physician Discharge Summary  Patient ID: Stephanie Gray MRN: [id number] DOB/AGE: August 20, 1966 57 y.o.  Admit date: 07/25/2023 Discharge date: 07/26/2023  Admission Diagnoses: family history of ovarian cancer, post op bleeding  Discharge Diagnoses:    Discharged Condition: good  Hospital Course: admitted for B salpingectomy, extensive adhesions and LOA, post op bleeding - exlap to evaluate.  Coagulate small oozing area, evacuation of hemoperitoneum. Tranfused 3u prbcs.  POD#1 ambulating, voiding, tol po, pain controlled, CBC stable.  D/C to home  Consults: None  Significant Diagnostic Studies: labs: CBC  Treatments: surgery: RA B salpingectomy, LOA, laparoscopic LOA; exlap - drainage of hemoperitoneum and coag of bleeding  Discharge Exam: Blood pressure (!) 99/56, pulse 80, temperature 98.7 F (37.1 C), temperature source Oral, resp. rate 16, height 5\' 2"  (1.575 m), weight 69.5 kg, last menstrual period 06/19/2020, SpO2 96%. Gen NAD CV RRR Lungs CTAB Abd soft, NT, ND, quiet BS Ext sym, NT Inc C/D/I, some bruising  Disposition: Discharge disposition: 01-Home or Self Care       Discharge Instructions     Call MD for:  persistant nausea and vomiting   Complete by: As directed    Call MD for:  persistant nausea and vomiting   Complete by: As directed    Call MD for:  redness, tenderness, or signs of infection (pain, swelling, redness, odor or green/yellow discharge around incision site)   Complete by: As directed    Call MD for:  redness, tenderness, or signs of infection (pain, swelling, redness, odor or green/yellow discharge around incision site)   Complete by: As directed    Call MD for:  severe uncontrolled pain   Complete by: As directed    Call MD for:  severe uncontrolled pain   Complete by: As directed    Care order/instruction   Complete by: As directed    Pt got postop meds in office   Diet - low sodium heart healthy   Complete by: As directed    Diet  - low sodium heart healthy   Complete by: As directed    Discharge instructions   Complete by: As directed    Call [phone] with questions or problems (or Dr Ellyn Hack [phone])   Discharge instructions   Complete by: As directed    Call [phone] with questions or problems (Dr Ellyn Hack [phone])   Driving Restrictions   Complete by: As directed    While taking strong pain medicines   Driving Restrictions   Complete by: As directed    While taking strong pain medicine   Increase activity slowly   Complete by: As directed    Increase activity slowly   Complete by: As directed    Lifting restrictions   Complete by: As directed    No greater than 10-15lbs for 4-6 weeks   Lifting restrictions   Complete by: As directed    No greater than 10-15lbs for 6 weeks   May shower / Bathe   Complete by: As directed    May shower / Bathe   Complete by: As directed    May walk up steps   Complete by: As directed    May walk up steps   Complete by: As directed    Sexual Activity Restrictions   Complete by: As directed    Pelvic rest - no douching, tampons or sex while bleeding   Sexual Activity Restrictions   Complete by: As directed    Pelvic rest - no douching, tampons or sex  while bleeding      Allergies as of 07/26/2023       Reactions   Benzodiazepines Other (See Comments)   Patient needed crash cart after tubal ligation   Other Other (See Comments)   Patient is extremely sensitive to all medicines, maybe needs 1/4 or 1/8 dosage of normal adult   Augmentin [amoxicillin-pot Clavulanate] Other (See Comments)   Shaky chills   Ozempic (0.25 Or 0.5 Mg-dose) [semaglutide(0.25 Or 0.5mg -dos)] Nausea And Vomiting   Lamictal [lamotrigine] Rash   Penicillins Rash   Rash because of a history of documented adverse serious drug reaction;Medi Alert bracelet  is recommended (CHILDHOOD REACTION) Has patient had a PCN reaction causing immediate rash, facial/tongue/throat swelling, SOB  or lightheadedness with hypotension:UNKNOWN Has patient had a PCN reaction causing severe rash involving mucus membranes or skin necrosis:unsure Has patient had a PCN reaction that required hospitalization:unsure Has patient had a PCN reaction occurring within the last 10 years:unsure        Medication List     TAKE these medications    acetaminophen 500 MG tablet Commonly known as: TYLENOL Take 2 tablets (1,000 mg total) by mouth every 6 (six) hours as needed for mild pain or fever. What changed: how much to take   amLODipine-valsartan 5-160 MG tablet Commonly known as: Exforge Take 1 tablet by mouth daily.   Armour Thyroid 30 MG tablet Generic drug: thyroid Take 30 mg by mouth daily before breakfast.   B-12 METHYLCOBALAMIN PO Take by mouth. B 12 DAILY   BLUE-EMU HEMP EX Take 1 tablet by mouth daily. Hemp muscadine   cholecalciferol 25 MCG (1000 UNIT) tablet Commonly known as: VITAMIN D3 Take 1,000 Units by mouth daily.   co-enzyme Q-10 30 MG capsule Take 30 mg by mouth daily.   DULoxetine 60 MG capsule Commonly known as: CYMBALTA Take 1 capsule (60 mg total) by mouth daily.   Fish Oil 435 MG Caps Take by mouth.   oxyCODONE-acetaminophen 5-325 MG tablet Commonly known as: PERCOCET/ROXICET Take 1-2 tablets by mouth every 6 (six) hours as needed for severe pain (DO NOT TAKE W TYLENOL).   polyethylene glycol 17 g packet Commonly known as: MIRALAX / GLYCOLAX Take 17 g by mouth daily as needed for mild constipation.   PRESCRIPTION MEDICATION SQ ESTROGEN IMPLANT CHANGE EVERY 3 MONTHS   progesterone 100 MG capsule Commonly known as: PROMETRIUM Take 100 mg by mouth at bedtime.        Follow-up Information     Bovard-Stuckert, , MD. Schedule an appointment as soon as possible for a visit in 2 week(s).   Specialty: Obstetrics and Gynecology Why: For postop check Contact information: [address] [place] [address] [phone]                  Signed: Sherian Rein 07/26/2023, 1:13 PM

## 2023-07-28 ENCOUNTER — Encounter: Payer: Self-pay | Admitting: *Deleted

## 2023-07-28 ENCOUNTER — Telehealth: Payer: Self-pay | Admitting: *Deleted

## 2023-07-28 NOTE — Transitions of Care (Post Inpatient/ED Visit) (Signed)
   07/28/2023  Name: Stephanie Gray MRN: [id number] DOB: 1966/02/28  Today's TOC FU Call Status: Today's TOC FU Call Status:: Unsuccessful Call (1st Attempt) Unsuccessful Call (1st Attempt) Date: 07/28/23  Attempted to reach the patient regarding the most recent Inpatient visit; left HIPAA compliant voice message requesting call back  Follow Up Plan: Additional outreach attempts will be made to reach the patient to complete the Transitions of Care (Post Inpatient visit) call.   Caryl Pina, RN, BSN, CCRN Alumnus RN CM Care Coordination/ Transition of Care- [facility] Care Management [phone]: direct office

## 2023-07-29 ENCOUNTER — Telehealth: Payer: Self-pay | Admitting: *Deleted

## 2023-07-29 ENCOUNTER — Encounter: Payer: Self-pay | Admitting: *Deleted

## 2023-07-29 NOTE — Transitions of Care (Post Inpatient/ED Visit) (Signed)
   07/29/2023  Name: Stephanie Gray MRN: [id number] DOB: [birth year]  Today's TOC FU Call Status: Today's TOC FU Call Status:: Unsuccessful Call (2nd Attempt) Unsuccessful Call (2nd Attempt) Date: 07/29/23  Attempted to reach the patient regarding the most recent Inpatient visit; left HIPAA compliant voice message requesting call back  Follow Up Plan: Additional outreach attempts will be made to reach the patient to complete the Transitions of Care (Post Inpatient visit) call.   Caryl Pina, RN, BSN, CCRN Alumnus RN CM Care Coordination/ Transition of Care- [facility] Care Management [phone]: direct office

## 2023-08-01 ENCOUNTER — Encounter: Payer: Self-pay | Admitting: *Deleted

## 2023-08-01 ENCOUNTER — Telehealth: Payer: Self-pay | Admitting: *Deleted

## 2023-08-01 NOTE — Transitions of Care (Post Inpatient/ED Visit) (Signed)
   08/01/2023  Name: Stephanie Gray MRN: [id number] DOB: 07/20/1966  Today's TOC FU Call Status: Today's TOC FU Call Status:: Unsuccessful Call (3rd Attempt) Unsuccessful Call (3rd Attempt) Date: 08/01/23  Attempted to reach the patient regarding the most recent Inpatient visit; left HIPAA compliant voice message requesting call back  Follow Up Plan: No further outreach attempts will be made at this time. We have been unable to contact the patient.  Caryl Pina, RN, BSN, CCRN Alumnus RN CM Care Coordination/ Transition of Care- [facility] Care Management [phone]: direct office

## 2023-08-01 NOTE — Transitions of Care (Post Inpatient/ED Visit) (Signed)
08/01/2023  Name: Stephanie Gray MRN: [id number] DOB: June 14, 1966  Today's TOC FU Call Status: Today's TOC FU Call Status:: Successful TOC FU Call Completed TOC FU Call Complete Date: 08/01/23  Transition Care Management Follow-up Telephone Call Date of Discharge: 07/26/23 Discharge Facility: [facility]) Type of Discharge: Inpatient Admission Primary Inpatient Discharge Diagnosis:: surgical exploratory laparotomy How have you been since you were released from the hospital?: Better ("I've been fine- a little foggy after the surgery, but back to my normal self, not having any problems") Any questions or concerns?: No  Items Reviewed: Did you receive and understand the discharge instructions provided?: Yes (thoroughly reviewed with patient who verbalizes good understanding of same) Medications obtained,verified, and reconciled?: Yes (Medications Reviewed) (Full medication reconciliation/ review completed; no concerns or discrepancies identified; confirmed patient obtained/ is taking all newly Rx'd medications as instructed; self-manages medications and denies questions/ concerns around medications today) Any new allergies since your discharge?: No Dietary orders reviewed?: Yes Type of Diet Ordered:: BRAT- progressing gradually to heart healthy Do you have support at home?: Yes People in Home: spouse Name of Support/Comfort Primary Source: Reports independent in self-care activities; supportive spouse assists as/ if needed/ indicated  Medications Reviewed Today: Medications Reviewed Today     Reviewed by Michaela Corner, RN (Registered Nurse) on 08/01/23 at 1002  Med List Status: <None>   Medication Order Taking? Sig Documenting Provider Last Dose Status Informant  acetaminophen (TYLENOL) 500 MG tablet [id number] Yes Take 2 tablets (1,000 mg total) by mouth every 6 (six) hours as needed for mild pain or fever.  Patient taking differently: Take 500 mg by mouth every 6 (six)  hours as needed for mild pain or fever.   Juliet Rude, PA-C Taking Active Self, Pharmacy Records  amLODipine-valsartan [facility]) 5-160 MG tablet [id number] Yes Take 1 tablet by mouth daily. Olive Bass, FNP Taking Active Self, Pharmacy Records  ARMOUR THYROID 30 MG tablet [id number] Yes Take 30 mg by mouth daily before breakfast. [provider] Taking Active Self, Pharmacy Records  B-12 METHYLCOBALAMIN PO [id number] Yes Take by mouth. B 12 DAILY [provider] Taking Active Self, Pharmacy Records  cholecalciferol (VITAMIN D3) 25 MCG (1000 UNIT) tablet [id number] Yes Take 1,000 Units by mouth daily. [provider] Taking Active Self, Pharmacy Records  co-enzyme Q-10 30 MG capsule [id number] Yes Take 30 mg by mouth daily. [provider] Taking Active Self, Pharmacy Records  DULoxetine (CYMBALTA) 60 MG capsule [id number] Yes Take 1 capsule (60 mg total) by mouth daily. Cleotis Nipper, MD Taking Active Self, Pharmacy Records  Omega-3 Fatty Acids (FISH OIL) 435 MG CAPS [id number] Yes Take by mouth. [provider] Taking Active Self, Pharmacy Records  oxyCODONE-acetaminophen (PERCOCET/ROXICET) 5-325 MG tablet [id number] Yes Take 1-2 tablets by mouth every 6 (six) hours as needed for severe pain (DO NOT TAKE W TYLENOL). Sherian Rein, MD Taking Active            Med Note Marilu Favre Aug 01, 2023  9:58 AM) 08/01/23: reports during [facility] call, she obtained medication but has not needed/ has not taken post-recent surgery  polyethylene glycol (MIRALAX / GLYCOLAX) 17 g packet [id number] Yes Take 17 g by mouth daily as needed for mild constipation. Juliet Rude, PA-C Taking Active Self, Pharmacy Records  PRESCRIPTION MEDICATION [id number] Yes SQ ESTROGEN IMPLANT CHANGE EVERY 3 MONTHS [provider] Taking Active Self, Pharmacy Records  progesterone (PROMETRIUM) 100 MG capsule [id number] Yes  Take 100 mg by mouth at bedtime.  [provider] Taking Active Self, Pharmacy Records  Trolamine Salicylate (BLUE-EMU HEMP EX) [id number] Yes Take 1 tablet by mouth daily. Hemp muscadine [provider] Taking Active Self, Pharmacy Records           Home Care and Equipment/Supplies: Were Home Health Services Ordered?: No Any new equipment or medical supplies ordered?: No  Functional Questionnaire: Do you need assistance with bathing/showering or dressing?: No Do you need assistance with meal preparation?: No Do you need assistance with eating?: No Do you have difficulty maintaining continence: No Do you need assistance with getting out of bed/getting out of a chair/moving?: No Do you have difficulty managing or taking your medications?: No  Follow up appointments reviewed: PCP Follow-up appointment confirmed?: NA (verified not indicated per hospital discharging provider discharge notes) Specialist Hospital Follow-up appointment confirmed?: No Reason Specialist Follow-Up Not Confirmed: Patient has Specialist Provider Number and will Call for Appointment Do you need transportation to your follow-up appointment?: No Do you understand care options if your condition(s) worsen?: Yes-patient verbalized understanding  SDOH Interventions Today    Flowsheet Row Most Recent Value  SDOH Interventions   Food Insecurity Interventions Intervention Not Indicated  Transportation Interventions Intervention Not Indicated  [normally drives self,  husband assisting post-recent surgery]      TOC Interventions Today    Flowsheet Row Most Recent Value  TOC Interventions   TOC Interventions Discussed/Reviewed TOC Interventions Discussed, Post op wound/incision care  [Patient declines need for ongoing/ further care coordination outreach,  no care coordination needs identified at time of TOC call today,  provided my direct contact information should questions/ concerns/ needs arise post-TOC call]      Interventions  Today    Flowsheet Row Most Recent Value  Chronic Disease   Chronic disease during today's visit Other  [recent surgery,  exploratory laparotomy]  General Interventions   General Interventions Discussed/Reviewed General Interventions Discussed, Doctor Visits, Durable Medical Equipment (DME)  Doctor Visits Discussed/Reviewed PCP, Specialist, Doctor Visits Discussed  Durable Medical Equipment (DME) Other  [confirmed not currently requiring/ using assistive devices]  PCP/Specialist Visits Compliance with follow-up visit  Exercise Interventions   Exercise Discussed/Reviewed Exercise Discussed  [benefits of normal activity/ ambulation/ pacing activities after recent surgery]  Nutrition Interventions   Nutrition Discussed/Reviewed Nutrition Discussed  Pharmacy Interventions   Pharmacy Dicussed/Reviewed Pharmacy Topics Discussed  [Full medication review with updating medication list in EHR per patient report]      Caryl Pina, RN, BSN, CCRN Alumnus RN CM Care Coordination/ Transition of Care- [facility] Care Management [phone]: direct office

## 2023-09-27 ENCOUNTER — Ambulatory Visit
Admission: RE | Admit: 2023-09-27 | Discharge: 2023-09-27 | Disposition: A | Discharge status: Home / Self Care | Payer: BC Managed Care – PPO | Source: Ambulatory Visit | Attending: Obstetrics and Gynecology | Admitting: Obstetrics and Gynecology

## 2023-09-27 DIAGNOSIS — N958 Other specified menopausal and perimenopausal disorders: Secondary | ICD-10-CM | POA: Diagnosis not present

## 2023-09-27 DIAGNOSIS — E349 Endocrine disorder, unspecified: Secondary | ICD-10-CM | POA: Diagnosis not present

## 2023-09-27 DIAGNOSIS — E2839 Other primary ovarian failure: Secondary | ICD-10-CM

## 2023-09-29 ENCOUNTER — Ambulatory Visit
Admission: RE | Admit: 2023-09-29 | Discharge: 2023-09-29 | Disposition: A | Discharge status: Home / Self Care | Payer: BC Managed Care – PPO | Source: Ambulatory Visit | Attending: Obstetrics and Gynecology | Admitting: Obstetrics and Gynecology

## 2023-09-29 DIAGNOSIS — N6310 Unspecified lump in the right breast, unspecified quadrant: Secondary | ICD-10-CM

## 2023-09-29 DIAGNOSIS — N6315 Unspecified lump in the right breast, overlapping quadrants: Secondary | ICD-10-CM | POA: Diagnosis not present

## 2023-11-14 ENCOUNTER — Encounter (HOSPITAL_COMMUNITY): Payer: Self-pay | Admitting: Psychiatry

## 2023-11-14 ENCOUNTER — Telehealth (HOSPITAL_BASED_OUTPATIENT_CLINIC_OR_DEPARTMENT_OTHER): Payer: BC Managed Care – PPO | Admitting: Psychiatry

## 2023-11-14 VITALS — Wt 153.0 lb

## 2023-11-14 DIAGNOSIS — F419 Anxiety disorder, unspecified: Secondary | ICD-10-CM | POA: Diagnosis not present

## 2023-11-14 DIAGNOSIS — F330 Major depressive disorder, recurrent, mild: Secondary | ICD-10-CM

## 2023-11-14 MED ORDER — DULOXETINE HCL 60 MG PO CPEP
60.0000 mg | ORAL_CAPSULE | Freq: Every day | ORAL | 1 refills | Status: DC
Start: 2023-11-14 — End: 2024-04-23

## 2023-11-14 NOTE — Progress Notes (Signed)
[facility] MD Virtual Progress Note   Patient Location: Home Provider Location: Office  I connect with patient by telephone and verified that I am speaking with correct person by using two identifiers. I discussed the limitations of evaluation and management by telemedicine and the availability of in person appointments. I also discussed with the patient that there may be a patient responsible charge related to this service. The patient expressed understanding and agreed to proceed.  Stephanie Gray [id number] 57 y.o.  11/14/2023 2:11 PM  History of Present Illness:  Patient is evaluated by phone session.  Her mother recently died after apparently for stage IV cancer.  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  Patient told it was frustrating dealing with the healthcare system because she could not get oxygen and not able to get help from hospice.  Patient is taking Cymbalta every day which is helping her depression.  Patient told her mother was 43 year old and she has support from her husband and her sister.  Patient told this the summer it will be 60 years of her parents marriage.  Patient's father is alive and she think a lot about him.  Patient also excited about upcoming trip to [place] because son is getting married.  Initially she thought that she cannot go because taking care of mother but now she is able to join up with other family members.  She reported sometimes difficult to sleep because of leg pain.  She reported not able to take care of herself because she was very busy taking care of the mother but starting earlier next year she is scheduled to see the doctor's.  She has no tremors, shakes or any EPS.  Denies any feeling of hopelessness or hopelessness.  She denies any suicidal thoughts.  She endorsed few pounds weight gain because she is not as if routine.  Past Psychiatric History: H/O depression and anxiety. Taking meds since  her son  born.  Took Paxil, Zoloft and propanolol for anxiety.  Lamictal caused rash. No h/o inpatient or suicidal. H/O episodic hypomanic-like symptoms as more energy and poor sleep but able to function. No h/o paranoia and hallucination.  Saw [name] for 10 years.    Outpatient Encounter Medications as of 11/14/2023  Medication Sig   acetaminophen (TYLENOL) 500 MG tablet Take 2 tablets (1,000 mg total) by mouth every 6 (six) hours as needed for mild pain or fever. (Patient taking differently: Take 500 mg by mouth every 6 (six) hours as needed for mild pain or fever.)   amLODipine-valsartan (EXFORGE) 5-160 MG tablet Take 1 tablet by mouth daily.   ARMOUR THYROID 30 MG tablet Take 30 mg by mouth daily before breakfast.   B-12 METHYLCOBALAMIN PO Take by mouth. B 12 DAILY   cholecalciferol (VITAMIN D3) 25 MCG (1000 UNIT) tablet Take 1,000 Units by mouth daily.   co-enzyme Q-10 30 MG capsule Take 30 mg by mouth daily.   DULoxetine (CYMBALTA) 60 MG capsule Take 1 capsule (60 mg total) by mouth daily.   Omega-3 Fatty Acids (FISH OIL) 435 MG CAPS Take by mouth.   oxyCODONE-acetaminophen (PERCOCET/ROXICET) 5-325 MG tablet Take 1-2 tablets by mouth every 6 (six) hours as needed for severe pain (DO NOT TAKE W TYLENOL).   polyethylene glycol (MIRALAX / GLYCOLAX) 17 g packet Take 17 g by mouth daily as needed for mild constipation.   PRESCRIPTION MEDICATION SQ ESTROGEN IMPLANT CHANGE EVERY 3 MONTHS  progesterone (PROMETRIUM) 100 MG capsule Take 100 mg by mouth at bedtime.   Trolamine Salicylate (BLUE-EMU HEMP EX) Take 1 tablet by mouth daily. Hemp muscadine   No facility-administered encounter medications on file as of 11/14/2023.    No results found for this or any previous visit (from the past 2160 hour(s)).   Psychiatric Specialty Exam: Physical Exam  Review of Systems  Musculoskeletal:        Leg pain    Weight 153 lb (69.4 kg), last menstrual period 06/19/2020.There is no height or  weight on file to calculate BMI.  General Appearance: NA  Eye Contact:  NA  Speech:  Normal Rate  Volume:  Normal  Mood:  Dysphoric  Affect:  NA  Thought Process:  Goal Directed  Orientation:  Full (Time, Place, and Person)  Thought Content:  Logical  Suicidal Thoughts:  No  Homicidal Thoughts:  No  Memory:  Immediate;   Good Recent;   Good Remote;   Good  Judgement:  Intact  Insight:  Present  Psychomotor Activity:  Normal  Concentration:  Concentration: Good and Attention Span: Good  Recall:  Good  Fund of Knowledge:  Good  Language:  Good  Akathisia:  No  Handed:  Right  AIMS (if indicated):     Assets:  Communication Skills Desire for Improvement Housing Resilience Social Support Talents/Skills Transportation  ADL's:  Intact  Cognition:  WNL  Sleep:  fair because of sciatic pain     Assessment/Plan: MDD (major depressive disorder), recurrent episode, mild (HCC) - Plan: DULoxetine (CYMBALTA) 60 MG capsule  Anxiety - Plan: DULoxetine (CYMBALTA) 60 MG capsule  Discussed recent loss of mother who died due to stage IV cancer.  Patient was primary caretaker with her sister.  Patient was not happy with the healthcare system as not able to get the resources and help with when her mother needs it.  She does not want to change the medication because she feel the Cymbalta helping.  She would like to keep the current dose of Cymbalta 60 mg daily.  Discussed medication side effects and benefits.  Recommend to call us back if she has any question or any concern.  Follow-up in 3 months.   Follow Up Instructions:     I discussed the assessment and treatment plan with the patient. The patient was provided an opportunity to ask questions and all were answered. The patient agreed with the plan and demonstrated an understanding of the instructions.   The patient was advised to call back or seek an in-person evaluation if the symptoms worsen or if the condition fails to improve as  anticipated.    Collaboration of Care: Other provider involved in patient's care AEB notes are available in epic to review  Patient/Guardian was advised Release of Information must be obtained prior to any record release in order to collaborate their care with an outside provider. Patient/Guardian was advised if they have not already done so to contact the registration department to sign all necessary forms in order for [place] to release information regarding their care.   Consent: Patient/Guardian gives verbal consent for treatment and assignment of benefits for services provided during this visit. Patient/Guardian expressed understanding and agreed to proceed.     I provided 21 minutes of non face to face time during this encounter.  Note: This document was prepared by [employer] voice dictation technology and any errors that results from this process are unintentional.    Cleotis Nipper, MD 11/14/2023

## 2023-12-19 ENCOUNTER — Other Ambulatory Visit: Payer: Self-pay | Admitting: Family

## 2024-03-08 DIAGNOSIS — E669 Obesity, unspecified: Secondary | ICD-10-CM | POA: Diagnosis not present

## 2024-03-08 DIAGNOSIS — Z1211 Encounter for screening for malignant neoplasm of colon: Secondary | ICD-10-CM | POA: Diagnosis not present

## 2024-03-08 DIAGNOSIS — Z860100 Personal history of colon polyps, unspecified: Secondary | ICD-10-CM | POA: Diagnosis not present

## 2024-03-23 DIAGNOSIS — M25552 Pain in left hip: Secondary | ICD-10-CM | POA: Diagnosis not present

## 2024-03-23 DIAGNOSIS — M5416 Radiculopathy, lumbar region: Secondary | ICD-10-CM | POA: Diagnosis not present

## 2024-03-23 DIAGNOSIS — M79645 Pain in left finger(s): Secondary | ICD-10-CM | POA: Diagnosis not present

## 2024-03-23 DIAGNOSIS — M5432 Sciatica, left side: Secondary | ICD-10-CM | POA: Diagnosis not present

## 2024-03-27 DIAGNOSIS — M79645 Pain in left finger(s): Secondary | ICD-10-CM | POA: Diagnosis not present

## 2024-03-27 DIAGNOSIS — M13842 Other specified arthritis, left hand: Secondary | ICD-10-CM | POA: Diagnosis not present

## 2024-04-23 ENCOUNTER — Telehealth (HOSPITAL_BASED_OUTPATIENT_CLINIC_OR_DEPARTMENT_OTHER): Payer: BC Managed Care – PPO | Admitting: Psychiatry

## 2024-04-23 ENCOUNTER — Encounter (HOSPITAL_COMMUNITY): Payer: Self-pay | Admitting: Psychiatry

## 2024-04-23 DIAGNOSIS — F330 Major depressive disorder, recurrent, mild: Secondary | ICD-10-CM | POA: Diagnosis not present

## 2024-04-23 DIAGNOSIS — F419 Anxiety disorder, unspecified: Secondary | ICD-10-CM

## 2024-04-23 MED ORDER — DULOXETINE HCL 60 MG PO CPEP
60.0000 mg | ORAL_CAPSULE | Freq: Every day | ORAL | 1 refills | Status: DC
Start: 2024-04-23 — End: 2024-10-24

## 2024-04-23 NOTE — Progress Notes (Signed)
 [facility] MD Virtual Progress Note   Patient Location: Work Provider Location: Home Office  I connect with patient by video and verified that I am speaking with correct person by using two identifiers. I discussed the limitations of evaluation and management by telemedicine and the availability of in person appointments. I also discussed with the patient that there may be a patient responsible charge related to this service. The patient expressed understanding and agreed to proceed.  Stephanie Gray [id number] 58 y.o.  04/23/2024 2:10 PM  History of Present Illness:  Patient is evaluated by video session.  She was last seen in November 2024.  Patient told after the loss of the mother she wanted to spend time in [place]  with the horses.  She stayed there for 2 months and everything was going well until March she find out one of her trainer dead in the sleep.  Patient told he was only 60 year old but has some cardiac issues.  Patient told she gone through with depression but now slowly and gradually coming back to her normal life.  Patient told both husband and right had work with her for many years and wife is still working.  Patient reported sleep is good denies any major panic attack, suicidal thoughts.  Patient had a trip to [place] for her son's wedding and she really enjoyed the trip.  Patient reported taking care of the mother for many months had mildly tired but now slowly and gradually she is trying to focus on her general health.  She is trying to do horse ride frequently and that really helps her a lot.  Patient also had some concern about medication side effects as she read about long-term effect of dementia.  She do not recall any memory issues or forgetfulness.  However she remember her 40 year old father diagnosed with dementia.  Patient did not reported any concerns from the Cymbalta .  She actually feels helping her anxiety and depression.  She has no suicidal  thoughts, feeling of hopelessness or worthlessness.  Since she had changed her course who has issues on his left leg she is doing better horse riding and her own left leg is not bothering her.  She is not taking any narcotic pain medication.  Her appetite is okay.  Her weight is stable.  Past Psychiatric History: H/O depression and anxiety. Taking meds since her son  born.  Took Paxil, Zoloft and propanolol for anxiety.  Lamictal caused rash. No h/o inpatient or suicidal. H/O episodic hypomanic-like symptoms as more energy and poor sleep but able to function. No h/o paranoia and hallucination.  Saw Renetta Carter for 10 years.    Outpatient Encounter Medications as of 04/23/2024  Medication Sig   acetaminophen  (TYLENOL ) 500 MG tablet Take 2 tablets (1,000 mg total) by mouth every 6 (six) hours as needed for mild pain or fever. (Patient taking differently: Take 500 mg by mouth every 6 (six) hours as needed for mild pain or fever.)   amLODipine -valsartan  (EXFORGE ) 5-160 MG tablet Take 1 tablet by mouth daily.   ARMOUR THYROID  30 MG tablet Take 30 mg by mouth daily before breakfast.   B-12 METHYLCOBALAMIN PO Take by mouth. B 12 DAILY   cholecalciferol (VITAMIN D3) 25 MCG (1000 UNIT) tablet Take 1,000 Units by mouth daily.   co-enzyme Q-10 30 MG capsule Take 30 mg by mouth daily.   DULoxetine  (CYMBALTA ) 60 MG capsule Take 1 capsule (60 mg total) by mouth daily.   Omega-3 Fatty Acids (FISH  OIL) 435 MG CAPS Take by mouth.   oxyCODONE -acetaminophen  (PERCOCET/ROXICET) 5-325 MG tablet Take 1-2 tablets by mouth every 6 (six) hours as needed for severe pain (DO NOT TAKE W TYLENOL ).   polyethylene glycol (MIRALAX  / GLYCOLAX ) 17 g packet Take 17 g by mouth daily as needed for mild constipation.   PRESCRIPTION MEDICATION SQ ESTROGEN IMPLANT CHANGE EVERY 3 MONTHS   progesterone  (PROMETRIUM ) 100 MG capsule Take 100 mg by mouth at bedtime.   Trolamine Salicylate (BLUE-EMU HEMP EX) Take 1 tablet by mouth daily.  Hemp muscadine   No facility-administered encounter medications on file as of 04/23/2024.    No results found for this or any previous visit (from the past 2160 hours).   Psychiatric Specialty Exam: Physical Exam  Review of Systems  Weight 150 lb (68 kg), last menstrual period 06/19/2020.There is no height or weight on file to calculate BMI.  General Appearance: Casual  Eye Contact:  Good  Speech:  Clear and Coherent and Normal Rate  Volume:  Normal  Mood:  Euthymic  Affect:  Appropriate  Thought Process:  Goal Directed  Orientation:  Full (Time, Place, and Person)  Thought Content:  WDL  Suicidal Thoughts:  No  Homicidal Thoughts:  No  Memory:  Immediate;   Good Recent;   Good Remote;   Good  Judgement:  Good  Insight:  Good  Psychomotor Activity:  Normal  Concentration:  Concentration: Good and Attention Span: Good  Recall:  Good  Fund of Knowledge:  Good  Language:  Good  Akathisia:  No  Handed:  Right  AIMS (if indicated):     Assets:  Communication Skills Desire for Improvement Financial Resources/Insurance Housing Resilience Social Support Talents/Skills Transportation  ADL's:  Intact  Cognition:  WNL  Sleep: Good       10/19/2022    1:32 PM 08/14/2021   10:07 AM  Depression screen PHQ 2/9  Decreased Interest 0 0  Down, Depressed, Hopeless 0 0  PHQ - 2 Score 0 0    Assessment/Plan: Anxiety - Plan: DULoxetine  (CYMBALTA ) 60 MG capsule  MDD (major depressive disorder), recurrent episode, mild (HCC) - Plan: DULoxetine  (CYMBALTA ) 60 MG capsule  Discussed psychosocial especially recent loss of one of her trainer who died in sleep and she has to call 911.  She is recovering after that depressive episode.  We discussed about long-term effect of antianxiety and antidepressive medications.  Discussed Cymbalta  dosage.  She is not having any side effects.  I encouraged if she is concerned that may need to consider a neurology appointment to have a memory testing  but patient does not feel it is needed as does not have any concerns at this time.  I did mention to closely monitor her symptoms and if she noticed or someone else noticed issue with attention concentration then at that time may be appropriate to get a neurology consult.  Will continue Cymbalta  60 mg daily which is helping her depression and anxiety.  Follow-up in 6 months or sooner if needed.   Follow Up Instructions:     I discussed the assessment and treatment plan with the patient. The patient was provided an opportunity to ask questions and all were answered. The patient agreed with the plan and demonstrated an understanding of the instructions.   The patient was advised to call back or seek an in-person evaluation if the symptoms worsen or if the condition fails to improve as anticipated.    Collaboration of Care: Other provider  involved in patient's care AEB notes are available in epic to review  Patient/Guardian was advised Release of Information must be obtained prior to any record release in order to collaborate their care with an outside provider. Patient/Guardian was advised if they have not already done so to contact the registration department to sign all necessary forms in order for us  to release information regarding their care.   Consent: Patient/Guardian gives verbal consent for treatment and assignment of benefits for services provided during this visit. Patient/Guardian expressed understanding and agreed to proceed.     Total encounter time 25 minutes which includes face-to-face time, chart reviewed, care coordination, order entry and documentation during this encounter.   Note: This document was prepared by [employer] voice dictation technology and any errors that results from this process are unintentional.    Arturo Late, MD 04/23/2024

## 2024-05-07 DIAGNOSIS — J45901 Unspecified asthma with (acute) exacerbation: Secondary | ICD-10-CM | POA: Diagnosis not present

## 2024-07-05 DIAGNOSIS — W540XXA Bitten by dog, initial encounter: Secondary | ICD-10-CM | POA: Diagnosis not present

## 2024-07-05 DIAGNOSIS — S61451A Open bite of right hand, initial encounter: Secondary | ICD-10-CM | POA: Diagnosis not present

## 2024-07-13 DIAGNOSIS — W540XXA Bitten by dog, initial encounter: Secondary | ICD-10-CM | POA: Diagnosis not present

## 2024-07-13 DIAGNOSIS — S61451A Open bite of right hand, initial encounter: Secondary | ICD-10-CM | POA: Diagnosis not present

## 2024-07-16 DIAGNOSIS — R5383 Other fatigue: Secondary | ICD-10-CM | POA: Diagnosis not present

## 2024-07-16 DIAGNOSIS — R7303 Prediabetes: Secondary | ICD-10-CM | POA: Diagnosis not present

## 2024-07-16 DIAGNOSIS — R7989 Other specified abnormal findings of blood chemistry: Secondary | ICD-10-CM | POA: Diagnosis not present

## 2024-07-16 DIAGNOSIS — E039 Hypothyroidism, unspecified: Secondary | ICD-10-CM | POA: Diagnosis not present

## 2024-07-16 DIAGNOSIS — E559 Vitamin D deficiency, unspecified: Secondary | ICD-10-CM | POA: Diagnosis not present

## 2024-07-16 DIAGNOSIS — E612 Magnesium deficiency: Secondary | ICD-10-CM | POA: Diagnosis not present

## 2024-08-02 ENCOUNTER — Telehealth: Payer: Self-pay | Admitting: *Deleted

## 2024-08-02 NOTE — Telephone Encounter (Signed)
   Pre-operative Risk Assessment    Patient Name: Stephanie Gray  DOB: [birth year] MRN: [id number]   Date of last office visit: 11/20/21 DR. MCLEAN  Date of next office visit: NONE   Request for Surgical Clearance    Procedure:  COLONOSCOPY  Date of Surgery:  Clearance 08/15/24                                Surgeon:  DR. MANN Surgeon's Group or Practice Name:  [facility] Phone number:  [phone] Fax number:  [phone]   Type of Clearance Requested:   - Medical ; NONE INDICATED ON FORM TO BE HELD   Type of Anesthesia:  PROPOFOL    Additional requests/questions:    Bonney Niels Jest   08/02/2024, 5:05 PM

## 2024-08-03 NOTE — Telephone Encounter (Signed)
   Patient Name: Stephanie Gray  DOB: [birth year] MRN: [id number]  Primary Cardiologist: Wilbert Bihari, MD  Chart reviewed as part of pre-operative protocol coverage.  Patient require an in-office visit for preoperative cardiac evaluation.  She was last seen in 2022 by Dr. Rolan.  Follow-up was recommended as needed.  She may see either the advanced heart failure clinic or general cardiology for preoperative cardiac evaluation.  Stephanie JAYSON Braver, NP 08/03/2024, 12:38 PM

## 2024-08-03 NOTE — Telephone Encounter (Signed)
 I s/w the GI office to inform them the pt is going to need an appt in office, see previous notes.   GI office verbalized understanding.   I then called the pt and she tells me that she does not have any cardiac issues and only was seen due to family history, had CT which she said was normal. Pt asked should she call Dr. Kristie and explain this to her. I said maybe that might be a good idea. I did tell the pt that we did not ask for the clearance request, but that it was sent to our office by Dr. Kristie.   I stated to the pt that I will make a note of our conversation as well. I did tell her if Dr. Kristie still wants clearance then she will need to call back and we will need to schedule a new pt appt with gen card.   I will also update the preop APP as well.

## 2024-08-03 NOTE — Telephone Encounter (Signed)
   Name: Stephanie Gray  DOB: [birth year]  MRN: [id number]  Primary Cardiologist: Wilbert Bihari, MD  Chart reviewed as part of pre-operative protocol coverage. Because of Stephanie Gray's past medical history and time since last visit, she will require a follow-up in-office visit in order to better assess preoperative cardiovascular risk. Has not been seen since 11/20/2021 by Dr. Rolan, and 11/21/2019 by Dr. Bihari.  Pre-op covering staff: - Please schedule appointment and call patient to inform them. If patient already had an upcoming appointment within acceptable timeframe, please add pre-op clearance to the appointment notes so provider is aware. - Please contact requesting surgeon's office via preferred method (i.e, phone, fax) to inform them of need for appointment prior to surgery.    Lamarr Satterfield, NP  08/03/2024, 10:26 AM

## 2024-08-13 DIAGNOSIS — Z01419 Encounter for gynecological examination (general) (routine) without abnormal findings: Secondary | ICD-10-CM | POA: Diagnosis not present

## 2024-08-13 DIAGNOSIS — L900 Lichen sclerosus et atrophicus: Secondary | ICD-10-CM | POA: Diagnosis not present

## 2024-08-13 DIAGNOSIS — R928 Other abnormal and inconclusive findings on diagnostic imaging of breast: Secondary | ICD-10-CM | POA: Diagnosis not present

## 2024-08-14 ENCOUNTER — Other Ambulatory Visit: Payer: Self-pay | Admitting: Obstetrics and Gynecology

## 2024-08-14 DIAGNOSIS — Z09 Encounter for follow-up examination after completed treatment for conditions other than malignant neoplasm: Secondary | ICD-10-CM

## 2024-08-14 DIAGNOSIS — N6310 Unspecified lump in the right breast, unspecified quadrant: Secondary | ICD-10-CM

## 2024-08-15 DIAGNOSIS — K626 Ulcer of anus and rectum: Secondary | ICD-10-CM | POA: Diagnosis not present

## 2024-08-15 DIAGNOSIS — Z860100 Personal history of colon polyps, unspecified: Secondary | ICD-10-CM | POA: Diagnosis not present

## 2024-08-15 DIAGNOSIS — K6289 Other specified diseases of anus and rectum: Secondary | ICD-10-CM | POA: Diagnosis not present

## 2024-08-15 DIAGNOSIS — Z1211 Encounter for screening for malignant neoplasm of colon: Secondary | ICD-10-CM | POA: Diagnosis not present

## 2024-08-15 DIAGNOSIS — K633 Ulcer of intestine: Secondary | ICD-10-CM | POA: Diagnosis not present

## 2024-10-24 ENCOUNTER — Telehealth (HOSPITAL_COMMUNITY): Admitting: Psychiatry

## 2024-10-24 ENCOUNTER — Encounter (HOSPITAL_COMMUNITY): Payer: Self-pay | Admitting: Psychiatry

## 2024-10-24 VITALS — Wt 150.0 lb

## 2024-10-24 DIAGNOSIS — F330 Major depressive disorder, recurrent, mild: Secondary | ICD-10-CM | POA: Diagnosis not present

## 2024-10-24 DIAGNOSIS — F419 Anxiety disorder, unspecified: Secondary | ICD-10-CM | POA: Diagnosis not present

## 2024-10-24 MED ORDER — DULOXETINE HCL 60 MG PO CPEP
60.0000 mg | ORAL_CAPSULE | Freq: Every day | ORAL | 1 refills | Status: AC
Start: 2024-10-24 — End: ?

## 2024-10-24 NOTE — Progress Notes (Signed)
 [facility] MD Virtual Progress Note   Patient Location: Home Provider Location: Home Office  I connect with patient by telephone and verified that I am speaking with correct person by using two identifiers. I discussed the limitations of evaluation and management by telemedicine and the availability of in person appointments. I also discussed with the patient that there may be a patient responsible charge related to this service. The patient expressed understanding and agreed to proceed.  Stephanie Gray [id number] 58 y.o.  10/24/2024 9:55 AM  History of Present Illness:  Patient is evaluated by phone session.  She reported things are going much better.  She started taking female hormones and that is helping her brain fog, attention and sleep.  She feels more productive and more active.  She started exercising every day.  She enjoyed horse riding.  She denies any panic attack, crying spells or any feeling of hopelessness or worthlessness.  She feels the Cymbalta  helping her depression and she does not get overwhelmed.  She denies any suicidal thoughts or homicidal thoughts.  Her appetite is okay.  Her weight is stable.  She wants to continue Cymbalta  and denies any tremors, shakes or any EPS.  She is sleeping better with the help of hormones.  Past Psychiatric History: H/O depression and anxiety. Taking meds since her son  born.  Took Paxil, Zoloft and propanolol for anxiety.  Lamictal caused rash. No h/o inpatient or suicidal. H/O episodic hypomanic-like symptoms as more energy and poor sleep but able to function. No h/o paranoia and hallucination.  Saw Elodie Anon for 10 years.   Past Medical History:  Diagnosis Date   Anemia    after c section   Anxiety    Apocrine metaplasia of breast, right 03/03/2018   Arthritis    Atrial tachycardia, paroxysmal    noted on event monitor 02/2020   Breast mass, right 04/12/2017   Cervical disc disease    Depression    GERD  (gastroesophageal reflux disease)    Heart murmur    no current cardiologist   History of COVID-19 2020   History of HELLP syndrome, currently pregnant, third trimester 1997   Hypertension    hasnt taken medication since 01/09/2022 because bp was low   Hypothyroidism 03/17/2018   pr denies   Neuromuscular disorder (HCC)    numbness in 2 fingers of left hand   Perimenopausal disorder 03/17/2018   Pneumothorax 2023   Premature atrial contractions    PVC's (premature ventricular contractions)    noted on heart monitor 02/2020   Rib fracture 01/08/2022   right second, third and fourth ribs   Tubular adenoma of colon 04/27/2015   04/07/2015 @ 70 cm ,polyp; Dr Kristie Due 2019     Outpatient Encounter Medications as of 10/24/2024  Medication Sig   acetaminophen  (TYLENOL ) 500 MG tablet Take 2 tablets (1,000 mg total) by mouth every 6 (six) hours as needed for mild pain or fever. (Patient taking differently: Take 500 mg by mouth every 6 (six) hours as needed for mild pain or fever.)   amLODipine -valsartan  (EXFORGE ) 5-160 MG tablet Take 1 tablet by mouth daily.   ARMOUR THYROID  30 MG tablet Take 30 mg by mouth daily before breakfast.   B-12 METHYLCOBALAMIN PO Take by mouth. B 12 DAILY   cholecalciferol (VITAMIN D3) 25 MCG (1000 UNIT) tablet Take 1,000 Units by mouth daily.   co-enzyme Q-10 30 MG capsule Take 30 mg by mouth daily.   DULoxetine  (CYMBALTA )  60 MG capsule Take 1 capsule (60 mg total) by mouth daily.   Omega-3 Fatty Acids (FISH OIL) 435 MG CAPS Take by mouth.   polyethylene glycol (MIRALAX  / GLYCOLAX ) 17 g packet Take 17 g by mouth daily as needed for mild constipation.   PRESCRIPTION MEDICATION SQ ESTROGEN IMPLANT CHANGE EVERY 3 MONTHS   progesterone  (PROMETRIUM ) 100 MG capsule Take 100 mg by mouth at bedtime.   Trolamine Salicylate (BLUE-EMU HEMP EX) Take 1 tablet by mouth daily. Hemp muscadine   No facility-administered encounter medications on file as of 10/24/2024.    No  results found for this or any previous visit (from the past 2160 hours).   Psychiatric Specialty Exam: Physical Exam  Review of Systems  Weight 150 lb (68 kg), last menstrual period 06/19/2020.There is no height or weight on file to calculate BMI.  General Appearance: NA  Eye Contact:  NA  Speech:  Clear and Coherent and Normal Rate  Volume:  Normal  Mood:  Euthymic  Affect:  Appropriate  Thought Process:  Goal Directed  Orientation:  Full (Time, Place, and Person)  Thought Content:  Logical  Suicidal Thoughts:  No  Homicidal Thoughts:  No  Memory:  Immediate;   Good Recent;   Good Remote;   Good  Judgement:  Good  Insight:  Present  Psychomotor Activity:  NA  Concentration:  Concentration: Good and Attention Span: Good  Recall:  Good  Fund of Knowledge:  Good  Language:  Good  Akathisia:  No  Handed:  Right  AIMS (if indicated):     Assets:  Communication Skills Desire for Improvement Housing Talents/Skills Transportation  ADL's:  Intact  Cognition:  WNL  Sleep:  ok       10/19/2022    1:32 PM 08/14/2021   10:07 AM  Depression screen PHQ 2/9  Decreased Interest 0 0  Down, Depressed, Hopeless 0 0  PHQ - 2 Score 0 0    Assessment/Plan: MDD (major depressive disorder), recurrent episode, mild - Plan: DULoxetine  (CYMBALTA ) 60 MG capsule  Anxiety - Plan: DULoxetine  (CYMBALTA ) 60 MG capsule  Patient is 58 year old female with history of major depressive disorder and anxiety.  She is taking Cymbalta  60 mg which is helping her depression and anxiety.  She also started taking female hormone from OB/GYN is going through menopause which is helping her sleep, energy, motivation and brain fog.  Discussed medication side effects and benefits.  Recommend to call back if she is any question or any concern.  Follow-up in 6 months.  I encourage logging to MyChart for future video sessions.  She acknowledged and agree with the plan.   Follow Up Instructions:     I discussed  the assessment and treatment plan with the patient. The patient was provided an opportunity to ask questions and all were answered. The patient agreed with the plan and demonstrated an understanding of the instructions.   The patient was advised to call back or seek an in-person evaluation if the symptoms worsen or if the condition fails to improve as anticipated.    Collaboration of Care: Other provider involved in patient's care AEB notes are available in epic to review  Patient/Guardian was advised Release of Information must be obtained prior to any record release in order to collaborate their care with an outside provider. Patient/Guardian was advised if they have not already done so to contact the registration department to sign all necessary forms in order for us  to release information regarding  their care.   Consent: Patient/Guardian gives verbal consent for treatment and assignment of benefits for services provided during this visit. Patient/Guardian expressed understanding and agreed to proceed.     Total encounter time 16 minutes which includes face-to-face time, chart reviewed, care coordination, order entry and documentation during this encounter.   Note: This document was prepared by [employer] voice dictation technology and any errors that results from this process are unintentional.    Leni ONEIDA Client, MD 10/24/2024

## 2025-01-01 ENCOUNTER — Inpatient Hospital Stay: Admission: RE | Admit: 2025-01-01 | Payer: Self-pay | Source: Ambulatory Visit

## 2025-04-24 ENCOUNTER — Telehealth (HOSPITAL_COMMUNITY): Admitting: Psychiatry
